# Patient Record
Sex: Female | Born: 1945 | Race: White | Hispanic: No | Smoking: Former smoker
Health system: Southern US, Community
[De-identification: ages and names within clinical notes are randomized; demographics above are authoritative.]

## PROBLEM LIST (undated history)

## (undated) DIAGNOSIS — M1990 Unspecified osteoarthritis, unspecified site: Secondary | ICD-10-CM

## (undated) DIAGNOSIS — Z972 Presence of dental prosthetic device (complete) (partial): Secondary | ICD-10-CM

## (undated) DIAGNOSIS — E538 Deficiency of other specified B group vitamins: Secondary | ICD-10-CM

## (undated) DIAGNOSIS — F209 Schizophrenia, unspecified: Secondary | ICD-10-CM

## (undated) DIAGNOSIS — J449 Chronic obstructive pulmonary disease, unspecified: Secondary | ICD-10-CM

## (undated) DIAGNOSIS — K5901 Slow transit constipation: Secondary | ICD-10-CM

## (undated) DIAGNOSIS — M810 Age-related osteoporosis without current pathological fracture: Secondary | ICD-10-CM

## (undated) DIAGNOSIS — N6019 Diffuse cystic mastopathy of unspecified breast: Secondary | ICD-10-CM

## (undated) DIAGNOSIS — T753XXA Motion sickness, initial encounter: Secondary | ICD-10-CM

## (undated) DIAGNOSIS — Z9221 Personal history of antineoplastic chemotherapy: Secondary | ICD-10-CM

## (undated) DIAGNOSIS — C189 Malignant neoplasm of colon, unspecified: Secondary | ICD-10-CM

## (undated) DIAGNOSIS — E559 Vitamin D deficiency, unspecified: Secondary | ICD-10-CM

## (undated) DIAGNOSIS — J9859 Other diseases of mediastinum, not elsewhere classified: Secondary | ICD-10-CM

## (undated) HISTORY — PX: BREAST BIOPSY: SHX20

## (undated) HISTORY — PX: COLONOSCOPY: SHX174

## (undated) HISTORY — PX: TUBAL LIGATION: SHX77

---

## 2005-07-08 ENCOUNTER — Ambulatory Visit: Payer: Self-pay | Admitting: Internal Medicine

## 2006-07-21 ENCOUNTER — Ambulatory Visit: Payer: Self-pay | Admitting: Internal Medicine

## 2006-08-01 ENCOUNTER — Ambulatory Visit: Payer: Self-pay | Admitting: Gastroenterology

## 2007-06-05 ENCOUNTER — Ambulatory Visit: Payer: Self-pay | Admitting: Nurse Practitioner

## 2008-02-22 ENCOUNTER — Ambulatory Visit: Payer: Self-pay | Admitting: Internal Medicine

## 2008-06-18 ENCOUNTER — Ambulatory Visit: Payer: Self-pay | Admitting: Family Medicine

## 2009-03-23 ENCOUNTER — Ambulatory Visit: Payer: Self-pay | Admitting: Family Medicine

## 2009-07-22 ENCOUNTER — Ambulatory Visit: Payer: Self-pay | Admitting: Family Medicine

## 2009-09-11 ENCOUNTER — Ambulatory Visit: Payer: Self-pay | Admitting: Gastroenterology

## 2010-03-26 ENCOUNTER — Ambulatory Visit: Payer: Self-pay | Admitting: Family Medicine

## 2010-03-28 ENCOUNTER — Ambulatory Visit: Payer: Self-pay | Admitting: Family Medicine

## 2011-02-27 ENCOUNTER — Ambulatory Visit: Payer: Self-pay | Admitting: Family Medicine

## 2011-03-28 ENCOUNTER — Ambulatory Visit: Payer: Self-pay | Admitting: Family Medicine

## 2012-03-31 ENCOUNTER — Ambulatory Visit: Payer: Self-pay | Admitting: Family Medicine

## 2013-04-06 ENCOUNTER — Ambulatory Visit: Payer: Self-pay | Admitting: Family Medicine

## 2013-04-13 ENCOUNTER — Ambulatory Visit: Payer: Self-pay | Admitting: Family Medicine

## 2013-05-04 ENCOUNTER — Ambulatory Visit: Payer: Self-pay | Admitting: Surgery

## 2013-05-04 HISTORY — PX: BREAST BIOPSY: SHX20

## 2013-11-09 ENCOUNTER — Ambulatory Visit: Payer: Self-pay | Admitting: Surgery

## 2014-02-16 ENCOUNTER — Ambulatory Visit: Payer: Self-pay | Admitting: Family Medicine

## 2014-04-07 ENCOUNTER — Ambulatory Visit: Payer: Self-pay | Admitting: Family Medicine

## 2014-09-05 ENCOUNTER — Ambulatory Visit: Payer: Self-pay | Admitting: Family Medicine

## 2014-09-05 LAB — CREATININE, SERUM: CREATININE: 0.74 mg/dL (ref 0.60–1.30)

## 2014-11-09 HISTORY — PX: THORACOSCOPY: SUR1347

## 2014-11-09 HISTORY — PX: MEDIASTINAL MASS EXCISION: SHX2025

## 2015-03-06 ENCOUNTER — Other Ambulatory Visit: Payer: Self-pay | Admitting: Family Medicine

## 2015-03-06 DIAGNOSIS — Z1231 Encounter for screening mammogram for malignant neoplasm of breast: Secondary | ICD-10-CM

## 2015-04-11 ENCOUNTER — Ambulatory Visit
Admission: RE | Admit: 2015-04-11 | Discharge: 2015-04-11 | Disposition: A | Discharge status: Home / Self Care | Payer: Medicare Other | Source: Ambulatory Visit | Attending: Family Medicine | Admitting: Family Medicine

## 2015-04-11 DIAGNOSIS — Z1231 Encounter for screening mammogram for malignant neoplasm of breast: Secondary | ICD-10-CM | POA: Diagnosis present

## 2016-03-07 ENCOUNTER — Other Ambulatory Visit: Payer: Self-pay | Admitting: Family Medicine

## 2016-03-07 DIAGNOSIS — Z1231 Encounter for screening mammogram for malignant neoplasm of breast: Secondary | ICD-10-CM

## 2016-03-07 DIAGNOSIS — M810 Age-related osteoporosis without current pathological fracture: Secondary | ICD-10-CM

## 2016-04-11 ENCOUNTER — Ambulatory Visit: Payer: Medicare Other

## 2016-04-22 ENCOUNTER — Ambulatory Visit
Admission: RE | Admit: 2016-04-22 | Discharge: 2016-04-22 | Disposition: A | Discharge status: Home / Self Care | Payer: Medicare Other | Source: Ambulatory Visit | Attending: Family Medicine | Admitting: Family Medicine

## 2016-04-22 DIAGNOSIS — M810 Age-related osteoporosis without current pathological fracture: Secondary | ICD-10-CM | POA: Insufficient documentation

## 2016-04-22 DIAGNOSIS — M85851 Other specified disorders of bone density and structure, right thigh: Secondary | ICD-10-CM | POA: Diagnosis not present

## 2016-04-22 DIAGNOSIS — Z780 Asymptomatic menopausal state: Secondary | ICD-10-CM | POA: Insufficient documentation

## 2016-04-22 DIAGNOSIS — Z1231 Encounter for screening mammogram for malignant neoplasm of breast: Secondary | ICD-10-CM | POA: Diagnosis not present

## 2016-11-14 ENCOUNTER — Encounter: Payer: Self-pay | Admitting: *Deleted

## 2016-11-15 NOTE — Discharge Instructions (Signed)
[facility] °[facility] ° °POST OPERATIVE INSTRUCTIONS FOR DR. TROXLER AND DR. FOWLER °[facility] ° ° °1. Take your medication as prescribed.  Pain medication should be taken only as needed. ° °2. Keep the dressing clean, dry and intact. ° °3. Keep your foot elevated above the heart level for the first 48 hours. ° °4. Walking to the bathroom and brief periods of walking are acceptable, unless we have instructed you to be non-weight bearing. ° °5. Always wear your post-op shoe when walking.  Always use your crutches if you are to be non-weight bearing. ° °6. Do not take a shower. Baths are permissible as long as the foot is kept out of the water.  ° °7. Every hour you are awake:  °- Bend your knee 15 times. °- Flex foot 15 times °- Massage calf 15 times ° °8. Call [facility] ([phone]) if any of the following problems occur: °- You develop a temperature or fever. °- The bandage becomes saturated with blood. °- Medication does not stop your pain. °- Injury of the foot occurs. °- Any symptoms of infection including redness, odor, or red streaks running from wound. ° ° °General Anesthesia, Adult, Care After °These instructions provide you with information about caring for yourself after your procedure. Your health care provider may also give you more specific instructions. Your treatment has been planned according to current medical practices, but problems sometimes occur. Call your health care provider if you have any problems or questions after your procedure. °What can I expect after the procedure? °After the procedure, it is common to have: °· Vomiting. °· A sore throat. °· Mental slowness. °It is common to feel: °· Nauseous. °· Cold or shivery. °· Sleepy. °· Tired. °· Sore or achy, even in parts of your body where you did not have surgery. °Follow these instructions at home: °For at least 24 hours after the procedure: °· Do not: °¨ Participate in  activities where you could fall or become injured. °¨ Drive. °¨ Use heavy machinery. °¨ Drink alcohol. °¨ Take sleeping pills or medicines that cause drowsiness. °¨ Make important decisions or sign legal documents. °¨ Take care of children on your own. °· Rest. °Eating and drinking °· If you vomit, drink water, juice, or soup when you can drink without vomiting. °· Drink enough fluid to keep your urine clear or pale yellow. °· Make sure you have little or no nausea before eating solid foods. °· Follow the diet recommended by your health care provider. °General instructions °· Have a responsible adult stay with you until you are awake and alert. °· Return to your normal activities as told by your health care provider. Ask your health care provider what activities are safe for you. °· Take over-the-counter and prescription medicines only as told by your health care provider. °· If you smoke, do not smoke without supervision. °· Keep all follow-up visits as told by your health care provider. This is important. °Contact a health care provider if: °· You continue to have nausea or vomiting at home, and medicines are not helpful. °· You cannot drink fluids or start eating again. °· You cannot urinate after 8-12 hours. °· You develop a skin rash. °· You have fever. °· You have increasing redness at the site of your procedure. °Get help right away if: °· You have difficulty breathing. °· You have chest pain. °· You have unexpected bleeding. °· You feel that you are having   a life-threatening or urgent problem. °This information is not intended to replace advice given to you by your health care provider. Make sure you discuss any questions you have with your health care provider. °Document Released: 01/13/2001 Document Revised: 03/11/2016 Document Reviewed: 09/21/2015 °Elsevier Interactive Patient Education © 2017 [employer] ° °

## 2016-11-20 ENCOUNTER — Encounter
Admission: RE | Disposition: A | Discharge status: Home / Self Care | Payer: Self-pay | Source: Ambulatory Visit | Attending: Podiatry

## 2016-11-20 ENCOUNTER — Ambulatory Visit: Payer: Medicare Other | Admitting: Anesthesiology

## 2016-11-20 ENCOUNTER — Ambulatory Visit
Admission: RE | Admit: 2016-11-20 | Discharge: 2016-11-20 | Disposition: A | Discharge status: Home / Self Care | Payer: Medicare Other | Source: Ambulatory Visit | Attending: Podiatry | Admitting: Podiatry

## 2016-11-20 DIAGNOSIS — Z79899 Other long term (current) drug therapy: Secondary | ICD-10-CM | POA: Insufficient documentation

## 2016-11-20 DIAGNOSIS — F209 Schizophrenia, unspecified: Secondary | ICD-10-CM | POA: Insufficient documentation

## 2016-11-20 DIAGNOSIS — Z87891 Personal history of nicotine dependence: Secondary | ICD-10-CM | POA: Insufficient documentation

## 2016-11-20 DIAGNOSIS — M2011 Hallux valgus (acquired), right foot: Secondary | ICD-10-CM | POA: Insufficient documentation

## 2016-11-20 DIAGNOSIS — Z7982 Long term (current) use of aspirin: Secondary | ICD-10-CM | POA: Diagnosis not present

## 2016-11-20 HISTORY — DX: Unspecified osteoarthritis, unspecified site: M19.90

## 2016-11-20 HISTORY — PX: HALLUX VALGUS AKIN: SHX6622

## 2016-11-20 HISTORY — DX: Presence of dental prosthetic device (complete) (partial): Z97.2

## 2016-11-20 HISTORY — DX: Age-related osteoporosis without current pathological fracture: M81.0

## 2016-11-20 HISTORY — DX: Other diseases of mediastinum, not elsewhere classified: J98.59

## 2016-11-20 HISTORY — DX: Motion sickness, initial encounter: T75.3XXA

## 2016-11-20 HISTORY — PX: HALLUX VALGUS AUSTIN: SHX6623

## 2016-11-20 HISTORY — DX: Schizophrenia, unspecified: F20.9

## 2016-11-20 SURGERY — CORRECTION, HALLUX VALGUS
Anesthesia: Regional | Site: Foot | Laterality: Right | Wound class: Clean

## 2016-11-20 MED ORDER — EPHEDRINE SULFATE 50 MG/ML IJ SOLN
INTRAMUSCULAR | Status: DC | PRN
Start: 2016-11-20 — End: 2016-11-20
  Administered 2016-11-20 (×2): 5 mg via INTRAVENOUS

## 2016-11-20 MED ORDER — LIDOCAINE HCL (CARDIAC) 20 MG/ML IV SOLN
INTRAVENOUS | Status: DC | PRN
  Administered 2016-11-20: 40 mg via INTRATRACHEAL

## 2016-11-20 MED ORDER — ONDANSETRON HCL 4 MG/2ML IJ SOLN: 4.0000 mg | Freq: Four times a day (QID) | INTRAMUSCULAR | Status: DC | PRN

## 2016-11-20 MED ORDER — DEXAMETHASONE SODIUM PHOSPHATE 4 MG/ML IJ SOLN
INTRAMUSCULAR | Status: DC | PRN
  Administered 2016-11-20: 4 mg via INTRAVENOUS

## 2016-11-20 MED ORDER — FENTANYL CITRATE (PF) 100 MCG/2ML IJ SOLN
INTRAMUSCULAR | Status: DC | PRN
  Administered 2016-11-20: 100 ug via INTRAVENOUS

## 2016-11-20 MED ORDER — OXYCODONE-ACETAMINOPHEN 5-325 MG PO TABS: 1.0000 | ORAL_TABLET | ORAL | Status: DC | PRN

## 2016-11-20 MED ORDER — MIDAZOLAM HCL 2 MG/2ML IJ SOLN
INTRAMUSCULAR | Status: DC | PRN
  Administered 2016-11-20: 1 mg via INTRAVENOUS
  Administered 2016-11-20: 2 mg via INTRAVENOUS

## 2016-11-20 MED ORDER — BUPIVACAINE HCL (PF) 0.25 % IJ SOLN
INTRAMUSCULAR | Status: DC | PRN
  Administered 2016-11-20: 9 mL

## 2016-11-20 MED ORDER — HYDROCODONE-ACETAMINOPHEN 5-325 MG PO TABS: 1.0000 | ORAL_TABLET | Freq: Four times a day (QID) | ORAL | 0 refills | Status: DC | PRN

## 2016-11-20 MED ORDER — LACTATED RINGERS IV SOLN
INTRAVENOUS | Status: DC
  Administered 2016-11-20: 13:00:00 via INTRAVENOUS

## 2016-11-20 MED ORDER — ONDANSETRON HCL 4 MG/2ML IJ SOLN
INTRAMUSCULAR | Status: DC | PRN
  Administered 2016-11-20: 4 mg via INTRAVENOUS

## 2016-11-20 MED ORDER — ROPIVACAINE HCL 5 MG/ML IJ SOLN
INTRAMUSCULAR | Status: DC | PRN
  Administered 2016-11-20: 40 mL

## 2016-11-20 MED ORDER — DEXTROSE 5 % IV SOLN
600.0000 mg | Freq: Once | INTRAVENOUS | Status: AC
  Administered 2016-11-20: 600 mg via INTRAVENOUS

## 2016-11-20 MED ORDER — ONDANSETRON HCL 4 MG PO TABS: 4.0000 mg | ORAL_TABLET | Freq: Four times a day (QID) | ORAL | Status: DC | PRN

## 2016-11-20 MED ORDER — GLYCOPYRROLATE 0.2 MG/ML IJ SOLN
INTRAMUSCULAR | Status: DC | PRN
  Administered 2016-11-20: 0.1 mg via INTRAVENOUS

## 2016-11-20 MED ORDER — PROPOFOL 10 MG/ML IV BOLUS
INTRAVENOUS | Status: DC | PRN
  Administered 2016-11-20: 140 mg via INTRAVENOUS

## 2016-11-20 SURGICAL SUPPLY — 48 items
BANDAGE ELASTIC 4 VELCRO NS (GAUZE/BANDAGES/DRESSINGS) ×2 IMPLANT
BENZOIN TINCTURE PRP APPL 2/3 (GAUZE/BANDAGES/DRESSINGS) ×2 IMPLANT
BLADE MED AGGRESSIVE (BLADE) ×2 IMPLANT
BLADE OSC/SAGITTAL MD 5.5X18 (BLADE) IMPLANT
BLADE OSC/SAGITTAL MD 9X18.5 (BLADE) ×2 IMPLANT
BLADE SURG 15 STRL LF DISP TIS (BLADE) IMPLANT
BLADE SURG 15 STRL SS (BLADE)
BNDG COHESIVE 4X5 TAN STRL (GAUZE/BANDAGES/DRESSINGS) ×2 IMPLANT
BNDG ESMARK 4X12 TAN STRL LF (GAUZE/BANDAGES/DRESSINGS) ×2 IMPLANT
BNDG GAUZE 4.5X4.1 6PLY STRL (MISCELLANEOUS) ×2 IMPLANT
BNDG STRETCH 4X75 STRL LF (GAUZE/BANDAGES/DRESSINGS) ×2 IMPLANT
CANISTER SUCT 1200ML W/VALVE (MISCELLANEOUS) ×2 IMPLANT
COVER LIGHT HANDLE UNIVERSAL (MISCELLANEOUS) ×4 IMPLANT
CUFF TOURN SGL QUICK 18 (TOURNIQUET CUFF) IMPLANT
DRAPE FLUOR MINI C-ARM 54X84 (DRAPES) ×2 IMPLANT
DURAPREP 26ML APPLICATOR (WOUND CARE) ×2 IMPLANT
GAUZE PETRO XEROFOAM 1X8 (MISCELLANEOUS) ×2 IMPLANT
GAUZE SPONGE 4X4 12PLY STRL (GAUZE/BANDAGES/DRESSINGS) ×2 IMPLANT
GLOVE BIO SURGEON STRL SZ7.5 (GLOVE) ×4 IMPLANT
GLOVE INDICATOR 8.0 STRL GRN (GLOVE) ×4 IMPLANT
GOWN STRL REUS W/ TWL LRG LVL3 (GOWN DISPOSABLE) ×2 IMPLANT
GOWN STRL REUS W/TWL LRG LVL3 (GOWN DISPOSABLE) ×2
K-WIRE (Wire) ×2 IMPLANT
K-WIRE DBL END TROCAR 6X.045 (WIRE) ×2
K-WIRE DBL END TROCAR 6X.062 (WIRE) ×2
KIT ROOM TURNOVER OR (KITS) ×2 IMPLANT
KWIRE DBL END TROCAR 6X.045 (WIRE) ×1 IMPLANT
KWIRE DBL END TROCAR 6X.062 (WIRE) ×1 IMPLANT
NS IRRIG 500ML POUR BTL (IV SOLUTION) ×2 IMPLANT
PACK EXTREMITY ARMC (MISCELLANEOUS) ×2 IMPLANT
PAD GROUND ADULT SPLIT (MISCELLANEOUS) ×2 IMPLANT
PIN BALLS 3/8 F/.045 WIRE (MISCELLANEOUS) IMPLANT
RASP SM TEAR CROSS CUT (RASP) IMPLANT
STAPLE SPR MET 9X9X9 1.5 (Staple) ×2 IMPLANT
STOCKINETTE IMPERVIOUS LG (DRAPES) ×2 IMPLANT
STRAP BODY AND KNEE 60X3 (MISCELLANEOUS) ×2 IMPLANT
STRIP CLOSURE SKIN 1/4X4 (GAUZE/BANDAGES/DRESSINGS) ×2 IMPLANT
SUT ETHILON 4-0 (SUTURE)
SUT ETHILON 4-0 FS2 18XMFL BLK (SUTURE)
SUT ETHILON 5-0 FS-2 18 BLK (SUTURE) IMPLANT
SUT MNCRL 5-0+ PC-1 (SUTURE) ×1 IMPLANT
SUT MONOCRYL 5-0 (SUTURE) ×1
SUT VIC AB 2-0 SH 27 (SUTURE)
SUT VIC AB 2-0 SH 27XBRD (SUTURE) IMPLANT
SUT VIC AB 3-0 SH 27 (SUTURE)
SUT VIC AB 3-0 SH 27X BRD (SUTURE) IMPLANT
SUT VIC AB 4-0 FS2 27 (SUTURE) ×2 IMPLANT
SUTURE ETHLN 4-0 FS2 18XMF BLK (SUTURE) IMPLANT

## 2016-11-20 NOTE — Op Note (Signed)
Operative note   Surgeon:Samit Sylve Lawyer: None    Preop diagnosis: 1 hallux valgus right foot    Postop diagnosis: Hallux valgus right foot    Procedure: Austin with Akin double osteotomy hallux valgus correction    EBL: Minimal    Anesthesia:local and general    Hemostasis: Midcalf tourniquet inflated to 200 mmHg for approximately 40 minutes    Specimen: None    Complications: None    Operative indications:Kathy [name] is an 71 y.o. that presents today for surgical intervention.  The risks/benefits/alternatives/complications have been discussed and consent has been given.    Procedure:  Patient was brought into the OR and placed on the operating table in thesupine position. After anesthesia was obtained theright lower extremity was prepped and draped in usual sterile fashion.  Attention was directed to the dorsomedial right foot where a longitudinal incision made overlying the dorsal medial first MTPJ. Sharp and blunt dissection carried down to the capsule. A T capsulotomy was performed. The prominent dorsal medial eminence was noted and transected with a power saw. A V osteotomy was created. The head of the metatarsal was translated laterally. This was stabilized with a 0.062 K wire. The ensuing overhanging ledge was then transected and all areas were smoothed with a power rasp. At this time there was no debris residual valgus of the great toe. Attention was directed to the proximal phalanx where a small wedge with the apex lateral was placed into the proximal phalanx. The small wedge of bone was then resected and removed from the surgical field. This was then stabilized with a small bone staple. Good alignment was noted in all planes at this time. The wound was flushed with copious amounts or irrigation. Layered closure was performed with 3-0 Vicryl for the capsule. 4-0 Vicryl for the subcutaneous tissue and a 5-0 Monocryl undyed for skin. 0.25% Marcaine was placed  around all areas. Layered dressing was then applied.    Patient tolerated the procedure and anesthesia well.  Was transported from the OR to the PACU with all vital signs stable and vascular status intact. To be discharged per routine protocol.  Will follow up in approximately 1 week in the outpatient clinic.

## 2016-11-20 NOTE — Anesthesia Procedure Notes (Signed)
Procedure Name: LMA Insertion Date/Time: 11/20/2016 2:57 PM Performed by: Mayme Genta Pre-anesthesia Checklist: Patient identified, Emergency Drugs available, Suction available, Timeout performed and Patient being monitored Patient Re-evaluated:Patient Re-evaluated prior to inductionOxygen Delivery Method: Circle system utilized Preoxygenation: Pre-oxygenation with 100% oxygen Intubation Type: IV induction LMA: LMA inserted LMA Size: 4.0 Number of attempts: 1 Placement Confirmation: positive ETCO2 and breath sounds checked- equal and bilateral Tube secured with: Tape

## 2016-11-20 NOTE — Anesthesia Postprocedure Evaluation (Signed)
Anesthesia Post Note  Patient: Kathy [name]  Procedure(s) Performed: Procedure(s) (LRB): HALLUX VALGUS AUSTIN  Alroy Dust) right (Right) HALLUX VALGUS AKIN (Right)  Patient location during evaluation: PACU Anesthesia Type: Regional and General Level of consciousness: awake and alert Pain management: pain level controlled Vital Signs Assessment: post-procedure vital signs reviewed and stable Respiratory status: spontaneous breathing, nonlabored ventilation, respiratory function stable and patient connected to nasal cannula oxygen Cardiovascular status: blood pressure returned to baseline and stable Postop Assessment: no signs of nausea or vomiting Anesthetic complications: no    Alisa Graff

## 2016-11-20 NOTE — H&P (Signed)
HISTORY AND PHYSICAL INTERVAL NOTE:  11/20/2016  2:34 PM  Kathy [name]  has presented today for surgery, with the diagnosis of M20.11 Hallux Valgus Right.  The various methods of treatment have been discussed with the patient.  No guarantees were given.  After consideration of risks, benefits and other options for treatment, the patient has consented to surgery.  I have reviewed the patients' chart and labs.    Patient Vitals for the past 24 hrs:  BP Temp Temp src Pulse Resp SpO2 Weight  11/20/16 1315 113/60 - - 66 19 100 % -  11/20/16 1310 (!) 112/57 - - 64 16 100 % -  11/20/16 1305 (!) 144/71 - - 64 18 100 % -  11/20/16 1249 (!) 143/63 98.1 F (36.7 C) Temporal 73 - 99 % 66.2 kg (146 lb)    A history and physical examination was performed in my office.  The patient was reexamined.  There have been no changes to this history and physical examination.  Samara Deist A

## 2016-11-20 NOTE — Anesthesia Procedure Notes (Signed)
Anesthesia Regional Block:  Ankle block  Pre-Anesthetic Checklist: ,, timeout performed, Correct Patient, Correct Site, Correct Laterality, Correct Procedure, Correct Position, site marked, Risks and benefits discussed,  Surgical consent,  Pre-op evaluation,  At surgeon's request and post-op pain management  Laterality: Right  Prep: chloraprep       Needles:  Injection technique: Single-shot      Needle Gauge: 25 and 25 G    Additional Needles:  Procedures: ultrasound guided (picture in chart) Ankle block Narrative:  Start time: 11/20/2016 1:05 PM End time: 11/20/2016 1:20 PM Injection made incrementally with aspirations every 5 mL.  Performed by: Personally   Additional Notes: A functioning IV was confirmed and monitors were applied.  Sterile prep and drape, hand hygiene and sterile gloves were used.  Negative aspiration and test dose prior to incremental administration of local anesthetic using the 25 ga needle. 5 ports used.  The patient tolerated the procedure well. Vitals signes recorded in RN notes.

## 2016-11-20 NOTE — Anesthesia Preprocedure Evaluation (Signed)
Anesthesia Evaluation  Patient identified by MRN, date of birth, ID band Patient awake    Reviewed: Allergy & Precautions, H&P , NPO status , Patient's Chart, lab work & pertinent test results, reviewed documented beta blocker date and time   Airway Mallampati: II  TM Distance: >3 FB Neck ROM: full    Dental  (+) Partial Upper, Partial Lower   Pulmonary former smoker,    Pulmonary exam normal breath sounds clear to auscultation       Cardiovascular Exercise Tolerance: Good negative cardio ROS   Rhythm:regular Rate:Normal     Neuro/Psych PSYCHIATRIC DISORDERS (schizophrenia) negative neurological ROS     GI/Hepatic negative GI ROS, Neg liver ROS,   Endo/Other  negative endocrine ROS  Renal/GU negative Renal ROS  negative genitourinary   Musculoskeletal   Abdominal   Peds  Hematology negative hematology ROS (+)   Anesthesia Other Findings   Reproductive/Obstetrics negative OB ROS                             Anesthesia Physical Anesthesia Plan  ASA: II  Anesthesia Plan: Regional and General LMA   Post-op Pain Management: GA combined w/ Regional for post-op pain   Induction:   Airway Management Planned:   Additional Equipment:   Intra-op Plan:   Post-operative Plan:   Informed Consent: I have reviewed the patients History and Physical, chart, labs and discussed the procedure including the risks, benefits and alternatives for the proposed anesthesia with the patient or authorized representative who has indicated his/her understanding and acceptance.   Dental Advisory Given  Plan Discussed with: CRNA  Anesthesia Plan Comments:         Anesthesia Quick Evaluation

## 2016-11-20 NOTE — Transfer of Care (Signed)
Immediate Anesthesia Transfer of Care Note  Patient: Kathy [name]  Procedure(s) Performed: Procedure(s) with comments: [place]  Alroy Dust) right (Right) - IVA with Popliteal HALLUX VALGUS AKIN (Right)  Patient Location: PACU  Anesthesia Type: Regional, General LMA  Level of Consciousness: awake, alert  and patient cooperative  Airway and Oxygen Therapy: Patient Spontanous Breathing and Patient connected to supplemental oxygen  Post-op Assessment: Post-op Vital signs reviewed, Patient's Cardiovascular Status Stable, Respiratory Function Stable, Patent Airway and No signs of Nausea or vomiting  Post-op Vital Signs: Reviewed and stable  Complications: No apparent anesthesia complications

## 2017-01-08 ENCOUNTER — Encounter: Payer: Self-pay | Admitting: *Deleted

## 2017-01-14 NOTE — Discharge Instructions (Signed)
[facility] °[facility] ° °POST OPERATIVE INSTRUCTIONS FOR DR. TROXLER AND DR. FOWLER °[facility] ° ° °1. Take your medication as prescribed.  Pain medication should be taken only as needed. ° °2. Keep the dressing clean, dry and intact. ° °3. Keep your foot elevated above the heart level for the first 48 hours. ° °4. Walking to the bathroom and brief periods of walking are acceptable, unless we have instructed you to be non-weight bearing. ° °5. Always wear your post-op shoe when walking.  Always use your crutches if you are to be non-weight bearing. ° °6. Do not take a shower. Baths are permissible as long as the foot is kept out of the water.  ° °7. Every hour you are awake:  °- Bend your knee 15 times. °- Flex foot 15 times °- Massage calf 15 times ° °8. Call [facility] ([phone]) if any of the following problems occur: °- You develop a temperature or fever. °- The bandage becomes saturated with blood. °- Medication does not stop your pain. °- Injury of the foot occurs. °- Any symptoms of infection including redness, odor, or red streaks running from wound. ° ° °General Anesthesia, Adult, Care After °These instructions provide you with information about caring for yourself after your procedure. Your health care provider may also give you more specific instructions. Your treatment has been planned according to current medical practices, but problems sometimes occur. Call your health care provider if you have any problems or questions after your procedure. °What can I expect after the procedure? °After the procedure, it is common to have: °· Vomiting. °· A sore throat. °· Mental slowness. °It is common to feel: °· Nauseous. °· Cold or shivery. °· Sleepy. °· Tired. °· Sore or achy, even in parts of your body where you did not have surgery. °Follow these instructions at home: °For at least 24 hours after the procedure: °· Do not: °¨ Participate in  activities where you could fall or become injured. °¨ Drive. °¨ Use heavy machinery. °¨ Drink alcohol. °¨ Take sleeping pills or medicines that cause drowsiness. °¨ Make important decisions or sign legal documents. °¨ Take care of children on your own. °· Rest. °Eating and drinking °· If you vomit, drink water, juice, or soup when you can drink without vomiting. °· Drink enough fluid to keep your urine clear or pale yellow. °· Make sure you have little or no nausea before eating solid foods. °· Follow the diet recommended by your health care provider. °General instructions °· Have a responsible adult stay with you until you are awake and alert. °· Return to your normal activities as told by your health care provider. Ask your health care provider what activities are safe for you. °· Take over-the-counter and prescription medicines only as told by your health care provider. °· If you smoke, do not smoke without supervision. °· Keep all follow-up visits as told by your health care provider. This is important. °Contact a health care provider if: °· You continue to have nausea or vomiting at home, and medicines are not helpful. °· You cannot drink fluids or start eating again. °· You cannot urinate after 8-12 hours. °· You develop a skin rash. °· You have fever. °· You have increasing redness at the site of your procedure. °Get help right away if: °· You have difficulty breathing. °· You have chest pain. °· You have unexpected bleeding. °· You feel that you are having   a life-threatening or urgent problem. °This information is not intended to replace advice given to you by your health care provider. Make sure you discuss any questions you have with your health care provider. °Document Released: 01/13/2001 Document Revised: 03/11/2016 Document Reviewed: 09/21/2015 °Elsevier Interactive Patient Education © 2017 [employer] Inc. ° °

## 2017-01-15 ENCOUNTER — Ambulatory Visit
Admission: RE | Admit: 2017-01-15 | Discharge: 2017-01-15 | Disposition: A | Discharge status: Home / Self Care | Payer: Medicare Other | Source: Ambulatory Visit | Attending: Podiatry | Admitting: Podiatry

## 2017-01-15 ENCOUNTER — Encounter: Payer: Self-pay | Admitting: *Deleted

## 2017-01-15 ENCOUNTER — Ambulatory Visit: Payer: Medicare Other | Admitting: Anesthesiology

## 2017-01-15 ENCOUNTER — Encounter
Admission: RE | Disposition: A | Discharge status: Home / Self Care | Payer: Self-pay | Source: Ambulatory Visit | Attending: Podiatry

## 2017-01-15 DIAGNOSIS — Z87891 Personal history of nicotine dependence: Secondary | ICD-10-CM | POA: Diagnosis not present

## 2017-01-15 DIAGNOSIS — Z79899 Other long term (current) drug therapy: Secondary | ICD-10-CM | POA: Diagnosis not present

## 2017-01-15 DIAGNOSIS — F209 Schizophrenia, unspecified: Secondary | ICD-10-CM | POA: Insufficient documentation

## 2017-01-15 DIAGNOSIS — M2012 Hallux valgus (acquired), left foot: Secondary | ICD-10-CM | POA: Insufficient documentation

## 2017-01-15 HISTORY — PX: HALLUX VALGUS AUSTIN: SHX6623

## 2017-01-15 SURGERY — CORRECTION, HALLUX VALGUS
Anesthesia: Regional | Laterality: Left | Wound class: Clean

## 2017-01-15 MED ORDER — FENTANYL CITRATE (PF) 100 MCG/2ML IJ SOLN
INTRAMUSCULAR | Status: DC | PRN
  Administered 2017-01-15: 50 ug via INTRAVENOUS

## 2017-01-15 MED ORDER — ONDANSETRON HCL 4 MG/2ML IJ SOLN
4.0000 mg | Freq: Four times a day (QID) | INTRAMUSCULAR | Status: DC | PRN
  Administered 2017-01-15: 4 mg via INTRAVENOUS

## 2017-01-15 MED ORDER — CLINDAMYCIN PHOSPHATE 600 MG/50ML IV SOLN
600.0000 mg | Freq: Once | INTRAVENOUS | Status: AC
  Administered 2017-01-15: 600 mg via INTRAVENOUS

## 2017-01-15 MED ORDER — PROMETHAZINE HCL 25 MG/ML IJ SOLN: 6.2500 mg | INTRAMUSCULAR | Status: DC | PRN

## 2017-01-15 MED ORDER — FENTANYL CITRATE (PF) 100 MCG/2ML IJ SOLN: 25.0000 ug | INTRAMUSCULAR | Status: DC | PRN

## 2017-01-15 MED ORDER — MIDAZOLAM HCL 5 MG/5ML IJ SOLN
INTRAMUSCULAR | Status: DC | PRN
  Administered 2017-01-15 (×2): 1 mg via INTRAVENOUS

## 2017-01-15 MED ORDER — LACTATED RINGERS IV SOLN
INTRAVENOUS | Status: DC
  Administered 2017-01-15: 11:00:00 via INTRAVENOUS

## 2017-01-15 MED ORDER — LIDOCAINE HCL (CARDIAC) 20 MG/ML IV SOLN
INTRAVENOUS | Status: DC | PRN
  Administered 2017-01-15: 40 mg via INTRAVENOUS

## 2017-01-15 MED ORDER — OXYCODONE-ACETAMINOPHEN 5-325 MG PO TABS: 1.0000 | ORAL_TABLET | ORAL | 0 refills | Status: DC | PRN

## 2017-01-15 MED ORDER — PROPOFOL 500 MG/50ML IV EMUL
INTRAVENOUS | Status: DC | PRN
  Administered 2017-01-15: 50 ug/kg/min via INTRAVENOUS

## 2017-01-15 MED ORDER — ONDANSETRON HCL 4 MG PO TABS: 4.0000 mg | ORAL_TABLET | Freq: Four times a day (QID) | ORAL | Status: DC | PRN

## 2017-01-15 MED ORDER — OXYCODONE-ACETAMINOPHEN 5-325 MG PO TABS: 1.0000 | ORAL_TABLET | ORAL | Status: DC | PRN

## 2017-01-15 MED ORDER — BUPIVACAINE HCL (PF) 0.25 % IJ SOLN
INTRAMUSCULAR | Status: DC | PRN
  Administered 2017-01-15: 10 mL

## 2017-01-15 SURGICAL SUPPLY — 42 items
BANDAGE ELASTIC 4 VELCRO NS (GAUZE/BANDAGES/DRESSINGS) ×2 IMPLANT
BENZOIN TINCTURE PRP APPL 2/3 (GAUZE/BANDAGES/DRESSINGS) ×2 IMPLANT
BLADE MED AGGRESSIVE (BLADE) ×2 IMPLANT
BLADE OSC/SAGITTAL MD 5.5X18 (BLADE) IMPLANT
BLADE SURG 15 STRL LF DISP TIS (BLADE) IMPLANT
BLADE SURG 15 STRL SS (BLADE)
BNDG COHESIVE 4X5 TAN STRL (GAUZE/BANDAGES/DRESSINGS) ×2 IMPLANT
BNDG ESMARK 4X12 TAN STRL LF (GAUZE/BANDAGES/DRESSINGS) ×2 IMPLANT
BNDG GAUZE 4.5X4.1 6PLY STRL (MISCELLANEOUS) ×2 IMPLANT
BNDG STRETCH 4X75 STRL LF (GAUZE/BANDAGES/DRESSINGS) ×2 IMPLANT
CANISTER SUCT 1200ML W/VALVE (MISCELLANEOUS) ×2 IMPLANT
COVER LIGHT HANDLE UNIVERSAL (MISCELLANEOUS) ×4 IMPLANT
CUFF TOURN SGL QUICK 18 (TOURNIQUET CUFF) ×2 IMPLANT
DRAPE FLUOR MINI C-ARM 54X84 (DRAPES) ×2 IMPLANT
DURAPREP 26ML APPLICATOR (WOUND CARE) ×2 IMPLANT
GAUZE PETRO XEROFOAM 1X8 (MISCELLANEOUS) ×2 IMPLANT
GAUZE SPONGE 4X4 12PLY STRL (GAUZE/BANDAGES/DRESSINGS) ×2 IMPLANT
GLOVE BIO SURGEON STRL SZ7.5 (GLOVE) ×2 IMPLANT
GLOVE INDICATOR 8.0 STRL GRN (GLOVE) ×2 IMPLANT
GOWN STRL REUS W/ TWL LRG LVL3 (GOWN DISPOSABLE) ×2 IMPLANT
GOWN STRL REUS W/TWL LRG LVL3 (GOWN DISPOSABLE) ×2
K-WIRE DBL END TROCAR 6X.045 (WIRE) ×2
K-WIRE DBL END TROCAR 6X.062 (WIRE) ×2
K-wire 0.062 (Wire) ×2 IMPLANT
KIT ROOM TURNOVER OR (KITS) ×2 IMPLANT
KWIRE DBL END TROCAR 6X.045 (WIRE) ×1 IMPLANT
KWIRE DBL END TROCAR 6X.062 (WIRE) ×1 IMPLANT
NS IRRIG 500ML POUR BTL (IV SOLUTION) ×2 IMPLANT
PACK EXTREMITY ARMC (MISCELLANEOUS) ×2 IMPLANT
PAD GROUND ADULT SPLIT (MISCELLANEOUS) ×2 IMPLANT
RASP SM TEAR CROSS CUT (RASP) ×2 IMPLANT
STAPLE SPR MET 9X9X9 1.5 (Staple) ×2 IMPLANT
STOCKINETTE IMPERVIOUS LG (DRAPES) IMPLANT
STRAP BODY AND KNEE 60X3 (MISCELLANEOUS) ×2 IMPLANT
STRIP CLOSURE SKIN 1/4X4 (GAUZE/BANDAGES/DRESSINGS) ×2 IMPLANT
SUT MNCRL 5-0+ PC-1 (SUTURE) ×1 IMPLANT
SUT MONOCRYL 5-0 (SUTURE) ×1
SUT VIC AB 2-0 SH 27 (SUTURE)
SUT VIC AB 2-0 SH 27XBRD (SUTURE) IMPLANT
SUT VIC AB 3-0 SH 27 (SUTURE)
SUT VIC AB 3-0 SH 27X BRD (SUTURE) IMPLANT
SUT VIC AB 4-0 FS2 27 (SUTURE) ×2 IMPLANT

## 2017-01-15 NOTE — Anesthesia Procedure Notes (Signed)
Anesthesia Regional Block: Popliteal block   Pre-Anesthetic Checklist: ,, timeout performed, Correct Patient, Correct Site, Correct Laterality, Correct Procedure, Correct Position, site marked, Risks and benefits discussed,  Surgical consent,  Pre-op evaluation,  At surgeon's request and post-op pain management  Laterality: Left  Prep: chloraprep       Needles:  Injection technique: Single-shot  Needle Type: Echogenic Needle     Needle Length: 9cm  Needle Gauge: 21     Additional Needles:   Procedures: ultrasound guided,,,,,,,,  Narrative:  Start time: 01/15/2017 11:28 AM End time: 01/15/2017 11:34 AM Injection made incrementally with aspirations every 5 mL.  Performed by: Personally   Additional Notes: Functioning IV was confirmed and monitors applied. Ultrasound guidance: relevant anatomy identified, needle position confirmed, local anesthetic spread visualized around nerve(s)., vascular puncture avoided.  Image printed for medical record.  Negative aspiration and no paresthesias; incremental administration of local anesthetic. The patient tolerated the procedure well. Vitals signes recorded in RN notes.

## 2017-01-15 NOTE — Transfer of Care (Signed)
Immediate Anesthesia Transfer of Care Note  Patient: Kathy [name]  Procedure(s) Performed: Procedure(s) with comments: HALLUX VALGUS AUSTIN  Correction left foot  Iva Popiteal (Left) - IVA Popliteal   Patient Location: PACU  Anesthesia Type: Regional, General LMA  Level of Consciousness: awake, alert  and patient cooperative  Airway and Oxygen Therapy: Patient Spontanous Breathing and Patient connected to supplemental oxygen  Post-op Assessment: Post-op Vital signs reviewed, Patient's Cardiovascular Status Stable, Respiratory Function Stable, Patent Airway and No signs of Nausea or vomiting  Post-op Vital Signs: Reviewed and stable  Complications: No apparent anesthesia complications

## 2017-01-15 NOTE — Anesthesia Preprocedure Evaluation (Signed)
Anesthesia Evaluation  Patient identified by MRN, date of birth, ID band Patient awake    Reviewed: Allergy & Precautions, H&P , NPO status , Patient's Chart, lab work & pertinent test results, reviewed documented beta blocker date and time   Airway Mallampati: II  TM Distance: >3 FB Neck ROM: Full    Dental  (+) Partial Upper, Partial Lower   Pulmonary former smoker,    Pulmonary exam normal breath sounds clear to auscultation       Cardiovascular Exercise Tolerance: Good negative cardio ROS Normal cardiovascular exam Rhythm:regular Rate:Normal     Neuro/Psych PSYCHIATRIC DISORDERS Schizophrenia negative neurological ROS     GI/Hepatic negative GI ROS, Neg liver ROS,   Endo/Other  negative endocrine ROS  Renal/GU negative Renal ROS  negative genitourinary   Musculoskeletal  (+) Arthritis , Osteoarthritis,    Abdominal   Peds  Hematology negative hematology ROS (+)   Anesthesia Other Findings   Reproductive/Obstetrics negative OB ROS                             Anesthesia Physical  Anesthesia Plan  ASA: II  Anesthesia Plan: Regional and General LMA   Post-op Pain Management: GA combined w/ Regional for post-op pain   Induction:   Airway Management Planned:   Additional Equipment:   Intra-op Plan:   Post-operative Plan:   Informed Consent: I have reviewed the patients History and Physical, chart, labs and discussed the procedure including the risks, benefits and alternatives for the proposed anesthesia with the patient or authorized representative who has indicated his/her understanding and acceptance.   Dental Advisory Given  Plan Discussed with: CRNA  Anesthesia Plan Comments:         Anesthesia Quick Evaluation

## 2017-01-15 NOTE — Anesthesia Postprocedure Evaluation (Signed)
Anesthesia Post Note  Patient: Kathy [name]  Procedure(s) Performed: Procedure(s) (LRB): HALLUX VALGUS AUSTIN  Correction left foot  Iva Popiteal (Left)  Patient location during evaluation: PACU Anesthesia Type: Regional Level of consciousness: awake and alert Pain management: pain level controlled Vital Signs Assessment: post-procedure vital signs reviewed and stable Respiratory status: spontaneous breathing, nonlabored ventilation, respiratory function stable and patient connected to nasal cannula oxygen Cardiovascular status: stable and blood pressure returned to baseline Anesthetic complications: no    Marshell Levan

## 2017-01-15 NOTE — H&P (Signed)
  HISTORY AND PHYSICAL INTERVAL NOTE:  01/15/2017  11:53 AM  Kathy [name]  has presented today for surgery, with the diagnosis of M20.12 hallux Valgus Left foot.  The various methods of treatment have been discussed with the patient.  No guarantees were given.  After consideration of risks, benefits and other options for treatment, the patient has consented to surgery.  I have reviewed the patients' chart and labs.    Patient Vitals for the past 24 hrs:  BP Temp Pulse Resp SpO2 Height Weight  01/15/17 1135 (!) 114/53 - (!) 59 18 100 % - -  01/15/17 1130 113/63 - 62 (!) 21 100 % - -  01/15/17 1034 130/62 97.9 F (36.6 C) 64 16 100 % 5\' 4"  (1.626 m) 67.1 kg (148 lb)    [name] history and physical examination was performed in my office.  The patient was reexamined.  There have been no changes to this history and physical examination.  Kathy [name]

## 2017-01-15 NOTE — Progress Notes (Signed)
Assisted Scott Mculloch ANMD with left, ultrasound guided, popliteal block. Side rails up, monitors on throughout procedure. See vital signs in flow sheet. Tolerated Procedure well.

## 2017-01-15 NOTE — Op Note (Signed)
Operative note   Surgeon:Ewa Hipp Lawyer: None    Preop diagnosis: Left foot hallux valgus    Postop diagnosis: Same    Procedure: Austin/Akin double osteotomy left foot hallux valgus correction    EBL: Minimal    Anesthesia:regional and IV sedation    Hemostasis: Midcalf tourniquet inflated to 200 mmHg for approximately 50 minutes    Specimen: None    Complications: None    Operative indications:Kathy [name] is an 71 y.o. that presents today for surgical intervention.  The risks/benefits/alternatives/complications have been discussed and consent has been given.    Procedure:  Patient was brought into the OR and placed on the operating table in thesupine position. After anesthesia was obtained theleft lower extremity was prepped and draped in usual sterile fashion.  Attention was directed to the left first MTPJ where a dorsal medial incision was performed. Sharp and blunt dissection carried down to the capsule. A T capsulotomy was then performed. The dorsomedial eminence was transected with a power saw and smoothed with a power rasp. A 0.062 K wire was then placed from medial to lateral. A V osteotomy was then created and the capital fragment was translocated laterally. This was stabilized with a 0.062 K wire. The ensuing overhanging ledge was transected. All areas were smoothed with a power rasp. Good alignment of the first MTPJ was noted with mild residual valgus of the great toe. At this time an Akin osteotomy was formed. Attention was directed to the base of the proximal phalanx where a 0.045 guidewire was used for the apical axis guide. A dorsal to plantar V wedge of bone was removed from the base of the proximal phalanx. The base was noted to be on the medial aspect. The K wire was removed and the osteotomy was closed and compressed. This was stabilized with a small compression staple. Good alignment and compression was noted of the great toe and osteotomy site. The  osteotomy was noted be very stable. Multiple views of fluoroscopy noted good realignment of the first MTPJ. Layered closure was then performed with a 3-0 Vicryl for the deeper layers, 4-0 Vicryl for the subcutaneous tissue and a 5-0 Monocryl undyed. 0.25% Marcaine was then placed around all areas.    Patient tolerated the procedure and anesthesia well.  Was transported from the OR to the PACU with all vital signs stable and vascular status intact. To be discharged per routine protocol.  Will follow up in approximately 1 week in the outpatient clinic.

## 2017-01-15 NOTE — Anesthesia Procedure Notes (Signed)
Procedure Name: MAC Performed by: Tomothy Eddins Pre-anesthesia Checklist: Patient identified, Emergency Drugs available, Suction available, Timeout performed and Patient being monitored Patient Re-evaluated:Patient Re-evaluated prior to inductionOxygen Delivery Method: Simple face mask Placement Confirmation: positive ETCO2       

## 2017-01-16 ENCOUNTER — Encounter: Payer: Self-pay | Admitting: Podiatry

## 2017-03-04 ENCOUNTER — Other Ambulatory Visit: Payer: Self-pay | Admitting: Family Medicine

## 2017-03-04 DIAGNOSIS — Z1239 Encounter for other screening for malignant neoplasm of breast: Secondary | ICD-10-CM

## 2017-04-28 ENCOUNTER — Ambulatory Visit
Admission: RE | Admit: 2017-04-28 | Discharge: 2017-04-28 | Disposition: A | Discharge status: Home / Self Care | Payer: Medicare Other | Source: Ambulatory Visit | Attending: Family Medicine | Admitting: Family Medicine

## 2017-04-28 DIAGNOSIS — Z1239 Encounter for other screening for malignant neoplasm of breast: Secondary | ICD-10-CM

## 2017-04-28 DIAGNOSIS — Z1231 Encounter for screening mammogram for malignant neoplasm of breast: Secondary | ICD-10-CM | POA: Insufficient documentation

## 2018-03-20 ENCOUNTER — Other Ambulatory Visit: Payer: Self-pay | Admitting: Family Medicine

## 2018-03-20 DIAGNOSIS — Z1231 Encounter for screening mammogram for malignant neoplasm of breast: Secondary | ICD-10-CM

## 2018-05-04 ENCOUNTER — Ambulatory Visit
Admission: RE | Admit: 2018-05-04 | Discharge: 2018-05-04 | Disposition: A | Discharge status: Home / Self Care | Payer: Medicare Other | Source: Ambulatory Visit | Attending: Family Medicine | Admitting: Family Medicine

## 2018-05-04 DIAGNOSIS — Z1231 Encounter for screening mammogram for malignant neoplasm of breast: Secondary | ICD-10-CM | POA: Diagnosis not present

## 2018-06-10 ENCOUNTER — Ambulatory Visit
Admission: RE | Admit: 2018-06-10 | Discharge: 2018-06-10 | Disposition: A | Discharge status: Home / Self Care | Payer: Medicare Other | Source: Ambulatory Visit | Attending: Internal Medicine | Admitting: Internal Medicine

## 2018-06-10 ENCOUNTER — Encounter
Admission: RE | Disposition: A | Discharge status: Home / Self Care | Payer: Self-pay | Source: Ambulatory Visit | Attending: Internal Medicine

## 2018-06-10 ENCOUNTER — Other Ambulatory Visit: Payer: Self-pay

## 2018-06-10 ENCOUNTER — Ambulatory Visit: Payer: Medicare Other | Admitting: Anesthesiology

## 2018-06-10 ENCOUNTER — Encounter: Payer: Self-pay | Admitting: *Deleted

## 2018-06-10 DIAGNOSIS — K319 Disease of stomach and duodenum, unspecified: Secondary | ICD-10-CM | POA: Insufficient documentation

## 2018-06-10 DIAGNOSIS — D509 Iron deficiency anemia, unspecified: Secondary | ICD-10-CM | POA: Insufficient documentation

## 2018-06-10 DIAGNOSIS — C182 Malignant neoplasm of ascending colon: Secondary | ICD-10-CM | POA: Diagnosis not present

## 2018-06-10 DIAGNOSIS — K640 First degree hemorrhoids: Secondary | ICD-10-CM | POA: Diagnosis not present

## 2018-06-10 DIAGNOSIS — Z79899 Other long term (current) drug therapy: Secondary | ICD-10-CM | POA: Insufficient documentation

## 2018-06-10 DIAGNOSIS — F209 Schizophrenia, unspecified: Secondary | ICD-10-CM | POA: Diagnosis not present

## 2018-06-10 DIAGNOSIS — J449 Chronic obstructive pulmonary disease, unspecified: Secondary | ICD-10-CM | POA: Diagnosis not present

## 2018-06-10 DIAGNOSIS — K2970 Gastritis, unspecified, without bleeding: Secondary | ICD-10-CM | POA: Insufficient documentation

## 2018-06-10 DIAGNOSIS — Z87891 Personal history of nicotine dependence: Secondary | ICD-10-CM | POA: Insufficient documentation

## 2018-06-10 HISTORY — PX: ESOPHAGOGASTRODUODENOSCOPY: SHX5428

## 2018-06-10 HISTORY — DX: Slow transit constipation: K59.01

## 2018-06-10 HISTORY — PX: COLONOSCOPY WITH PROPOFOL: SHX5780

## 2018-06-10 HISTORY — DX: Deficiency of other specified B group vitamins: E53.8

## 2018-06-10 HISTORY — DX: Vitamin D deficiency, unspecified: E55.9

## 2018-06-10 HISTORY — DX: Diffuse cystic mastopathy of unspecified breast: N60.19

## 2018-06-10 HISTORY — DX: Chronic obstructive pulmonary disease, unspecified: J44.9

## 2018-06-10 SURGERY — EGD (ESOPHAGOGASTRODUODENOSCOPY)
Anesthesia: General

## 2018-06-10 MED ORDER — PROPOFOL 500 MG/50ML IV EMUL
INTRAVENOUS | Status: AC
Start: 2018-06-10 — End: ?
  Filled 2018-06-10: qty 50

## 2018-06-10 MED ORDER — PROPOFOL 10 MG/ML IV BOLUS
INTRAVENOUS | Status: DC | PRN
  Administered 2018-06-10: 30 mg via INTRAVENOUS
  Administered 2018-06-10: 50 mg via INTRAVENOUS

## 2018-06-10 MED ORDER — LIDOCAINE HCL (CARDIAC) PF 100 MG/5ML IV SOSY
PREFILLED_SYRINGE | INTRAVENOUS | Status: DC | PRN
  Administered 2018-06-10: 80 mg via INTRAVENOUS

## 2018-06-10 MED ORDER — SPOT INK MARKER SYRINGE KIT
PACK | SUBMUCOSAL | Status: DC | PRN
  Administered 2018-06-10: 9 mL via SUBMUCOSAL

## 2018-06-10 MED ORDER — SODIUM CHLORIDE 0.9 % IV SOLN
INTRAVENOUS | Status: DC
  Administered 2018-06-10: 07:00:00 via INTRAVENOUS

## 2018-06-10 MED ORDER — PHENYLEPHRINE HCL 10 MG/ML IJ SOLN
INTRAMUSCULAR | Status: AC
  Filled 2018-06-10: qty 1

## 2018-06-10 MED ORDER — PHENYLEPHRINE HCL 10 MG/ML IJ SOLN
INTRAMUSCULAR | Status: DC | PRN
  Administered 2018-06-10: 100 ug via INTRAVENOUS

## 2018-06-10 MED ORDER — PROPOFOL 500 MG/50ML IV EMUL
INTRAVENOUS | Status: DC | PRN
  Administered 2018-06-10: 60 ug/kg/min via INTRAVENOUS

## 2018-06-10 MED ORDER — LIDOCAINE HCL (PF) 2 % IJ SOLN
INTRAMUSCULAR | Status: AC
  Filled 2018-06-10: qty 10

## 2018-06-10 NOTE — Op Note (Signed)
[facility] Gastroenterology Patient Name: Kathy [name] Procedure Date: 06/10/2018 7:36 AM MRN: [id number] Account #: [id number] Date of Birth: June 12, 1946 Admit Type: Outpatient Age: 72 Room: [facility] ENDO ROOM 4 Gender: Female Note Status: Finalized Procedure:            Colonoscopy Indications:          Unexplained iron deficiency anemia Providers:            [name]. Karnisha Lefebre MD, MD Medicines:            Propofol per Anesthesia Complications:        No immediate complications. Procedure:            Pre-Anesthesia Assessment:                       - The risks and benefits of the procedure and the                        sedation options and risks were discussed with the                        patient. All questions were answered and informed                        consent was obtained.                       - Patient identification and proposed procedure were                        verified prior to the procedure by the nurse. The                        procedure was verified in the procedure room.                       - ASA Grade Assessment: III - A patient with severe                        systemic disease.                       - After reviewing the risks and benefits, the patient                        was deemed in satisfactory condition to undergo the                        procedure.                       After obtaining informed consent, the colonoscope was                        passed under direct vision. Throughout the procedure,                        the patient's blood pressure, pulse, and oxygen                        saturations were monitored continuously. The  Colonoscope was introduced through the anus and                        advanced to the the cecum, identified by appendiceal                        orifice and ileocecal valve. The colonoscopy was                        technically difficult and complex due to a  partially                        obstructing mass and restricted mobility of the colon.                        Successful completion of the procedure was aided by                        using manual pressure and applying abdominal pressure.                        The patient tolerated the procedure well. The quality                        of the bowel preparation was good. The ileocecal valve,                        appendiceal orifice, and rectum were photographed. Findings:      The perianal and digital rectal examinations were normal. Pertinent       negatives include normal sphincter tone and no palpable rectal lesions.      A frond-like/villous partially obstructing medium-sized mass was found       in the distal ascending colon. The mass was circumferential. The mass       measured three cm in length. In addition, its diameter measured fifteen       mm. No bleeding was present. This was biopsied with a cold       large-capacity forceps for histology. Area was tattooed with an       injection of [place] ink 4cc proximal and distal to the lesion (9cc       total). Estimated blood loss was minimal.      Non-bleeding internal hemorrhoids were found during retroflexion. The       hemorrhoids were Grade I (internal hemorrhoids that do not prolapse).      The exam was otherwise without abnormality. 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 Biopsied. Tattooed.                       - Non-bleeding internal hemorrhoids.                       - The examination was otherwise normal. Recommendation:       - Patient has a contact number available for  emergencies. The signs and symptoms of potential                        delayed complications were discussed with the patient.                        Return to normal activities tomorrow. Written discharge                        instructions were  provided to the patient.                       - Resume previous diet.                       - Continue present medications.                       - Await pathology results.                       - Refer to a surgeon in 1 week.                       - The findings and recommendations were discussed with                        the patient and their spouse. Procedure Code(s):    --- Professional ---                       [phone], Colonoscopy, flexible; with directed submucosal                        injection(s), any substance                       45809, Colonoscopy, flexible; with biopsy, single or                        multiple Diagnosis Code(s):    --- Professional ---                       D49.0, Neoplasm of unspecified behavior of digestive                        system                       K56.690, Other partial intestinal obstruction                       K64.0, First degree hemorrhoids                       D50.9, Iron deficiency anemia, unspecified CPT copyright 2017 American Medical Association. All rights reserved. The codes documented in this report are preliminary and upon coder review may  be revised to meet current compliance requirements. Efrain Sella MD, MD 06/10/2018 8:49:18 AM This report has been signed electronically. Number of Addenda: 0 Note Initiated On: 06/10/2018 7:36 AM Scope Withdrawal Time: 0 hours 13 minutes 33 seconds  Total Procedure Duration: 0 hours 23 minutes 32 seconds       [place]  [facility]

## 2018-06-10 NOTE — Anesthesia Post-op Follow-up Note (Signed)
Anesthesia QCDR form completed.        

## 2018-06-10 NOTE — Op Note (Signed)
[facility] Gastroenterology Patient Name: Jenese Mischke Procedure Date: 06/10/2018 7:36 AM MRN: [id number] Account #: [id number] Date of Birth: 1946-01-06 Admit Type: Outpatient Age: 72 Room: [facility] ENDO ROOM 4 Gender: Female Note Status: Finalized Procedure:            Upper GI endoscopy Indications:          Suspected upper gastrointestinal bleeding in patient                        with unexplained iron deficiency anemia Providers:            [name]. Alice Reichert MD, MD Referring MD:         Bo Mcclintock. Vickki Muff (Referring MD) Medicines:            Propofol per Anesthesia Complications:        No immediate complications. Procedure:            Pre-Anesthesia Assessment:                       - The risks and benefits of the procedure and the                        sedation options and risks were discussed with the                        patient. All questions were answered and informed                        consent was obtained.                       - Patient identification and proposed procedure were                        verified prior to the procedure by the nurse. The                        procedure was verified in the procedure room.                       - ASA Grade Assessment: III - A patient with severe                        systemic disease.                       - After reviewing the risks and benefits, the patient                        was deemed in satisfactory condition to undergo the                        procedure.                       After obtaining informed consent, the endoscope was                        passed under direct vision. Throughout the procedure,  the patient's blood pressure, pulse, and oxygen                        saturations were monitored continuously. The Endoscope                        was introduced through the mouth, and advanced to the                        third part of duodenum. The upper GI  endoscopy was                        accomplished without difficulty. The patient tolerated                        the procedure well. Findings:      Moderate tortuosity of the mid to distal esophagus was noted compatible       with a diagnosis of Presbyesophagus.      Segmental moderate inflammation characterized by congestion (edema) and       erythema was found in the gastric antrum. Biopsies were taken with a       cold forceps for Helicobacter pylori testing.      The cardia and gastric fundus were normal on retroflexion.      The examined duodenum was normal. Biopsies were taken with a cold       forceps for histology. Biopsies for histology were taken with a cold       forceps for evaluation of celiac disease.      The exam was otherwise without abnormality. Impression:           - Gastritis. Biopsied.                       - Normal examined duodenum. Biopsied.                       - The examination was otherwise normal. Recommendation:       - Await pathology results.                       - Proceed with colonoscopy Procedure Code(s):    --- Professional ---                       [phone], Esophagogastroduodenoscopy, flexible, transoral;                        with biopsy, single or multiple Diagnosis Code(s):    --- Professional ---                       D50.9, Iron deficiency anemia, unspecified                       K29.70, Gastritis, unspecified, without bleeding CPT copyright 2017 American Medical Association. All rights reserved. The codes documented in this report are preliminary and upon coder review may  be revised to meet current compliance requirements. Efrain Sella MD, MD 06/10/2018 8:18:29 AM This report has been signed electronically. Number of Addenda: 0 Note Initiated On: 06/10/2018 7:36 AM      [facility]

## 2018-06-10 NOTE — H&P (Signed)
Outpatient short stay form Pre-procedure 06/10/2018 8:00 AM Kathy [name], M.D.  Primary Physician: Kathy [name]  Reason for visit: Iron deficiency anemia  History of present illness: Kathy [name] 72 year old female with a history of chronic constipation presents for iron deficiency anemia.  Patient also has a personal history of colon polyps.  Patient denies any abdominal pain, rectal bleeding or weight loss. The patient denies any symptoms of significant heartburn, dysphagia, nausea or vomiting.    Current Facility-Administered Medications:  .  0.9 %  sodium chloride infusion, , Intravenous, Continuous, [place], Kathy Pike, MD, Last Rate: 20 mL/hr at 06/10/18 5053  Medications Prior to Admission  Medication Sig Dispense Refill Last Dose  . ASPIRIN 81 PO Take by mouth daily.   Past Week at Unknown time  . Cholecalciferol (VITAMIN D3) 2000 units TABS Take by mouth daily.   Past Week at Unknown time  . conjugated estrogens (PREMARIN) vaginal cream Place 1 Applicatorful vaginally 3 (three) times a week.   06/09/2018 at Unknown time  . Cyanocobalamin (VITAMIN B-12 PO) Take by mouth daily.   Past Week at Unknown time  . Ferrous Fumarate (HEMOCYTE - 106 MG FE) 324 (106 Fe) MG TABS tablet Take 1 tablet by mouth 2 (two) times daily.   Past Week at Unknown time  . PROCYCLIDINE HCL PO Take 5 mg by mouth daily. (Kemadrine)   06/09/2018 at 1200  . Sennosides (EX-LAX PO) Take by mouth as needed.   Past Week at Unknown time  . sennosides-docusate sodium (SENOKOT-S) 8.6-50 MG tablet Take 1 tablet by mouth daily as needed for constipation.   Past Week at Unknown time  . thiothixene (NAVANE) 5 MG capsule Take 5 mg by mouth 3 (three) times daily.    06/09/2018 at 1200  . HYDROcodone-acetaminophen (NORCO) 5-325 MG tablet Take 1 tablet by mouth every 6 (six) hours as needed for moderate pain. (Patient not taking: Reported on 06/10/2018) 30 tablet 0 Not Taking at Unknown time  . ibandronate (BONIVA) 150 MG tablet  Take 150 mg by mouth every 30 (thirty) days. Take in the morning with a full glass of water, on an empty stomach, and do not take anything else by mouth or lie down for the next 30 min.   Not Taking at Unknown time  . oxyCODONE-acetaminophen (ROXICET) 5-325 MG tablet Take 1-2 tablets by mouth every 4 (four) hours as needed. (Patient not taking: Reported on 06/10/2018) 30 tablet 0 Not Taking at Unknown time     Allergies  Allergen Reactions  . Biaxin [Clarithromycin] Shortness Of Breath  . Levofloxacin     Muscle pain  . Penicillins Rash     Past Medical History:  Diagnosis Date  . Arthritis    hips  . B12 deficiency   . 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 COPD (chronic obstructive pulmonary disease) ([place])   . Fibrocystic breast disease   . Mediastinal mass    removed 1/16  . Motion sickness    fair rides  . Osteoporosis   . Schizophrenia ([place])   . Vitamin D deficiency   . Wears dentures    partial upper and lower    Review of systems:  Otherwise negative.    Physical Exam  Gen: Alert, oriented. Appears stated age.  HEENT: [place]/AT. PERRLA. Lungs: CTA, no wheezes. CV: RR nl S1, S2. Abd: soft, benign, no masses. BS+ Ext: No edema. Pulses 2+    Planned procedures: Proceed with EGD and colonoscopy. The patient understands the  nature of the planned procedure, indications, risks, alternatives and potential complications including but not limited to bleeding, infection, perforation, damage to internal organs and possible oversedation/side effects from anesthesia. The patient agrees and gives consent to proceed.  Please refer to procedure notes for findings, recommendations and patient disposition/instructions.     [name] Kathy [name], M.D. Gastroenterology 06/10/2018  8:00 AM

## 2018-06-10 NOTE — Transfer of Care (Signed)
Immediate Anesthesia Transfer of Care Note  Patient: Kathy [name]  Procedure(s) Performed: ESOPHAGOGASTRODUODENOSCOPY (EGD) (N/A ) COLONOSCOPY WITH PROPOFOL (N/A )  Patient Location: PACU  Anesthesia Type:General  Level of Consciousness: sedated and responds to stimulation  Airway & Oxygen Therapy: Patient Spontanous Breathing and Patient connected to nasal cannula oxygen  Post-op Assessment: Report given to RN and Post -op Vital signs reviewed and stable  Post vital signs: Reviewed and stable  Last Vitals:  Vitals Value Taken Time  BP 118/63 06/10/2018  8:47 AM  Temp 36.3 C 06/10/2018  8:46 AM  Pulse 68 06/10/2018  8:47 AM  Resp 16 06/10/2018  8:47 AM  SpO2 100 % 06/10/2018  8:47 AM    Last Pain:  Vitals:   06/10/18 0846  TempSrc: Tympanic  PainSc: Asleep         Complications: No apparent anesthesia complications

## 2018-06-10 NOTE — Anesthesia Postprocedure Evaluation (Signed)
Anesthesia Post Note  Patient: Kathy [name]  Procedure(s) Performed: ESOPHAGOGASTRODUODENOSCOPY (EGD) (N/A ) COLONOSCOPY WITH PROPOFOL (N/A )  Patient location during evaluation: PACU Anesthesia Type: General Level of consciousness: awake and alert Pain management: pain level controlled Vital Signs Assessment: post-procedure vital signs reviewed and stable Respiratory status: spontaneous breathing, nonlabored ventilation and respiratory function stable Cardiovascular status: blood pressure returned to baseline and stable Postop Assessment: no apparent nausea or vomiting Anesthetic complications: no     Last Vitals:  Vitals:   06/10/18 0856 06/10/18 0906  BP: 107/74 129/68  Pulse:    Resp:    Temp:    SpO2:      Last Pain:  Vitals:   06/10/18 0906  TempSrc:   PainSc: 0-No pain                 [name]

## 2018-06-10 NOTE — Anesthesia Preprocedure Evaluation (Addendum)
Anesthesia Evaluation  Patient identified by MRN, date of birth, ID band Patient awake    Reviewed: Allergy & Precautions, H&P , NPO status , Patient's Chart, lab work & pertinent test results  Airway Mallampati: II  TM Distance: >3 FB Neck ROM: full    Dental   Pulmonary neg pulmonary ROS, COPD (no recent exacerbations),  COPD inhaler, former smoker,           Cardiovascular (-) Past MI, (-) Cardiac Stents and (-) CHF negative cardio ROS       Neuro/Psych PSYCHIATRIC DISORDERS Schizophrenia negative neurological ROS  negative psych ROS   GI/Hepatic negative GI ROS, Neg liver ROS,   Endo/Other  negative endocrine ROS  Renal/GU negative Renal ROS  negative genitourinary   Musculoskeletal  (+) Arthritis ,   Abdominal   Peds  Hematology negative hematology ROS (+)   Anesthesia Other Findings Past Medical History: No date: Arthritis     Comment:  hips No date: B12 deficiency No date: Constipation due to slow transit No date: COPD (chronic obstructive pulmonary disease) ([facility]) No date: Fibrocystic breast disease No date: Mediastinal mass     Comment:  removed 1/16 No date: Motion sickness     Comment:  fair rides No date: Osteoporosis No date: Schizophrenia ([place]) No date: Vitamin D deficiency No date: Wears dentures     Comment:  partial upper and lower  Past Surgical History: No date: BREAST BIOPSY; Left     Comment:  neg 05/04/13: BREAST BIOPSY; Right     Comment:  [place] bx/clip-neg No date: COLONOSCOPY 11/20/2016: HALLUX VALGUS AKIN; Right     Comment:  Procedure: Esto;  Surgeon: Samara Deist,               DPM;  Location: [place];  Service: Podiatry;               Laterality: Right; 11/20/2016: [place]; Right     Comment:  Procedure: HALLUX VALGUS [place]  Alroy Dust) right;                Surgeon: Samara Deist, DPM;  Location: [place];   Service: Podiatry;  Laterality: Right;  IVA with               Popliteal 01/15/2017: HALLUX VALGUS [place]; Left     Comment:  Procedure: HALLUX VALGUS [place]IN  Correction left foot                Iva Popiteal;  Surgeon: Samara Deist, DPM;  Location:               [place];  Service: Podiatry;  Laterality:               Left;  IVA Popliteal  11/09/2014: MEDIASTINAL MASS EXCISION; Left     Comment:  Dr. Wynelle Cleveland, [place] 11/09/2014: THORACOSCOPY; Left     Comment:  with excision mediastinal mass No date: TUBAL LIGATION     Reproductive/Obstetrics negative OB ROS                           Anesthesia Physical Anesthesia Plan  ASA: II  Anesthesia Plan: General   Post-op Pain Management:    Induction: Intravenous  PONV Risk Score and Plan: Propofol infusion  Airway Management Planned: Natural Airway  Additional Equipment:   Intra-op  Plan:   Post-operative Plan:   Informed Consent: I have reviewed the patients History and Physical, chart, labs and discussed the procedure including the risks, benefits and alternatives for the proposed anesthesia with the patient or authorized representative who has indicated his/her understanding and acceptance.   Dental Advisory Given  Plan Discussed with: Anesthesiologist, CRNA and Surgeon  Anesthesia Plan Comments:        Anesthesia Quick Evaluation

## 2018-06-11 ENCOUNTER — Encounter: Payer: Self-pay | Admitting: Internal Medicine

## 2018-06-11 LAB — SURGICAL PATHOLOGY

## 2018-06-15 ENCOUNTER — Other Ambulatory Visit: Payer: Self-pay | Admitting: Surgery

## 2018-06-15 DIAGNOSIS — C182 Malignant neoplasm of ascending colon: Secondary | ICD-10-CM

## 2018-06-16 ENCOUNTER — Ambulatory Visit: Payer: Self-pay | Admitting: Surgery

## 2018-06-16 ENCOUNTER — Inpatient Hospital Stay: Admission: RE | Admit: 2018-06-16 | Payer: Medicare Other | Source: Ambulatory Visit

## 2018-06-16 MED ORDER — DEXTROSE 5 % IV SOLN
900.0000 mg | INTRAVENOUS | Status: AC
  Administered 2018-07-06: 900 mg via INTRAVENOUS

## 2018-06-16 MED ORDER — GENTAMICIN SULFATE 40 MG/ML IJ SOLN
5.0000 mg/kg | INTRAVENOUS | Status: AC
  Administered 2018-07-06: 350 mg via INTRAVENOUS

## 2018-06-16 NOTE — H&P (View-Only) (Signed)
Subjective:   CC: ascending colon CA  HPI:  Kathy [name] is a 72 y.o. female who was referred by Kathline Magic, MD for evaluation of above.  Initially referred to GI for iron deficiency anemia that was persistent despite taking iron supplements.  Denies any blood in her stool and denies any black stools prior to starting iron supplementation.  She does have a history of constipation she still endorses.  Endoscopy by GI did show a a sending colon mass near the distal aspect according to endoscopy reports with biopsy proven adenocarcinoma.  Patient is and son is here today to discuss surgical management.  Today she denies any joint pain long bone pain weight loss headaches changes in vision or hearing.  She also denies any respiratory symptoms including cough, hemoptysis, wheezing or increased secretions.   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  Past Surgical History:       Past Surgical History:  Procedure Laterality Date  . BREAST EXCISIONAL BIOPSY  2001  . COLONOSCOPY    . COLONOSCOPY    . THORACOSCOPY WITH EXCISION MEDIASTINAL MASS Left 11/09/2014   Procedure: THORACOSCOPY W/EXCISION MEDIASTINAL MASS;  Surgeon: Darius Bump., MD;  Location: [facility];  Service: Cardiothoracic;  Laterality: Left;  left  . TUBAL LIGATION Bilateral 1980    Family History: family history includes Alcohol abuse in her father; Constipation in her mother; Diabetes type II in her mother; Lymphoma in her  father.  Social History:  reports that she quit smoking about 3 years ago. Her smoking use included cigarettes. She has a 47.00 pack-year smoking history. She has never used smokeless tobacco. She reports that she does not drink alcohol or use drugs.  Current Medications: has a current medication list which includes the following prescription(s): acetaminophen, ascorbic acid (vitamin c), conjugated estrogens, ferrous gluconate, ferrous sulfate, peg-electrolyte, procyclidine hcl, sennosides-docusate, shingrix (pf), thiothixene, metronidazole, and neomycin, and the following Facility-Administered Medications: cyanocobalamin.  Allergies:       Allergies as of 06/15/2018 - Reviewed 06/15/2018  Allergen Reaction Noted  . Biaxin [clarithromycin] Shortness Of Breath 10/04/2014  . Levofloxacin Muscle Pain 01/12/2014  . Penicillins Rash     ROS:  A 15 point review of systems was performed and pertinent positives and negatives noted in HPI   Objective:   BP 114/68   Pulse 83   Temp 36.8 C (98.3 F) (Oral)   Ht 162.6 cm (5' 4.02")   Wt 73.5 kg (162 lb)   LMP  (LMP Unknown)   BMI 27.79 kg/m   Constitutional :  alert, appears stated age, cooperative and no distress  Lymphatics/Throat:  no asymmetry, masses, or scars, supple without significant adenopathy  Respiratory:  clear to auscultation bilaterally  Cardiovascular:  regular rate and rhythm  Gastrointestinal: soft, non-tender; bowel sounds normal; no masses,  no organomegaly.   Musculoskeletal: Steady gait and movement  Skin: Cool and moist  Psychiatric: Normal affect, non-agitated, not confused       LABS:  n/a  RADS: Procedure: F-18 FDG PET/CT scan from the skull base to the mid thighs.  Indication: Female, 72 years old. R22.2 Localized swelling, mass and lump, trunk, preop  Radiopharmaceutical: 11.65 mCi of F-18 FDG, intravenously. Blood Glucose level prior to FDG injection: 86 mg/dL. Time from injection to  imaging: 60 minutes.  Technique: PET/CT imaging was performed from the skull base to the mid thighs using routine PET acquisition following evaluation of serum glucose level and intravenous administration of F-18 FDG, per standard protocol. A CT scan was performed for localization and attenuation correction purposes only and is not intended for diagnosis separate from the PET scan.  Complications: None.  Prior Imaging studies: None available  Findings:  Head/Neck: Physiologic FDG activity is identified in the pharyngeal musculature, tonsils, and salivary glands. Small cervical lymph nodes are noted without abnormal FDG accumulation. No abnormal FDG activity within the neck. No metabolically active cervical masses.  Chest: There is a non-metabolically active, low-attenuation structure in the anterior mediastinum conforming to the shape of the heart and great vessels. There are two 3-mm pulmonary nodules in the right upper lobe (image 78 and 79). No metabolically active mediastinal, hilar, or axillary lymphadenopathy. No abnormal FDG activity within the chest.  Abdomen / Pelvis: Physiologic FDG activity is identified in the genitourinary tract and bowel. Cholelithiasis is noted. No abnormal abdominal or pelvic FDG activity. No metabolically active abdominal or pelvic lymphadenopathy. No intraperitoneal masses.  Osseous: Age-indeterminate compression deformity of the L2 vertebral body. There is sclerosis of the anterior aspect of the T11 and T12 vertebral bodies without increased radiotracer uptake, likely related to degenerative changes. No aggressive lesions. No abnormal osseous FDG activity.  Impression:  1. 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  2. Right upper lobe pulmonary nodules are likely related to prior infection. In the absence of prior exams, consider follow up 12 month low-dose chest CT  to establish stability.  Electronically Reviewed by: Amada Kingfisher, MD Electronically Reviewed on: 09/29/2014 4:21 PM  I have reviewed the images and concur with the above findings. Assessment:       Malignant neoplasm of ascending colon (CMS-HCC) [C18.2]  COPD Schizophrenia Vit B12 def.  Plan:   1. Malignant neoplasm of ascending colon (CMS-HCC) [C18.2]   Will obtain CBC, CMP, CT chest/abd/pelvis (PET/CT done in 2015 as note above with pulm nodules, not followed up with additional imaging), and prep for surgery.  Discussed pathophisiology of colon CA in depth.  The risk of laparoscopic colon resection surgery includes, but not limited to, recurrence, bleeding, chronic pain, post-op infxn, post-op SBO or ileus, hernias, resection of bowel, re-anastamosis, possible ostomy placement and need for re-operation to address said risks. The risks of general anesthetic, if used, includes MI, CVA, sudden death or even reaction to anesthetic medications also discussed. Alternatives include continued observation.  Benefits include possible symptom relief, preventing further decline in health and possible death.  Typical post-op recovery time of additional days in hospital for observation afterwards also discussed.  Prep ordered.  Will proceed with ERAS protocol as well.  Pending medical clearance and workup as noted above.    The patient and son verbalized understanding and all questions were answered to the patient's satisfaction.  COPD- stable, not taking any meds.  No need for further treatment Schizophrenia- stable, continue taking meds.  Vit B12 def. Continue meds

## 2018-06-16 NOTE — H&P (Signed)
Subjective:   CC: ascending colon CA  HPI:  Kathy [name] is a 72 y.o. female who was referred by Kathline Magic, MD for evaluation of above.  Initially referred to GI for iron deficiency anemia that was persistent despite taking iron supplements.  Denies any blood in her stool and denies any black stools prior to starting iron supplementation.  She does have a history of constipation she still endorses.  Endoscopy by GI did show a a sending colon mass near the distal aspect according to endoscopy reports with biopsy proven adenocarcinoma.  Patient is and son is here today to discuss surgical management.  Today she denies any joint pain long bone pain weight loss headaches changes in vision or hearing.  She also denies any respiratory symptoms including cough, hemoptysis, wheezing or increased secretions.   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  Past Surgical History:       Past Surgical History:  Procedure Laterality Date  . BREAST EXCISIONAL BIOPSY  2001  . COLONOSCOPY    . COLONOSCOPY    . THORACOSCOPY WITH EXCISION MEDIASTINAL MASS Left 11/09/2014   Procedure: THORACOSCOPY W/EXCISION MEDIASTINAL MASS;  Surgeon: Darius Bump., MD;  Location: [facility];  Service: Cardiothoracic;  Laterality: Left;  left  . TUBAL LIGATION Bilateral 1980    Family History: family history includes Alcohol abuse in her father; Constipation in her mother; Diabetes type II in her mother; Lymphoma in her  father.  Social History:  reports that she quit smoking about 3 years ago. Her smoking use included cigarettes. She has a 47.00 pack-year smoking history. She has never used smokeless tobacco. She reports that she does not drink alcohol or use drugs.  Current Medications: has a current medication list which includes the following prescription(s): acetaminophen, ascorbic acid (vitamin c), conjugated estrogens, ferrous gluconate, ferrous sulfate, peg-electrolyte, procyclidine hcl, sennosides-docusate, shingrix (pf), thiothixene, metronidazole, and neomycin, and the following Facility-Administered Medications: cyanocobalamin.  Allergies:       Allergies as of 06/15/2018 - Reviewed 06/15/2018  Allergen Reaction Noted  . Biaxin [clarithromycin] Shortness Of Breath 10/04/2014  . Levofloxacin Muscle Pain 01/12/2014  . Penicillins Rash     ROS:  A 15 point review of systems was performed and pertinent positives and negatives noted in HPI   Objective:   BP 114/68   Pulse 83   Temp 36.8 C (98.3 F) (Oral)   Ht 162.6 cm (5' 4.02")   Wt 73.5 kg (162 lb)   LMP  (LMP Unknown)   BMI 27.79 kg/m   Constitutional :  alert, appears stated age, cooperative and no distress  Lymphatics/Throat:  no asymmetry, masses, or scars, supple without significant adenopathy  Respiratory:  clear to auscultation bilaterally  Cardiovascular:  regular rate and rhythm  Gastrointestinal: soft, non-tender; bowel sounds normal; no masses,  no organomegaly.   Musculoskeletal: Steady gait and movement  Skin: Cool and moist  Psychiatric: Normal affect, non-agitated, not confused       LABS:  n/a  RADS: Procedure: F-18 FDG PET/CT scan from the skull base to the mid thighs.  Indication: Female, 72 years old. R22.2 Localized swelling, mass and lump, trunk, preop  Radiopharmaceutical: 11.65 mCi of F-18 FDG, intravenously. Blood Glucose level prior to FDG injection: 86 mg/dL. Time from injection to  imaging: 60 minutes.  Technique: PET/CT imaging was performed from the skull base to the mid thighs using routine PET acquisition following evaluation of serum glucose level and intravenous administration of F-18 FDG, per standard protocol. A CT scan was performed for localization and attenuation correction purposes only and is not intended for diagnosis separate from the PET scan.  Complications: None.  Prior Imaging studies: None available  Findings:  Head/Neck: Physiologic FDG activity is identified in the pharyngeal musculature, tonsils, and salivary glands. Small cervical lymph nodes are noted without abnormal FDG accumulation. No abnormal FDG activity within the neck. No metabolically active cervical masses.  Chest: There is a non-metabolically active, low-attenuation structure in the anterior mediastinum conforming to the shape of the heart and great vessels. There are two 3-mm pulmonary nodules in the right upper lobe (image 78 and 79). No metabolically active mediastinal, hilar, or axillary lymphadenopathy. No abnormal FDG activity within the chest.  Abdomen / Pelvis: Physiologic FDG activity is identified in the genitourinary tract and bowel. Cholelithiasis is noted. No abnormal abdominal or pelvic FDG activity. No metabolically active abdominal or pelvic lymphadenopathy. No intraperitoneal masses.  Osseous: Age-indeterminate compression deformity of the L2 vertebral body. There is sclerosis of the anterior aspect of the T11 and T12 vertebral bodies without increased radiotracer uptake, likely related to degenerative changes. No aggressive lesions. No abnormal osseous FDG activity.  Impression:  1. 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  2. Right upper lobe pulmonary nodules are likely related to prior infection. In the absence of prior exams, consider follow up 12 month low-dose chest CT  to establish stability.  Electronically Reviewed by: Amada Kingfisher, MD Electronically Reviewed on: 09/29/2014 4:21 PM  I have reviewed the images and concur with the above findings. Assessment:       Malignant neoplasm of ascending colon (CMS-HCC) [C18.2]  COPD Schizophrenia Vit B12 def.  Plan:   1. Malignant neoplasm of ascending colon (CMS-HCC) [C18.2]   Will obtain CBC, CMP, CT chest/abd/pelvis (PET/CT done in 2015 as note above with pulm nodules, not followed up with additional imaging), and prep for surgery.  Discussed pathophisiology of colon CA in depth.  The risk of laparoscopic colon resection surgery includes, but not limited to, recurrence, bleeding, chronic pain, post-op infxn, post-op SBO or ileus, hernias, resection of bowel, re-anastamosis, possible ostomy placement and need for re-operation to address said risks. The risks of general anesthetic, if used, includes MI, CVA, sudden death or even reaction to anesthetic medications also discussed. Alternatives include continued observation.  Benefits include possible symptom relief, preventing further decline in health and possible death.  Typical post-op recovery time of additional days in hospital for observation afterwards also discussed.  Prep ordered.  Will proceed with ERAS protocol as well.  Pending medical clearance and workup as noted above.    The patient and son verbalized understanding and all questions were answered to the patient's satisfaction.  COPD- stable, not taking any meds.  No need for further treatment Schizophrenia- stable, continue taking meds.  Vit B12 def. Continue meds

## 2018-06-17 ENCOUNTER — Ambulatory Visit
Admission: RE | Admit: 2018-06-17 | Discharge: 2018-06-17 | Disposition: A | Discharge status: Home / Self Care | Payer: Medicare Other | Source: Ambulatory Visit | Attending: Surgery | Admitting: Surgery

## 2018-06-17 DIAGNOSIS — C182 Malignant neoplasm of ascending colon: Secondary | ICD-10-CM | POA: Diagnosis present

## 2018-06-17 DIAGNOSIS — R918 Other nonspecific abnormal finding of lung field: Secondary | ICD-10-CM | POA: Diagnosis not present

## 2018-06-17 DIAGNOSIS — K8020 Calculus of gallbladder without cholecystitis without obstruction: Secondary | ICD-10-CM | POA: Insufficient documentation

## 2018-06-17 DIAGNOSIS — K769 Liver disease, unspecified: Secondary | ICD-10-CM | POA: Diagnosis not present

## 2018-06-17 MED ORDER — IOHEXOL 300 MG/ML  SOLN
100.0000 mL | Freq: Once | INTRAMUSCULAR | Status: AC | PRN
  Administered 2018-06-17: 100 mL via INTRAVENOUS

## 2018-06-18 ENCOUNTER — Other Ambulatory Visit: Payer: Self-pay | Admitting: Surgery

## 2018-06-18 ENCOUNTER — Inpatient Hospital Stay: Admission: RE | Admit: 2018-06-18 | Payer: Medicare Other | Source: Ambulatory Visit

## 2018-06-18 DIAGNOSIS — C189 Malignant neoplasm of colon, unspecified: Secondary | ICD-10-CM

## 2018-06-19 ENCOUNTER — Encounter
Admission: RE | Admit: 2018-06-19 | Discharge: 2018-06-19 | Disposition: A | Discharge status: Home / Self Care | Payer: Medicare Other | Source: Ambulatory Visit | Attending: Surgery | Admitting: Surgery

## 2018-06-19 ENCOUNTER — Ambulatory Visit
Admission: RE | Admit: 2018-06-19 | Discharge: 2018-06-19 | Disposition: A | Discharge status: Home / Self Care | Payer: Medicare Other | Source: Ambulatory Visit | Attending: Surgery | Admitting: Surgery

## 2018-06-19 ENCOUNTER — Other Ambulatory Visit: Payer: Self-pay

## 2018-06-19 DIAGNOSIS — M810 Age-related osteoporosis without current pathological fracture: Secondary | ICD-10-CM | POA: Diagnosis not present

## 2018-06-19 DIAGNOSIS — F209 Schizophrenia, unspecified: Secondary | ICD-10-CM | POA: Insufficient documentation

## 2018-06-19 DIAGNOSIS — E538 Deficiency of other specified B group vitamins: Secondary | ICD-10-CM | POA: Diagnosis not present

## 2018-06-19 DIAGNOSIS — E559 Vitamin D deficiency, unspecified: Secondary | ICD-10-CM | POA: Diagnosis not present

## 2018-06-19 DIAGNOSIS — C189 Malignant neoplasm of colon, unspecified: Secondary | ICD-10-CM | POA: Diagnosis present

## 2018-06-19 DIAGNOSIS — Z833 Family history of diabetes mellitus: Secondary | ICD-10-CM | POA: Diagnosis not present

## 2018-06-19 DIAGNOSIS — K7689 Other specified diseases of liver: Secondary | ICD-10-CM | POA: Diagnosis not present

## 2018-06-19 DIAGNOSIS — Z01818 Encounter for other preprocedural examination: Secondary | ICD-10-CM | POA: Diagnosis not present

## 2018-06-19 DIAGNOSIS — E785 Hyperlipidemia, unspecified: Secondary | ICD-10-CM | POA: Diagnosis not present

## 2018-06-19 DIAGNOSIS — Z87891 Personal history of nicotine dependence: Secondary | ICD-10-CM | POA: Diagnosis not present

## 2018-06-19 DIAGNOSIS — J449 Chronic obstructive pulmonary disease, unspecified: Secondary | ICD-10-CM | POA: Insufficient documentation

## 2018-06-19 DIAGNOSIS — Z01810 Encounter for preprocedural cardiovascular examination: Secondary | ICD-10-CM

## 2018-06-19 DIAGNOSIS — D509 Iron deficiency anemia, unspecified: Secondary | ICD-10-CM | POA: Insufficient documentation

## 2018-06-19 LAB — HEMOGLOBIN A1C
Hgb A1c MFr Bld: 4.6 % — ABNORMAL LOW (ref 4.8–5.6)
Mean Plasma Glucose: 85.32 mg/dL

## 2018-06-19 LAB — TYPE AND SCREEN
ABO/RH(D): A POS
ANTIBODY SCREEN: NEGATIVE

## 2018-06-19 MED ORDER — GADOBENATE DIMEGLUMINE 529 MG/ML IV SOLN
15.0000 mL | Freq: Once | INTRAVENOUS | Status: AC | PRN
  Administered 2018-06-19: 14 mL via INTRAVENOUS

## 2018-06-19 NOTE — Patient Instructions (Signed)
Your procedure is scheduled on: Tues 9/3 Report to Day Surgery. To find out your arrival time please call [phone] between 1PM - 3PM on today.  Remember: Instructions that are not followed completely may result in serious medical risk,  up to and including death, or upon the discretion of your surgeon and anesthesiologist your  surgery may need to be rescheduled.     _X__ 1. Do not eat food after midnight the night before your procedure.                 No gum chewing or hard candies. You may drink clear liquids up to 2 hours                 before you are scheduled to arrive for your surgery- DO not drink clear                 liquids within 2 hours of the start of your surgery.                 Clear Liquids include:  water, apple juice without pulp, clear carbohydrate                 drink such as Clearfast of Gatorade, Black Coffee or Tea (Do not add                 anything to coffee or tea).  __X__2.  On the morning of surgery brush your teeth with toothpaste and water, you                may rinse your mouth with mouthwash if you wish.  Do not swallow any toothpaste of mouthwash.     ___ 3.  No Alcohol for 24 hours before or after surgery.   ___ 4.  Do Not Smoke or use e-cigarettes For 24 Hours Prior to Your Surgery.                 Do not use any chewable tobacco products for at least 6 hours prior to                 surgery.  ____  5.  Bring all medications with you on the day of surgery if instructed.   _x___  6.  Notify your doctor if there is any change in your medical condition      (cold, fever, infections).     Do not wear jewelry, make-up, hairpins, clips or nail polish. Do not wear lotions, powders, or perfumes. You may wear deodorant. Do not shave 48 hours prior to surgery. Men may shave face and neck. Do not bring valuables to the hospital.    [facility] is not responsible for any belongings or valuables.  Contacts, dentures or  bridgework may not be worn into surgery. Leave your suitcase in the car. After surgery it may be brought to your room. For patients admitted to the hospital, discharge time is determined by your treatment team.   Patients discharged the day of surgery will not be allowed to drive home.   Please read over the following fact sheets that you were given:   __x__ Take these medicines the morning of surgery with A SIP OF WATER:    1. none  2.   3.   4.  5.  6.  ____ Fleet Enema (as directed)   __x__ Use CHG Soap as directed  ____ Use inhalers on the day of surgery  ____ Stop metformin 2 days prior to surgery    ____ Take 1/2 of usual insulin dose the night before surgery. No insulin the morning          of surgery.   _x___ Stopped aspirin  last dose 8/26  ____ Stop Anti-inflammatories on    __x__ Stop supplements until after surgery.  vitamin C (ASCORBIC ACID) 500 MG tablet  ____ Bring C-Pap to the hospital.

## 2018-06-19 NOTE — Pre-Procedure Instructions (Signed)
Faxed EKG done in PAT to Dr. Clayton Bibles. Fowler for comparison per Dr. Baker Pierini request.  Dr. Ines Bloomer office notified of EKG abnormality and request for update from PCP.

## 2018-06-29 NOTE — Pre-Procedure Instructions (Signed)
Spoke with emily at dr Lysle Pearl. Not cleared by pcp. Will be rescheduled

## 2018-06-30 ENCOUNTER — Inpatient Hospital Stay: Admission: RE | Admit: 2018-06-30 | Payer: Medicare Other | Source: Ambulatory Visit | Admitting: Surgery

## 2018-06-30 ENCOUNTER — Encounter: Admission: RE | Payer: Self-pay | Source: Ambulatory Visit

## 2018-06-30 SURGERY — LAPAROSCOPIC PARTIAL COLECTOMY
Anesthesia: Choice

## 2018-07-01 NOTE — Pre-Procedure Instructions (Signed)
FAXED REQUEST TO DR Lysle Pearl TO HAVE PCP SEND WRITTEN CLEARANCE SINCE BACK ON SCHEDULE

## 2018-07-03 NOTE — Pre-Procedure Instructions (Signed)
Patient spoke with Dr. Vickki Muff this afternoon and stated she was cleared for the surgery Monday 07/06/18.  Feeling much better.

## 2018-07-06 ENCOUNTER — Other Ambulatory Visit: Payer: Self-pay

## 2018-07-06 ENCOUNTER — Inpatient Hospital Stay: Payer: Medicare Other | Admitting: Certified Registered Nurse Anesthetist

## 2018-07-06 ENCOUNTER — Encounter
Admission: RE | Disposition: A | Discharge status: Home / Self Care | Payer: Self-pay | Source: Ambulatory Visit | Attending: Surgery

## 2018-07-06 ENCOUNTER — Inpatient Hospital Stay
Admission: RE | Admit: 2018-07-06 | Discharge: 2018-07-09 | DRG: 331 | Disposition: A | Discharge status: Home / Self Care | Payer: Medicare Other | Source: Ambulatory Visit | Attending: Surgery | Admitting: Surgery

## 2018-07-06 ENCOUNTER — Encounter: Payer: Self-pay | Admitting: *Deleted

## 2018-07-06 DIAGNOSIS — Z23 Encounter for immunization: Secondary | ICD-10-CM | POA: Diagnosis not present

## 2018-07-06 DIAGNOSIS — Z87891 Personal history of nicotine dependence: Secondary | ICD-10-CM

## 2018-07-06 DIAGNOSIS — J449 Chronic obstructive pulmonary disease, unspecified: Secondary | ICD-10-CM | POA: Diagnosis present

## 2018-07-06 DIAGNOSIS — C183 Malignant neoplasm of hepatic flexure: Secondary | ICD-10-CM

## 2018-07-06 DIAGNOSIS — C182 Malignant neoplasm of ascending colon: Secondary | ICD-10-CM | POA: Diagnosis present

## 2018-07-06 DIAGNOSIS — F209 Schizophrenia, unspecified: Secondary | ICD-10-CM | POA: Diagnosis present

## 2018-07-06 DIAGNOSIS — E559 Vitamin D deficiency, unspecified: Secondary | ICD-10-CM | POA: Diagnosis present

## 2018-07-06 DIAGNOSIS — E538 Deficiency of other specified B group vitamins: Secondary | ICD-10-CM | POA: Diagnosis present

## 2018-07-06 DIAGNOSIS — Z972 Presence of dental prosthetic device (complete) (partial): Secondary | ICD-10-CM

## 2018-07-06 DIAGNOSIS — Z880 Allergy status to penicillin: Secondary | ICD-10-CM | POA: Diagnosis not present

## 2018-07-06 DIAGNOSIS — C189 Malignant neoplasm of colon, unspecified: Secondary | ICD-10-CM

## 2018-07-06 DIAGNOSIS — Z833 Family history of diabetes mellitus: Secondary | ICD-10-CM | POA: Diagnosis not present

## 2018-07-06 DIAGNOSIS — D509 Iron deficiency anemia, unspecified: Secondary | ICD-10-CM | POA: Diagnosis present

## 2018-07-06 DIAGNOSIS — Z881 Allergy status to other antibiotic agents status: Secondary | ICD-10-CM | POA: Diagnosis not present

## 2018-07-06 DIAGNOSIS — E785 Hyperlipidemia, unspecified: Secondary | ICD-10-CM | POA: Diagnosis present

## 2018-07-06 DIAGNOSIS — M810 Age-related osteoporosis without current pathological fracture: Secondary | ICD-10-CM | POA: Diagnosis present

## 2018-07-06 DIAGNOSIS — D72829 Elevated white blood cell count, unspecified: Secondary | ICD-10-CM | POA: Diagnosis present

## 2018-07-06 DIAGNOSIS — G20 Parkinson's disease: Secondary | ICD-10-CM | POA: Diagnosis present

## 2018-07-06 DIAGNOSIS — Z811 Family history of alcohol abuse and dependence: Secondary | ICD-10-CM

## 2018-07-06 DIAGNOSIS — K5901 Slow transit constipation: Secondary | ICD-10-CM | POA: Diagnosis present

## 2018-07-06 DIAGNOSIS — Z807 Family history of other malignant neoplasms of lymphoid, hematopoietic and related tissues: Secondary | ICD-10-CM | POA: Diagnosis not present

## 2018-07-06 HISTORY — PX: LAPAROSCOPIC RIGHT COLECTOMY: SHX5925

## 2018-07-06 HISTORY — DX: Malignant neoplasm of colon, unspecified: C18.9

## 2018-07-06 LAB — CBC
HCT: 36.5 % (ref 35.0–47.0)
HEMOGLOBIN: 11.9 g/dL — AB (ref 12.0–16.0)
MCH: 28.1 pg (ref 26.0–34.0)
MCHC: 32.5 g/dL (ref 32.0–36.0)
MCV: 86.5 fL (ref 80.0–100.0)
PLATELETS: 269 10*3/uL (ref 150–440)
RBC: 4.22 MIL/uL (ref 3.80–5.20)
RDW: 24.3 % — ABNORMAL HIGH (ref 11.5–14.5)
WBC: 17 10*3/uL — AB (ref 3.6–11.0)

## 2018-07-06 LAB — TYPE AND SCREEN
ABO/RH(D): A POS
Antibody Screen: NEGATIVE

## 2018-07-06 LAB — URINALYSIS, ROUTINE W REFLEX MICROSCOPIC
Bilirubin Urine: NEGATIVE
Glucose, UA: NEGATIVE mg/dL
Hgb urine dipstick: NEGATIVE
KETONES UR: NEGATIVE mg/dL
LEUKOCYTES UA: NEGATIVE
NITRITE: NEGATIVE
PH: 6 (ref 5.0–8.0)
Protein, ur: NEGATIVE mg/dL
SPECIFIC GRAVITY, URINE: 1.005 (ref 1.005–1.030)

## 2018-07-06 LAB — CREATININE, SERUM
CREATININE: 0.62 mg/dL (ref 0.44–1.00)
GFR calc Af Amer: >60 mL/min (ref >60)

## 2018-07-06 SURGERY — COLECTOMY, RIGHT, LAPAROSCOPIC
Anesthesia: General

## 2018-07-06 MED ORDER — CELECOXIB 200 MG PO CAPS
200.0000 mg | ORAL_CAPSULE | ORAL | Status: AC
  Administered 2018-07-06: 200 mg via ORAL

## 2018-07-06 MED ORDER — ROCURONIUM BROMIDE 100 MG/10ML IV SOLN
INTRAVENOUS | Status: DC | PRN
  Administered 2018-07-06: 50 mg via INTRAVENOUS
  Administered 2018-07-06: 20 mg via INTRAVENOUS

## 2018-07-06 MED ORDER — PHENYLEPHRINE HCL 10 MG/ML IJ SOLN
INTRAMUSCULAR | Status: DC | PRN
  Administered 2018-07-06: 100 ug via INTRAVENOUS

## 2018-07-06 MED ORDER — SACCHAROMYCES BOULARDII 250 MG PO CAPS
250.0000 mg | ORAL_CAPSULE | Freq: Two times a day (BID) | ORAL | Status: DC
  Filled 2018-07-06 (×6): qty 1

## 2018-07-06 MED ORDER — FENTANYL CITRATE (PF) 250 MCG/5ML IJ SOLN
INTRAMUSCULAR | Status: AC
  Filled 2018-07-06: qty 5

## 2018-07-06 MED ORDER — GABAPENTIN 300 MG PO CAPS
300.0000 mg | ORAL_CAPSULE | ORAL | Status: AC
  Administered 2018-07-06: 300 mg via ORAL

## 2018-07-06 MED ORDER — DEXAMETHASONE SODIUM PHOSPHATE 10 MG/ML IJ SOLN
INTRAMUSCULAR | Status: AC
  Filled 2018-07-06: qty 1

## 2018-07-06 MED ORDER — GABAPENTIN 300 MG PO CAPS
ORAL_CAPSULE | ORAL | Status: AC
  Filled 2018-07-06: qty 1

## 2018-07-06 MED ORDER — ONDANSETRON HCL 4 MG/2ML IJ SOLN
4.0000 mg | Freq: Four times a day (QID) | INTRAMUSCULAR | Status: DC | PRN
  Administered 2018-07-06: 4 mg via INTRAVENOUS
  Filled 2018-07-06: qty 2

## 2018-07-06 MED ORDER — BUPIVACAINE LIPOSOME 1.3 % IJ SUSP
INTRAMUSCULAR | Status: AC
  Filled 2018-07-06: qty 20

## 2018-07-06 MED ORDER — BUPIVACAINE HCL (PF) 0.5 % IJ SOLN
INTRAMUSCULAR | Status: AC
  Filled 2018-07-06: qty 30

## 2018-07-06 MED ORDER — DEXAMETHASONE SODIUM PHOSPHATE 10 MG/ML IJ SOLN
INTRAMUSCULAR | Status: DC | PRN
  Administered 2018-07-06: 10 mg via INTRAVENOUS

## 2018-07-06 MED ORDER — CHLORHEXIDINE GLUCONATE CLOTH 2 % EX PADS
6.0000 | MEDICATED_PAD | Freq: Once | CUTANEOUS | Status: DC
  Administered 2018-07-06: 6 via TOPICAL

## 2018-07-06 MED ORDER — HYDROCODONE-ACETAMINOPHEN 5-325 MG PO TABS: 1.0000 | ORAL_TABLET | Freq: Four times a day (QID) | ORAL | Status: DC | PRN

## 2018-07-06 MED ORDER — LIDOCAINE HCL (CARDIAC) PF 100 MG/5ML IV SOSY
PREFILLED_SYRINGE | INTRAVENOUS | Status: DC | PRN
  Administered 2018-07-06: 80 mg via INTRAVENOUS

## 2018-07-06 MED ORDER — ONDANSETRON HCL 4 MG/2ML IJ SOLN
INTRAMUSCULAR | Status: AC
  Filled 2018-07-06: qty 2

## 2018-07-06 MED ORDER — INFLUENZA VAC SPLIT HIGH-DOSE 0.5 ML IM SUSY
0.5000 mL | PREFILLED_SYRINGE | INTRAMUSCULAR | Status: AC
  Administered 2018-07-08: 0.5 mL via INTRAMUSCULAR
  Filled 2018-07-06 (×3): qty 0.5

## 2018-07-06 MED ORDER — ACETAMINOPHEN 500 MG PO TABS
ORAL_TABLET | ORAL | Status: AC
  Filled 2018-07-06: qty 2

## 2018-07-06 MED ORDER — ROCURONIUM BROMIDE 50 MG/5ML IV SOLN
INTRAVENOUS | Status: AC
  Filled 2018-07-06: qty 1

## 2018-07-06 MED ORDER — HYDROMORPHONE HCL 1 MG/ML IJ SOLN
INTRAMUSCULAR | Status: DC | PRN
  Administered 2018-07-06: 0.5 mg via INTRAVENOUS

## 2018-07-06 MED ORDER — THIOTHIXENE 5 MG PO CAPS
5.0000 mg | ORAL_CAPSULE | Freq: Three times a day (TID) | ORAL | Status: DC
  Administered 2018-07-06 – 2018-07-08 (×6): 5 mg via ORAL
  Filled 2018-07-06 (×8): qty 1

## 2018-07-06 MED ORDER — ENOXAPARIN SODIUM 40 MG/0.4ML ~~LOC~~ SOLN
40.0000 mg | SUBCUTANEOUS | Status: DC
  Administered 2018-07-07 – 2018-07-08 (×2): 40 mg via SUBCUTANEOUS
  Filled 2018-07-06 (×2): qty 0.4

## 2018-07-06 MED ORDER — LIDOCAINE HCL (PF) 2 % IJ SOLN
INTRAMUSCULAR | Status: AC
  Filled 2018-07-06: qty 10

## 2018-07-06 MED ORDER — CLINDAMYCIN PHOSPHATE 900 MG/50ML IV SOLN
INTRAVENOUS | Status: AC
  Filled 2018-07-06: qty 50

## 2018-07-06 MED ORDER — PROPOFOL 10 MG/ML IV BOLUS
INTRAVENOUS | Status: AC
  Filled 2018-07-06: qty 20

## 2018-07-06 MED ORDER — OXYCODONE HCL 5 MG/5ML PO SOLN: 5.0000 mg | Freq: Once | ORAL | Status: DC | PRN

## 2018-07-06 MED ORDER — SODIUM CHLORIDE 0.9 % IJ SOLN
INTRAMUSCULAR | Status: AC
  Filled 2018-07-06: qty 50

## 2018-07-06 MED ORDER — LACTATED RINGERS IV SOLN
INTRAVENOUS | Status: DC | PRN
  Administered 2018-07-06 (×2): via INTRAVENOUS

## 2018-07-06 MED ORDER — SUGAMMADEX SODIUM 200 MG/2ML IV SOLN
INTRAVENOUS | Status: AC
  Filled 2018-07-06: qty 2

## 2018-07-06 MED ORDER — TRAMADOL HCL 50 MG PO TABS: 50.0000 mg | ORAL_TABLET | Freq: Four times a day (QID) | ORAL | Status: DC | PRN

## 2018-07-06 MED ORDER — ACETAMINOPHEN 500 MG PO TABS
1000.0000 mg | ORAL_TABLET | ORAL | Status: AC
  Administered 2018-07-06: 1000 mg via ORAL

## 2018-07-06 MED ORDER — OXYCODONE HCL 5 MG PO TABS: 5.0000 mg | ORAL_TABLET | Freq: Once | ORAL | Status: DC | PRN

## 2018-07-06 MED ORDER — ENSURE SURGERY PO LIQD
237.0000 mL | Freq: Two times a day (BID) | ORAL | Status: DC
  Administered 2018-07-07 – 2018-07-08 (×2): 237 mL via ORAL
  Filled 2018-07-06: qty 237

## 2018-07-06 MED ORDER — ACETAMINOPHEN 325 MG PO TABS: 650.0000 mg | ORAL_TABLET | Freq: Four times a day (QID) | ORAL | Status: DC | PRN

## 2018-07-06 MED ORDER — ACETAMINOPHEN 10 MG/ML IV SOLN
INTRAVENOUS | Status: AC
  Filled 2018-07-06: qty 100

## 2018-07-06 MED ORDER — NON FORMULARY
5.0000 mg | Freq: Two times a day (BID) | Status: DC
  Administered 2018-07-06: 5 mg via ORAL

## 2018-07-06 MED ORDER — KETAMINE HCL 50 MG/ML IJ SOLN
INTRAMUSCULAR | Status: AC
  Filled 2018-07-06: qty 10

## 2018-07-06 MED ORDER — CELECOXIB 200 MG PO CAPS
ORAL_CAPSULE | ORAL | Status: AC
  Filled 2018-07-06: qty 1

## 2018-07-06 MED ORDER — ONDANSETRON HCL 4 MG/2ML IJ SOLN
INTRAMUSCULAR | Status: DC | PRN
  Administered 2018-07-06: 4 mg via INTRAVENOUS

## 2018-07-06 MED ORDER — SUGAMMADEX SODIUM 200 MG/2ML IV SOLN
INTRAVENOUS | Status: DC | PRN
  Administered 2018-07-06: 140 mg via INTRAVENOUS

## 2018-07-06 MED ORDER — BUPIVACAINE LIPOSOME 1.3 % IJ SUSP
INTRAMUSCULAR | Status: DC | PRN
  Administered 2018-07-06: 50 mL

## 2018-07-06 MED ORDER — KETAMINE HCL 10 MG/ML IJ SOLN
INTRAMUSCULAR | Status: DC | PRN
  Administered 2018-07-06: 50 mg via INTRAVENOUS

## 2018-07-06 MED ORDER — MORPHINE SULFATE (PF) 2 MG/ML IV SOLN: 2.0000 mg | INTRAVENOUS | Status: DC | PRN

## 2018-07-06 MED ORDER — HYDROMORPHONE HCL 1 MG/ML IJ SOLN
INTRAMUSCULAR | Status: AC
  Filled 2018-07-06: qty 1

## 2018-07-06 MED ORDER — LACTATED RINGERS IV SOLN
INTRAVENOUS | Status: DC
  Administered 2018-07-06: 19:00:00 via INTRAVENOUS

## 2018-07-06 MED ORDER — GENTAMICIN SULFATE 40 MG/ML IJ SOLN
5.0000 mg/kg | Freq: Once | INTRAVENOUS | Status: DC
  Filled 2018-07-06 (×2): qty 8.75

## 2018-07-06 MED ORDER — FENTANYL CITRATE (PF) 100 MCG/2ML IJ SOLN: 25.0000 ug | INTRAMUSCULAR | Status: DC | PRN

## 2018-07-06 MED ORDER — PROPOFOL 10 MG/ML IV BOLUS
INTRAVENOUS | Status: DC | PRN
  Administered 2018-07-06: 120 mg via INTRAVENOUS

## 2018-07-06 MED ORDER — CLINDAMYCIN PHOSPHATE 900 MG/50ML IV SOLN: 900.0000 mg | Freq: Once | INTRAVENOUS | Status: DC

## 2018-07-06 MED ORDER — FENTANYL CITRATE (PF) 100 MCG/2ML IJ SOLN
INTRAMUSCULAR | Status: DC | PRN
  Administered 2018-07-06 (×2): 50 ug via INTRAVENOUS
  Administered 2018-07-06: 100 ug via INTRAVENOUS
  Administered 2018-07-06: 50 ug via INTRAVENOUS

## 2018-07-06 MED ORDER — ONDANSETRON HCL 4 MG PO TABS: 4.0000 mg | ORAL_TABLET | Freq: Four times a day (QID) | ORAL | Status: DC | PRN

## 2018-07-06 SURGICAL SUPPLY — 61 items
BLADE SURG SZ10 CARB STEEL (BLADE) ×3 IMPLANT
CANISTER SUCT 1200ML W/VALVE (MISCELLANEOUS) ×3 IMPLANT
DECANTER SPIKE VIAL GLASS SM (MISCELLANEOUS) ×3 IMPLANT
DEFOGGER SCOPE WARMER CLEARIFY (MISCELLANEOUS) ×3 IMPLANT
DERMABOND ADVANCED (GAUZE/BANDAGES/DRESSINGS) ×4
DERMABOND ADVANCED .7 DNX12 (GAUZE/BANDAGES/DRESSINGS) ×2 IMPLANT
DRSG OPSITE POSTOP 4X8 (GAUZE/BANDAGES/DRESSINGS) ×3 IMPLANT
DRSG TEGADERM 2-3/8X2-3/4 SM (GAUZE/BANDAGES/DRESSINGS) ×3 IMPLANT
ELECT CAUTERY BLADE 6.4 (BLADE) ×3 IMPLANT
ELECT REM PT RETURN 9FT ADLT (ELECTROSURGICAL) ×3
ELECTRODE REM PT RTRN 9FT ADLT (ELECTROSURGICAL) ×1 IMPLANT
GLOVE BIO SURGEON STRL SZ 6.5 (GLOVE) ×8 IMPLANT
GLOVE BIO SURGEONS STRL SZ 6.5 (GLOVE) ×4
GOWN STRL REUS W/ TWL LRG LVL3 (GOWN DISPOSABLE) ×4 IMPLANT
GOWN STRL REUS W/TWL LRG LVL3 (GOWN DISPOSABLE) ×8
HANDLE SUCTION POOLE (INSTRUMENTS) ×1 IMPLANT
HANDLE YANKAUER SUCT BULB TIP (MISCELLANEOUS) ×3 IMPLANT
HOLDER FOLEY CATH W/STRAP (MISCELLANEOUS) ×3 IMPLANT
IRRIGATION STRYKERFLOW (MISCELLANEOUS) IMPLANT
IRRIGATOR STRYKERFLOW (MISCELLANEOUS)
LABEL OR SOLS (LABEL) ×3 IMPLANT
NEEDLE HYPO 22GX1.5 SAFETY (NEEDLE) ×3 IMPLANT
NS IRRIG 1000ML POUR BTL (IV SOLUTION) ×3 IMPLANT
PACK COLON CLEAN CLOSURE (MISCELLANEOUS) ×3 IMPLANT
PACK LAP CHOLECYSTECTOMY (MISCELLANEOUS) ×3 IMPLANT
PENCIL ELECTRO HAND CTR (MISCELLANEOUS) ×3 IMPLANT
RELOAD PROXIMATE 75MM BLUE (ENDOMECHANICALS) ×3 IMPLANT
RELOAD PROXIMATE TA60MM BLUE (ENDOMECHANICALS) ×6 IMPLANT
RETRACTOR WOUND ALXS 18CM MED (MISCELLANEOUS) IMPLANT
RTRCTR WOUND ALEXIS O 18CM MED (MISCELLANEOUS)
SCISSORS METZENBAUM CVD 33 (INSTRUMENTS) IMPLANT
SET BI-LUMEN FLTR TB AIRSEAL (TUBING) ×3 IMPLANT
SHEARS HARMONIC ACE PLUS 36CM (ENDOMECHANICALS) ×3 IMPLANT
SLEEVE ENDOPATH XCEL 5M (ENDOMECHANICALS) IMPLANT
SPONGE GAUZE 2X2 8PLY STER LF (GAUZE/BANDAGES/DRESSINGS) ×1
SPONGE GAUZE 2X2 8PLY STRL LF (GAUZE/BANDAGES/DRESSINGS) ×2 IMPLANT
SPONGE LAP 18X18 RF (DISPOSABLE) ×6 IMPLANT
SPONGE LAP 18X36 RFD (DISPOSABLE) ×3 IMPLANT
STAPLER GUN LINEAR PROX 60 (STAPLE) IMPLANT
STAPLER PROXIMATE 55 BLUE (STAPLE) ×3 IMPLANT
STAPLER PROXIMATE 75MM BLUE (STAPLE) ×3 IMPLANT
SUCTION POOLE HANDLE (INSTRUMENTS) ×3
SURGILUBE 2OZ TUBE FLIPTOP (MISCELLANEOUS) ×3 IMPLANT
SUT MNCRL 4-0 (SUTURE) ×4
SUT MNCRL 4-0 27XMFL (SUTURE) ×2
SUT PDS AB 1 TP1 96 (SUTURE) ×3 IMPLANT
SUT SILK 2 0 (SUTURE) ×2
SUT SILK 2 0 SH CR/8 (SUTURE) ×3 IMPLANT
SUT SILK 2-0 30XBRD TIE 12 (SUTURE) ×1 IMPLANT
SUT SILK 3 0 (SUTURE) ×2
SUT SILK 3 0 SH 30 (SUTURE) ×3 IMPLANT
SUT SILK 3-0 18XBRD TIE 12 (SUTURE) ×1 IMPLANT
SUT VICRYL 3-0 CR8 SH (SUTURE) ×3 IMPLANT
SUTURE MNCRL 4-0 27XMF (SUTURE) ×2 IMPLANT
SYR 30ML LL (SYRINGE) ×3 IMPLANT
SYS LAPSCP GELPORT 120MM (MISCELLANEOUS) ×3
SYSTEM LAPSCP GELPORT 120MM (MISCELLANEOUS) ×1 IMPLANT
TRAY FOLEY SLVR 16FR LF STAT (SET/KITS/TRAYS/PACK) ×3 IMPLANT
TROCAR PORT AIRSEAL 5X120 (TROCAR) ×3 IMPLANT
TROCAR XCEL NON-BLD 5MMX100MML (ENDOMECHANICALS) ×3 IMPLANT
TUBING INSUF HEATED (TUBING) ×6 IMPLANT

## 2018-07-06 NOTE — Op Note (Signed)
Preoperative diagnosis: Carcinoma of ascending colon  Postoperative diagnosis: Carcinoma of ascending colon  Procedure: Laparoscopic right extended hemicolectomy with primary anastomosis.   Anesthesia: GETA  Surgeon: Lysle Pearl Assistant: Cintron-Diaz  Wound Classification: Clean Contaminated  Indications: Patient is a 72 y.o. female with  carcinoma involving the ascending colon. Resection was indicated for oncologic treatment.   Specimen: Right Colon  Complications: None  Estimated Blood Loss:  75 mL  Findings: 1. Palpable mass at the hepatic flexure 2. Tension free anastomosis 3. Adequate gross circulation on anastomosis 4. No gross metastatic disease identified 5. Adequate hemostasis  Details of operation: The patient was brought to the operating room and placed on the operating table in the supine position. The abdomen was clipped, prepped with 2% chlorhexidine solution, and draped in the standard fashion. Foley placed.  A time-out was completed verifying correct patient, procedure, site, positioning, and implant(s) and/or special equipment prior to beginning this procedure. A vertical 5 cm periumbilical incision was used to access the peritoneal cavity and a Gelport was placed.  Under direct visualization a 24mm subxyphoid trocar was introduced and pneumoperitoneum was achieved. Another 5 mm trocar was introduced on the LUQ area. The patient was then positioned in 30 reverse Trendelenburg position with the patient's right side up to allow for small bowel to drop clear of the operative field. The 5-mm scope was introduced from the epigastric port at the initial part of the procedure. With a hand assisted technique, the transverse colon was grasped with the right hand of the operating surgeon and lifted up to tent the mesentary and omentum.  The dissection was then started at the area distal to the distal ink to separate the omentum from the transverse colon.  Once omentum was dissected  off, the lateral attachments around the hepatic flexure down along the white line of toldt was taken down using the harmonic through via the laparaoscopic technique.  Once the right colon was mobilized and pulled through the hand port, a hole was made in the mesentary where the omental dissection begun.  Transverse colon then divided using GIA vascular load.  Point of transaction was distal to the middle colic artery. The mesentary was then divided using harmonic and suture ligation of the palpable vessels with 3-0 silk.  Care was noted to not injure the duodenum during this portion.   Dissection then continued on the right side of the superior mesenteric artery leading to the identification of the right colic artery and vein which were then gently dissected and suture ligated as well.   The ileum was divided at least 16XW from the ileocolic valve and using a GIA device. The mesenteric dissection was completed as needed on the small bowel and colonic sides and the specimen resection was thus completed. The specimen was removed. The two ends of the ileum and transverse colon were then aligned, ensuring that the mesentery was not twisted, and a stapled end-to-end anastomosis was created in the usual manner using 72mm stapler.  The new staple line anastamosis was lembert sutured. The mesenteric defect was then closed with Vicryl 2-0 sutures and the bowel reintroduced into the peritoneal cavity.  No bleeding or injury noted with one last inspection laparoscopically before the hand port and trocars removed.  Gloves and instruments were switched out and the fascia was closed with running PDS 0 x2.   Exparel infused to incisions prior to closing.  3-0 vicryl used to close subcutaneous layer at the hand port incision prior to closing all  skin incisions with stapler.  Wounds then dressed with honeycomb sponge, 2x2, and tegarderm.   The sponge and instrument count  The patient tolerated the procedure well and was  transferred to the post-anesthesia care unit in stable condition.  Foley removed prior to transfer.

## 2018-07-06 NOTE — Anesthesia Procedure Notes (Signed)
Procedure Name: Intubation Date/Time: 07/06/2018 1:50 PM Performed by: Eben Burow, CRNA Pre-anesthesia Checklist: Patient identified, Timeout performed, Emergency Drugs available, Suction available and Patient being monitored Patient Re-evaluated:Patient Re-evaluated prior to induction Oxygen Delivery Method: Circle system utilized Preoxygenation: Pre-oxygenation with 100% oxygen Induction Type: IV induction Ventilation: Mask ventilation without difficulty Laryngoscope Size: McGraph and 3 Grade View: Grade I Tube type: Oral Tube size: 7.0 mm Number of attempts: 1 Airway Equipment and Method: Stylet,  Video-laryngoscopy and LTA kit utilized Placement Confirmation: positive ETCO2 and breath sounds checked- equal and bilateral Secured at: 21 cm Tube secured with: Tape Dental Injury: Teeth and Oropharynx as per pre-operative assessment

## 2018-07-06 NOTE — Anesthesia Preprocedure Evaluation (Addendum)
Anesthesia Evaluation  Patient identified by MRN, date of birth, ID band Patient awake    Reviewed: Allergy & Precautions, H&P , NPO status , Patient's Chart, lab work & pertinent test results  History of Anesthesia Complications Negative for: history of anesthetic complications  Airway Mallampati: III  TM Distance: <3 FB Neck ROM: limited    Dental  (+) Chipped, Poor Dentition, Missing   Pulmonary neg shortness of breath, COPD, former smoker,           Cardiovascular Exercise Tolerance: Good (-) angina(-) Past MI and (-) DOE negative cardio ROS       Neuro/Psych PSYCHIATRIC DISORDERS negative neurological ROS     GI/Hepatic negative GI ROS, Neg liver ROS,   Endo/Other  negative endocrine ROS  Renal/GU      Musculoskeletal  (+) Arthritis ,   Abdominal   Peds  Hematology negative hematology ROS (+)   Anesthesia Other Findings Past Medical History: No date: Arthritis     Comment:  hips No date: B12 deficiency No date: Constipation due to slow transit No date: COPD (chronic obstructive pulmonary disease) (HCC) No date: Fibrocystic breast disease No date: Mediastinal mass     Comment:  removed 1/16 No date: Motion sickness     Comment:  fair rides No date: Osteoporosis No date: Schizophrenia ([place]) No date: Vitamin D deficiency No date: Wears dentures     Comment:  partial upper and lower  Past Surgical History: No date: BREAST BIOPSY; Left     Comment:  neg 05/04/13: BREAST BIOPSY; Right     Comment:  [place] bx/clip-neg No date: COLONOSCOPY 06/10/2018: COLONOSCOPY WITH PROPOFOL; N/A     Comment:  Procedure: COLONOSCOPY WITH PROPOFOL;  Surgeon: [place],               [name], MD;  Location: [facility];  Service:               Gastroenterology;  Laterality: N/A; 06/10/2018: ESOPHAGOGASTRODUODENOSCOPY; N/A     Comment:  Procedure: ESOPHAGOGASTRODUODENOSCOPY (EGD);  Surgeon:               T[place], [name], MD;  Location: [facility];                Service: Gastroenterology;  Laterality: N/A; 11/20/2016: HALLUX VALGUS AKIN; Right     Comment:  Procedure: HALLUX VALGUS AKIN;  Surgeon: Samara Deist,               DPM;  Location: [place];  Service: Podiatry;               Laterality: Right; 11/20/2016: [place]; Right     Comment:  Procedure: HALLUX VALGUS AUSTIN  Alroy Dust) right;                Surgeon: Samara Deist, DPM;  Location: [place];  Service: Podiatry;  Laterality: Right;  IVA with               Popliteal 01/15/2017: HALLUX VALGUS AUSTIN; Left     Comment:  Procedure: HALLUX VALGUS AUSTIN  Correction left foot                Iva Popiteal;  Surgeon: Samara Deist, DPM;  Location:               [place];  Service: Podiatry;  Laterality:  Left;  IVA Popliteal  11/09/2014: MEDIASTINAL MASS EXCISION; Left     Comment:  Dr. Wynelle Cleveland, [place] 11/09/2014: THORACOSCOPY; Left     Comment:  with excision mediastinal mass No date: TUBAL LIGATION  BMI    Body Mass Index:  26.60 kg/m      Reproductive/Obstetrics negative OB ROS                             Anesthesia Physical Anesthesia Plan  ASA: III  Anesthesia Plan: General ETT   Post-op Pain Management:    Induction: Intravenous  PONV Risk Score and Plan: Ondansetron, Dexamethasone, Midazolam and Treatment may vary due to age or medical condition  Airway Management Planned: Oral ETT  Additional Equipment:   Intra-op Plan:   Post-operative Plan: Extubation in OR  Informed Consent: I have reviewed the patients History and Physical, chart, labs and discussed the procedure including the risks, benefits and alternatives for the proposed anesthesia with the patient or authorized representative who has indicated his/her understanding and acceptance.   Dental Advisory Given  Plan Discussed with: Anesthesiologist, CRNA and  Surgeon  Anesthesia Plan Comments: (Patient consented for risks of anesthesia including but not limited to:  - adverse reactions to medications - damage to teeth, lips or other oral mucosa - sore throat or hoarseness - Damage to heart, brain, lungs or loss of life  Patient voiced understanding.)        Anesthesia Quick Evaluation

## 2018-07-06 NOTE — Anesthesia Post-op Follow-up Note (Signed)
Anesthesia QCDR form completed.        

## 2018-07-06 NOTE — Interval H&P Note (Signed)
History and Physical Interval Note:  07/06/2018 1:32 PM  [name]  has presented today for surgery, with the diagnosis of malignant neoplasm of ascending colon  The various methods of treatment have been discussed with the patient and family. After consideration of risks, benefits and other options for treatment, the patient has consented to  Procedure(s): LAPAROSCOPIC COLECTOMY (N/A) as a surgical intervention .  The patient's history has been reviewed, patient examined, no change in status, stable for surgery.  I have reviewed the patient's chart and labs.  Questions were answered to the patient's satisfaction.     Kathy [name]

## 2018-07-06 NOTE — Transfer of Care (Addendum)
Immediate Anesthesia Transfer of Care Note  Patient: Kathy [name]  Procedure(s) Performed: LAPAROSCOPIC COLECTOMY (N/A )  Patient Location: PACU  Anesthesia Type:General  Level of Consciousness: sedated  Airway & Oxygen Therapy: Patient Spontanous Breathing and Patient connected to face mask oxygen  Post-op Assessment: Report given to RN and Post -op Vital signs reviewed and stable  Post vital signs: Reviewed and stable  Last Vitals:  Vitals Value Taken Time  BP 140/57 07/06/2018  4:58 PM  Temp    Pulse 62 07/06/2018  5:00 PM  Resp 16 07/06/2018  5:00 PM  SpO2 100 % 07/06/2018  5:00 PM  Vitals shown include unvalidated device data.  Last Pain:  Vitals:   07/06/18 1207  TempSrc: Oral  PainSc: 0-No pain         Complications: No apparent anesthesia complications

## 2018-07-06 NOTE — Progress Notes (Signed)
Patient arrived from pacu.

## 2018-07-06 NOTE — Progress Notes (Signed)
Patient really wanting to take her procyclidine while she is here.  Order received from Dr Lysle Pearl for the patient to take her med from home

## 2018-07-07 ENCOUNTER — Encounter: Payer: Self-pay | Admitting: Surgery

## 2018-07-07 LAB — CBC
HEMATOCRIT: 35.9 % (ref 35.0–47.0)
HEMOGLOBIN: 11.9 g/dL — AB (ref 12.0–16.0)
MCH: 28.4 pg (ref 26.0–34.0)
MCHC: 33.2 g/dL (ref 32.0–36.0)
MCV: 85.5 fL (ref 80.0–100.0)
Platelets: 260 10*3/uL (ref 150–440)
RBC: 4.2 MIL/uL (ref 3.80–5.20)
RDW: 23.7 % — ABNORMAL HIGH (ref 11.5–14.5)
WBC: 14.8 10*3/uL — ABNORMAL HIGH (ref 3.6–11.0)

## 2018-07-07 LAB — BASIC METABOLIC PANEL
ANION GAP: 9 (ref 5–15)
BUN: 10 mg/dL (ref 8–23)
CHLORIDE: 108 mmol/L (ref 98–111)
CO2: 23 mmol/L (ref 22–32)
Calcium: 8.8 mg/dL — ABNORMAL LOW (ref 8.9–10.3)
Creatinine, Ser: 0.6 mg/dL (ref 0.44–1.00)
GFR calc Af Amer: >60 mL/min (ref >60)
GFR calc non Af Amer: >60 mL/min (ref >60)
GLUCOSE: 129 mg/dL — AB (ref 70–99)
POTASSIUM: 3.8 mmol/L (ref 3.5–5.1)
Sodium: 140 mmol/L (ref 135–145)

## 2018-07-07 LAB — PHOSPHORUS: Phosphorus: 3.5 mg/dL (ref 2.5–4.6)

## 2018-07-07 LAB — MAGNESIUM: Magnesium: 1.8 mg/dL (ref 1.7–2.4)

## 2018-07-07 MED ORDER — VITAMIN D 1000 UNITS PO TABS
2000.0000 [IU] | ORAL_TABLET | Freq: Every day | ORAL | Status: DC
  Filled 2018-07-07 (×2): qty 2

## 2018-07-07 MED ORDER — VITAMIN B-12 1000 MCG PO TABS
1000.0000 ug | ORAL_TABLET | Freq: Every day | ORAL | Status: DC
  Filled 2018-07-07: qty 1

## 2018-07-07 MED ORDER — NON FORMULARY
3.0000 | Freq: Every day | Status: DC
  Administered 2018-07-07 – 2018-07-08 (×2): 3 via ORAL

## 2018-07-07 MED ORDER — KCL IN DEXTROSE-NACL 40-5-0.45 MEQ/L-%-% IV SOLN
INTRAVENOUS | Status: DC
  Administered 2018-07-07: 09:00:00 via INTRAVENOUS
  Filled 2018-07-07 (×2): qty 1000

## 2018-07-07 NOTE — Progress Notes (Signed)
Subjective:  CC:  Kathy [name] is a 72 y.o. female  Hospital stay day 1, 1 Day Post-Op lap hand-assisted extended right hemicolectomy for colon CA  HPI: Pt is doing well.  Voided x2, not requiring any pain meds.  Denies any flatus or BM yet but feels very hungry.  ROS:  A 5 point review of systems was performed and pertinent positives and negatives noted in HPI.   Objective:      Temp:  [97.5 F (36.4 C)-98.6 F (37 C)] 97.7 F (36.5 C) (09/17 0414) Pulse Rate:  [59-84] 84 (09/17 0414) Resp:  [12-28] 20 (09/17 0414) BP: (128-151)/(52-69) 151/67 (09/17 0414) SpO2:  [93 %-100 %] 95 % (09/17 0414) Weight:  [70.3 kg-70.6 kg] 70.6 kg (09/17 0414)     Height: 5\' 4"  (162.6 cm) Weight: 70.6 kg BMI (Calculated): 26.7   Intake/Output this shift:  Total I/O In: 192.5 [I.V.:192.5] Out: -        Constitutional :  alert, cooperative, appears stated age and no distress  Gastrointestinal: soft, non-tender; bowel sounds normal; no masses,  no organomegaly.   Skin: Cool and moist. Midline and port incision dressings intact, no soaking through.  No tenderness around incision sites  Psychiatric: Normal affect, non-agitated, not confused       LABS:  CMP Latest Ref Rng & Units 07/07/2018 07/06/2018 09/05/2014  Glucose 70 - 99 mg/dL 129(H) - -  BUN 8 - 23 mg/dL 10 - -  Creatinine 0.44 - 1.00 mg/dL 0.60 0.62 0.74  Sodium 135 - 145 mmol/L 140 - -  Potassium 3.5 - 5.1 mmol/L 3.8 - -  Chloride 98 - 111 mmol/L 108 - -  CO2 22 - 32 mmol/L 23 - -  Calcium 8.9 - 10.3 mg/dL 8.8(L) - -   CBC Latest Ref Rng & Units 07/07/2018 07/06/2018  WBC 3.6 - 11.0 K/uL 14.8(H) 17.0(H)  Hemoglobin 12.0 - 16.0 g/dL 11.9(L) 11.9(L)  Hematocrit 35.0 - 47.0 % 35.9 36.5  Platelets 150 - 440 K/uL 260 269    RADS: n/a Assessment:   Hospital stay day 1, 1 Day Post-Op lap hand-assisted extended right hemicolectomy for colon CA.  Doing well.  Ensure for this am. If tolerates, will consider advancing to soft,  low-fiber diet.  Switch to MIVF for now. Lovenox, SCDs for DVT prophy.  Parkinson's-continue home meds  Leukocytosis-likely reactive to surgery.  No concerns at this time.

## 2018-07-07 NOTE — Progress Notes (Signed)
Interval progress:  Pt states ensure made her have epigastric pain and left shoulder pain.  Denies nausea.  Tolerated jello and italian ice afterwards for lunch.  No pain.  Abd exam still soft, non-tender.  Will continue clears for the rest of the day, and then try ensure again tomorrow.  Pt requested restarting vitamins.  Ordered.  Ok to have hard candy and gum as well.

## 2018-07-08 NOTE — Progress Notes (Signed)
Subjective:  CC:  Kathy [name] is a 72 y.o. female  Hospital stay day 2, 2 Days Post-Op lap hand-assisted extended right hemicolectomy for colon CA  HPI: Pt is still doing well. Still not requiring any pain meds.  Denies any flatus or BM yet with no further episodes of nausea.  ROS:  A 5 point review of systems was performed and pertinent positives and negatives noted in HPI.   Objective:      Temp:  [97.5 F (36.4 C)-98.2 F (36.8 C)] 97.5 F (36.4 C) (09/18 1115) Pulse Rate:  [75-89] 89 (09/18 1115) Resp:  [18] 18 (09/18 1115) BP: (133-151)/(57-70) 133/65 (09/18 1115) SpO2:  [97 %-100 %] 98 % (09/18 1115) Weight:  [69.9 kg] 69.9 kg (09/18 0540)     Height: 5\' 4"  (162.6 cm) Weight: 69.9 kg BMI (Calculated): 26.44   Intake/Output this shift:  Total I/O In: 120 [P.O.:120] Out: -        Constitutional :  alert, cooperative, appears stated age and no distress  Gastrointestinal: soft, non-tender; bowel sounds normal; no masses,  no organomegaly.   Skin: Cool and moist. Midline and port incision dressings intact, no soaking through.  No tenderness around incision sites  Psychiatric: Normal affect, non-agitated, not confused       LABS:  CMP Latest Ref Rng & Units 07/07/2018 07/06/2018 09/05/2014  Glucose 70 - 99 mg/dL 129(H) - -  BUN 8 - 23 mg/dL 10 - -  Creatinine 0.44 - 1.00 mg/dL 0.60 0.62 0.74  Sodium 135 - 145 mmol/L 140 - -  Potassium 3.5 - 5.1 mmol/L 3.8 - -  Chloride 98 - 111 mmol/L 108 - -  CO2 22 - 32 mmol/L 23 - -  Calcium 8.9 - 10.3 mg/dL 8.8(L) - -   CBC Latest Ref Rng & Units 07/07/2018 07/06/2018  WBC 3.6 - 11.0 K/uL 14.8(H) 17.0(H)  Hemoglobin 12.0 - 16.0 g/dL 11.9(L) 11.9(L)  Hematocrit 35.0 - 47.0 % 35.9 36.5  Platelets 150 - 440 K/uL 260 269    RADS: n/a Assessment:   Hospital stay day 2, 2 Day Post-Op lap hand-assisted extended right hemicolectomy for colon CA.  Doing well.  Advance to soft, low-fiber diet.  D/C MIVF.  Lovenox, SCDs for  DVT prophy.  Parkinson's-continue home meds

## 2018-07-09 MED ORDER — DOCUSATE SODIUM 100 MG PO CAPS: 100.0000 mg | ORAL_CAPSULE | Freq: Two times a day (BID) | ORAL | 0 refills | Status: AC | PRN

## 2018-07-09 MED ORDER — HYDROCODONE-ACETAMINOPHEN 5-325 MG PO TABS: 1.0000 | ORAL_TABLET | Freq: Four times a day (QID) | ORAL | 0 refills | Status: AC | PRN

## 2018-07-09 MED ORDER — ACETAMINOPHEN 325 MG PO TABS: 650.0000 mg | ORAL_TABLET | Freq: Four times a day (QID) | ORAL | 0 refills | Status: AC | PRN

## 2018-07-09 MED ORDER — IBUPROFEN 800 MG PO TABS: 800.0000 mg | ORAL_TABLET | Freq: Three times a day (TID) | ORAL | 0 refills | Status: DC | PRN

## 2018-07-09 NOTE — Discharge Instructions (Signed)
-   tylenol and advil as needed for discomfort.  Please alternate between the two every four hours as needed for pain.   - Use narcotics, if prescribed, only when tylenol and motrin is not enough to control pain. - 650mg  every 8hrs to max of 4000mg /24hrs (including the 325mg  in every norco dose) for the tylenol.   - Advil up to 800mg  per dose every 8hrs as needed for pain.   - Remove dressing over staples on Sunday.  No need to recover staples unless catching on clothes or bothering you - Staples will be removed at the followup appointment if wound looks healed - Ok to shower and have soap and water run over staples - DO NOT take laxatives.  OK to take the prescribed stool softners as needed

## 2018-07-09 NOTE — Anesthesia Postprocedure Evaluation (Signed)
Anesthesia Post Note  Patient: Kathy [name]  Procedure(s) Performed: LAPAROSCOPIC COLECTOMY (N/A )  Patient location during evaluation: PACU Anesthesia Type: General Level of consciousness: awake and alert Pain management: pain level controlled Vital Signs Assessment: post-procedure vital signs reviewed and stable Respiratory status: spontaneous breathing, nonlabored ventilation and respiratory function stable Cardiovascular status: blood pressure returned to baseline and stable Postop Assessment: no apparent nausea or vomiting Anesthetic complications: no     Last Vitals:  Vitals:   07/08/18 2144 07/09/18 0446  BP: (!) 130/52 (!) 147/61  Pulse: 98 90  Resp: 19 19  Temp: 36.8 C 36.9 C  SpO2: 94% 93%    Last Pain:  Vitals:   07/09/18 0446  TempSrc: Oral  PainSc:                  [name]

## 2018-07-09 NOTE — Discharge Summary (Signed)
Physician Discharge Summary  Patient ID: Kathy [name] MRN: [id number] DOB/AGE: [id number] 71 y.o.  Admit date: 07/06/2018 Discharge date: 07/09/2018  Admission Diagnoses: Ascending colon cancer to hepatic flexure History of Parkinson's  Discharge Diagnoses:  Same as above  Discharged Condition: good  Hospital Course: Patient admitted for elective laparoscopic hand-assisted extended right hemicolectomy for previously diagnosed ascending colon cancer at the hepatic flexure.  ERAS protocol was initiated and patient remained in the hospital through postop day 3.  Patient required no intravenous or oral narcotics, tolerated clears on postop day 1 advance to soft's by postop day 2 passing flatus on postop day 3 remaining asymptomatic.  At time of discharge patient was in no distress remained comfortable incisions were clean dry and intact and therefore deemed appropriate for discharge with close follow-up as an outpatient basis.  She will follow-up in the office for wound check and staple removal on postop day 10.  Consults: None  Discharge Exam: Blood pressure (!) 147/61, pulse 90, temperature 98.5 F (36.9 C), temperature source Oral, resp. rate 19, height 5\' 4"  (1.626 m), weight 73 kg, SpO2 93 %. General appearance: alert, cooperative and no distress Resp: clear to auscultation bilaterally Cardio: regular rate and rhythm GI: soft, non-tender; bowel sounds normal; no masses,  no organomegaly; staples c/d/i.  No evidence of SSI  Disposition:  Discharge disposition: 01-Home or Self Care       Discharge Instructions    Discharge patient   Complete by:  As directed    Discharge disposition:  01-Home or Self Care   Discharge patient date:  07/09/2018     Allergies as of 07/09/2018      Reactions   Biaxin [clarithromycin] Shortness Of Breath   Levofloxacin    Muscle pain   Penicillins Rash   Has patient had a PCN reaction causing immediate rash, facial/tongue/throat swelling, SOB  or lightheadedness with hypotension: no Has patient had a PCN reaction causing severe rash involving mucus membranes or skin necrosis: no Has patient had a PCN reaction that required hospitalization: no Has patient had a PCN reaction occurring within the last 10 years: no If all of the above answers are "NO", then may proceed with Cephalosporin use.      Medication List    TAKE these medications   acetaminophen 325 MG tablet Commonly known as:  TYLENOL Take 2 tablets (650 mg total) by mouth every 6 (six) hours as needed for up to 7 days for mild pain, moderate pain or fever (fever > 101).   ASPIRIN 81 PO Take 81 mg by mouth daily.   conjugated estrogens vaginal cream Commonly known as:  PREMARIN Place 1 Applicatorful vaginally 3 (three) times a week.   docusate sodium 100 MG capsule Commonly known as:  COLACE Take 1 capsule (100 mg total) by mouth 2 (two) times daily as needed for up to 10 days for mild constipation.   Ferrous Fumarate 324 (106 Fe) MG Tabs tablet Commonly known as:  HEMOCYTE - 106 mg FE Take 1 tablet by mouth 2 (two) times daily.   HYDROcodone-acetaminophen 5-325 MG tablet Commonly known as:  NORCO/VICODIN Take 1 tablet by mouth every 6 (six) hours as needed for up to 3 days for moderate pain.   ibuprofen 800 MG tablet Commonly known as:  ADVIL,MOTRIN Take 1 tablet (800 mg total) by mouth every 8 (eight) hours as needed for mild pain or moderate pain.   PROCYCLIDINE HCL PO Take 5 mg by mouth 3 (three) times  daily. (Kemadrine)   thiothixene 5 MG capsule Commonly known as:  NAVANE Take 5 mg by mouth 3 (three) times daily.   vitamin B-12 1000 MCG tablet Commonly known as:  CYANOCOBALAMIN Take 1 tablet by mouth daily.   vitamin C 500 MG tablet Commonly known as:  ASCORBIC ACID Take 500 mg by mouth 3 (three) times daily.   Vitamin D3 2000 units Tabs Take 1 tablet by mouth daily.      Follow-up Information    Vinco, Viyaan Champine, DO. Schedule an  appointment as soon as possible for a visit in 8 day(s).   Specialty:  Surgery Contact information: [place] [address] [phone]            Total time spent arranging discharge was >13min. Signed: Benjamine Sprague 07/09/2018, 7:32 AM

## 2018-07-15 LAB — SURGICAL PATHOLOGY

## 2018-07-21 ENCOUNTER — Ambulatory Visit: Payer: Self-pay | Admitting: Surgery

## 2018-07-23 ENCOUNTER — Inpatient Hospital Stay: Payer: Medicare Other | Attending: Oncology | Admitting: Oncology

## 2018-07-23 ENCOUNTER — Encounter: Payer: Self-pay | Admitting: Oncology

## 2018-07-23 VITALS — BP 114/75 | HR 89 | Temp 98.2°F | Resp 18 | Ht 64.0 in | Wt 157.1 lb

## 2018-07-23 DIAGNOSIS — M129 Arthropathy, unspecified: Secondary | ICD-10-CM | POA: Diagnosis not present

## 2018-07-23 DIAGNOSIS — Z87891 Personal history of nicotine dependence: Secondary | ICD-10-CM | POA: Diagnosis not present

## 2018-07-23 DIAGNOSIS — F209 Schizophrenia, unspecified: Secondary | ICD-10-CM | POA: Insufficient documentation

## 2018-07-23 DIAGNOSIS — D1803 Hemangioma of intra-abdominal structures: Secondary | ICD-10-CM | POA: Diagnosis not present

## 2018-07-23 DIAGNOSIS — Z79899 Other long term (current) drug therapy: Secondary | ICD-10-CM | POA: Diagnosis not present

## 2018-07-23 DIAGNOSIS — Z7982 Long term (current) use of aspirin: Secondary | ICD-10-CM | POA: Diagnosis not present

## 2018-07-23 DIAGNOSIS — J449 Chronic obstructive pulmonary disease, unspecified: Secondary | ICD-10-CM | POA: Diagnosis not present

## 2018-07-23 DIAGNOSIS — E559 Vitamin D deficiency, unspecified: Secondary | ICD-10-CM

## 2018-07-23 DIAGNOSIS — D509 Iron deficiency anemia, unspecified: Secondary | ICD-10-CM

## 2018-07-23 DIAGNOSIS — C183 Malignant neoplasm of hepatic flexure: Secondary | ICD-10-CM | POA: Diagnosis not present

## 2018-07-23 DIAGNOSIS — K5900 Constipation, unspecified: Secondary | ICD-10-CM | POA: Insufficient documentation

## 2018-07-23 DIAGNOSIS — Z7189 Other specified counseling: Secondary | ICD-10-CM

## 2018-07-23 DIAGNOSIS — R200 Anesthesia of skin: Secondary | ICD-10-CM | POA: Diagnosis not present

## 2018-07-23 DIAGNOSIS — Z5111 Encounter for antineoplastic chemotherapy: Secondary | ICD-10-CM | POA: Diagnosis not present

## 2018-07-23 DIAGNOSIS — M810 Age-related osteoporosis without current pathological fracture: Secondary | ICD-10-CM | POA: Insufficient documentation

## 2018-07-23 DIAGNOSIS — C779 Secondary and unspecified malignant neoplasm of lymph node, unspecified: Secondary | ICD-10-CM | POA: Diagnosis not present

## 2018-07-23 DIAGNOSIS — E538 Deficiency of other specified B group vitamins: Secondary | ICD-10-CM | POA: Insufficient documentation

## 2018-07-23 NOTE — Progress Notes (Signed)
Met with Kathy [name] and her family before and during consult with Dr. Janese Banks. Introduced Therapist, nutritional and provided contact informration for future needs. She prefers to receive her care in [place]. Her port is scheduled for 10/10. Chemo class arranged and she will plan to start treatment 10/14.

## 2018-07-23 NOTE — Progress Notes (Signed)
New pt with colon cancer and she was told to come here for chemo. Her surgeon will put port in per pt.

## 2018-07-24 ENCOUNTER — Other Ambulatory Visit: Payer: Self-pay | Admitting: Oncology

## 2018-07-24 ENCOUNTER — Encounter
Admission: RE | Admit: 2018-07-24 | Discharge: 2018-07-24 | Disposition: A | Discharge status: Home / Self Care | Payer: Medicare Other | Source: Ambulatory Visit | Attending: Surgery | Admitting: Surgery

## 2018-07-24 DIAGNOSIS — C183 Malignant neoplasm of hepatic flexure: Secondary | ICD-10-CM

## 2018-07-24 DIAGNOSIS — Z7189 Other specified counseling: Secondary | ICD-10-CM | POA: Insufficient documentation

## 2018-07-24 MED ORDER — DEXAMETHASONE 4 MG PO TABS: 8.0000 mg | ORAL_TABLET | Freq: Every day | ORAL | 1 refills | Status: DC

## 2018-07-24 MED ORDER — LORAZEPAM 0.5 MG PO TABS: 0.5000 mg | ORAL_TABLET | Freq: Four times a day (QID) | ORAL | 0 refills | Status: DC | PRN

## 2018-07-24 MED ORDER — ONDANSETRON HCL 8 MG PO TABS: 8.0000 mg | ORAL_TABLET | Freq: Two times a day (BID) | ORAL | 1 refills | Status: DC | PRN

## 2018-07-24 MED ORDER — PROCHLORPERAZINE MALEATE 10 MG PO TABS: 10.0000 mg | ORAL_TABLET | Freq: Four times a day (QID) | ORAL | 1 refills | Status: DC | PRN

## 2018-07-24 MED ORDER — LIDOCAINE-PRILOCAINE 2.5-2.5 % EX CREA: TOPICAL_CREAM | CUTANEOUS | 3 refills | Status: DC

## 2018-07-24 NOTE — Progress Notes (Signed)
START ON PATHWAY REGIMEN - Colorectal     A cycle is every 14 days:     Oxaliplatin      Leucovorin      5-Fluorouracil      5-Fluorouracil   **Always confirm dose/schedule in your pharmacy ordering system**  Patient Characteristics: Colon Adjuvant, Stage III, High Risk (T4 or N2) Current evidence of distant metastases<= No AJCC T Category: T4a AJCC N Category: N1a AJCC M Category: M0 AJCC 8 Stage Grouping: IIIB Intent of Therapy: Curative Intent, Discussed with Patient

## 2018-07-24 NOTE — Progress Notes (Signed)
Hematology/Oncology Consult note [facility] Telephone:([phone] [phone]  Patient Care Team: Clarisse Gouge, MD as PCP - General (Family Medicine) Clent Jacks, RN as Registered Nurse   Name of the patient: Kathy [name]  [id number]  09/01/1946    Reason for referral- new diagnosis of colon cancer   Referring physician- Dr. Lysle Pearl  Date of visit: 07/24/18   History of presenting illness-patient is a 72 year old female with no significant medical history other than schizophrenia and osteoporosis.  She was recently found to have iron deficiency anemia which did not get better despite taking iron supplements.  She underwent colonoscopy by Dr. Alice Reichert in August 2019 which showed a partially obstructing mass in the distal ascending colon.  This was biopsied and was consistent with moderately differentiated adenocarcinoma.  Patient also had a CT abdomen and pelvis with contrast in August 2019 which showed a 3.2 cm constricting apple core type ascending colon cancer near the hepatic flexure.  Small pericolonic lymph nodes but no overt adenopathy.  3.5 mm left hepatic lobe lesion which was eventually characterized as hemangioma on MRI.  CT chest showed small subcentimeter pulmonary nodules which were nonspecific.  Patient underwent right hemicolectomy on 07/06/2018 which showed moderately differentiated adenocarcinoma with invasion through the visceral peritoneum.  Metastatic carcinoma in 1 of 44 regional lymph nodes.  Margins were negative.  No lymphovascular invasion or perineural invasion was identified.  No tumor deposits identified.  pT4a pN1a  MSI stable.  Patient is recovering well from her surgery.  She denies any complaints at this time.  Denies any abdominal pain.  Her bowel movements are regular and she denies any blood in her stools.  She is not taking any oral iron tablets presently.  She is here with her son and daughter-in-law to discuss further  management  ECOG PS- 1  Pain scale- 0   Review of systems- Review of Systems  Constitutional: Negative for chills, fever, malaise/fatigue and weight loss.  HENT: Negative for congestion, ear discharge and nosebleeds.   Eyes: Negative for blurred vision.  Respiratory: Negative for cough, hemoptysis, sputum production, shortness of breath and wheezing.   Cardiovascular: Negative for chest pain, palpitations, orthopnea and claudication.  Gastrointestinal: Negative for abdominal pain, blood in stool, constipation, diarrhea, heartburn, melena, nausea and vomiting.  Genitourinary: Negative for dysuria, flank pain, frequency, hematuria and urgency.  Musculoskeletal: Negative for back pain, joint pain and myalgias.  Skin: Negative for rash.  Neurological: Negative for dizziness, tingling, focal weakness, seizures, weakness and headaches.  Endo/Heme/Allergies: Does not bruise/bleed easily.  Psychiatric/Behavioral: Negative for depression and suicidal ideas. The patient does not have insomnia.     Allergies  Allergen Reactions  . Biaxin [Clarithromycin] Shortness Of Breath  . Levofloxacin     Muscle pain  . Penicillins Rash    Has patient had a PCN reaction causing immediate rash, facial/tongue/throat swelling, SOB or lightheadedness with hypotension: no Has patient had a PCN reaction causing severe rash involving mucus membranes or skin necrosis: no Has patient had a PCN reaction that required hospitalization: no Has patient had a PCN reaction occurring within the last 10 years: no If all of the above answers are "NO", then may proceed with Cephalosporin use.     Patient Active Problem List   Diagnosis Date Noted  . Colon cancer ([place]) 07/06/2018     Past Medical History:  Diagnosis Date  . Arthritis    hips  . B12 deficiency   . Colon cancer (  [place]) 07/06/2018  . 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 COPD (chronic obstructive pulmonary disease) ([place])   . Fibrocystic breast  disease   . Mediastinal mass    removed 1/16  . Motion sickness    fair rides  . Osteoporosis   . Schizophrenia ([place])   . Vitamin D deficiency   . Wears dentures    partial upper and lower     Past Surgical History:  Procedure Laterality Date  . BREAST BIOPSY Left    neg  . BREAST BIOPSY Right 05/04/13   [place] bx/clip-neg  . COLONOSCOPY    . COLONOSCOPY WITH PROPOFOL N/A 06/10/2018   Procedure: COLONOSCOPY WITH PROPOFOL;  Surgeon: Toledo, [name], MD;  Location: [facility];  Service: Gastroenterology;  Laterality: N/A;  . ESOPHAGOGASTRODUODENOSCOPY N/A 06/10/2018   Procedure: ESOPHAGOGASTRODUODENOSCOPY (EGD);  Surgeon: Toledo, [name], MD;  Location: [facility];  Service: Gastroenterology;  Laterality: N/A;  . HALLUX VALGUS AKIN Right 11/20/2016   Procedure: HALLUX VALGUS AKIN;  Surgeon: Samara Deist, DPM;  Location: [place];  Service: Podiatry;  Laterality: Right;  . HALLUX VALGUS AUSTIN Right 11/20/2016   Procedure: HALLUX VALGUS AUSTIN  Alroy Dust) right;  Surgeon: Samara Deist, DPM;  Location: [place];  Service: Podiatry;  Laterality: Right;  IVA with Popliteal  . HALLUX VALGUS AUSTIN Left 01/15/2017   Procedure: HALLUX VALGUS AUSTIN  Correction left foot  Iva Popiteal;  Surgeon: Samara Deist, DPM;  Location: [place];  Service: Podiatry;  Laterality: Left;  IVA Popliteal   . LAPAROSCOPIC RIGHT COLECTOMY N/A 07/06/2018   Procedure: LAPAROSCOPIC COLECTOMY;  Surgeon: Benjamine Sprague, DO;  Location: [facility];  Service: General;  Laterality: N/A;  . MEDIASTINAL MASS EXCISION Left 11/09/2014   Dr. Wynelle Cleveland, [place]  . THORACOSCOPY Left 11/09/2014   with excision mediastinal mass  . TUBAL LIGATION      Social History   Socioeconomic History  . Marital status: Widowed    Spouse name: Not on file  . Number of children: Not on file  . Years of education: Not on file  . Highest education level: Not on file  Occupational History  . Not on file    Social Needs  . Financial resource strain: Not on file  . Food insecurity:    Worry: Not on file    Inability: Not on file  . Transportation needs:    Medical: Not on file    Non-medical: Not on file  Tobacco Use  . Smoking status: Former Smoker    Last attempt to quit: 10/18/2014    Years since quitting: 3.7  . Smokeless tobacco: Never Used  Substance and Sexual Activity  . Alcohol use: No  . Drug use: Never  . Sexual activity: Not on file  Lifestyle  . Physical activity:    Days per week: Not on file    Minutes per session: Not on file  . Stress: Not on file  Relationships  . Social connections:    Talks on phone: Not on file    Gets together: Not on file    Attends religious service: Not on file    Active member of club or organization: Not on file    Attends meetings of clubs or organizations: Not on file    Relationship status: Not on file  . Intimate partner violence:    Fear of current or ex partner: Not on file    Emotionally abused: Not on file    Physically abused:  Not on file    Forced sexual activity: Not on file  Other Topics Concern  . Not on file  Social History Narrative  . Not on file     Family History  Problem Relation Age of Onset  . Cancer Father   . Breast cancer Neg Hx      Current Outpatient Medications:  .  Cholecalciferol (VITAMIN D3) 2000 units TABS, Take 2,000 Units by mouth daily. , Disp: , Rfl:  .  conjugated estrogens (PREMARIN) vaginal cream, Place 1 Applicatorful vaginally 2 (two) times a week. , Disp: , Rfl:  .  PROCYCLIDINE HCL PO, Take 5 mg by mouth 3 (three) times daily. (Kemadrine) , Disp: , Rfl:  .  thiothixene (NAVANE) 5 MG capsule, Take 10 mg by mouth daily. , Disp: , Rfl:  .  vitamin B-12 (CYANOCOBALAMIN) 1000 MCG tablet, Take 1,000 mcg by mouth daily. , Disp: , Rfl:  .  ASPIRIN 81 PO, Take 81 mg by mouth daily. , Disp: , Rfl:  .  ibuprofen (ADVIL,MOTRIN) 800 MG tablet, Take 1 tablet (800 mg total) by mouth every 8  (eight) hours as needed for mild pain or moderate pain. (Patient not taking: Reported on 07/20/2018), Disp: 30 tablet, Rfl: 0 .  vitamin C (ASCORBIC ACID) 500 MG tablet, Take 500 mg by mouth 3 (three) times daily., Disp: , Rfl:    Physical exam:  Vitals:   07/23/18 1453  BP: 114/75  Pulse: 89  Resp: 18  Temp: 98.2 F (36.8 C)  TempSrc: Oral  Weight: 157 lb 1.6 oz (71.3 kg)  Height: _0  (1.626 m)   Physical Exam  Constitutional: She is oriented to person, place, and time. She appears well-developed and well-nourished.  HENT:  Head: Normocephalic and atraumatic.  Eyes: Pupils are equal, round, and reactive to light. EOM are normal.  Neck: Normal range of motion.  Cardiovascular: Normal rate, regular rhythm and normal heart sounds.  Pulmonary/Chest: Effort normal and breath sounds normal.  Abdominal: Soft. Bowel sounds are normal.  Laparoscopy scar is healing well.  Neurological: She is alert and oriented to person, place, and time.  Skin: Skin is warm and dry.       CMP Latest Ref Rng & Units 07/07/2018  Glucose 70 - 99 mg/dL 129(H)  BUN 8 - 23 mg/dL 10  Creatinine 0.44 - 1.00 mg/dL 0.60  Sodium 135 - 145 mmol/L 140  Potassium 3.5 - 5.1 mmol/L 3.8  Chloride 98 - 111 mmol/L 108  CO2 22 - 32 mmol/L 23  Calcium 8.9 - 10.3 mg/dL 8.8(L)   CBC Latest Ref Rng & Units 07/07/2018  WBC 3.6 - 11.0 K/uL 14.8(H)  Hemoglobin 12.0 - 16.0 g/dL 11.9(L)  Hematocrit 35.0 - 47.0 % 35.9  Platelets 150 - 440 K/uL 260     Assessment and plan- Patient is a 72 y.o. 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  I have also reviewed final pathology results and discussed findings with patient and her family.  Patient was found to have stage IIIb adenocarcinoma of the colon.  Given that she has T4 disease she would not  be a candidate for 3 months of Shaw ox chemotherapy.  Given her age and frailty I do not think that she would tolerate capecitabine for adjuvant treatment given the side effects of skin rash diarrhea and  nausea associated with it.  She had node positive disease making this stage III and would therefore benefit from adjuvant chemotherapy.  I would recommend 12 cycles of FOLFOX chemotherapy given IV every 2 weeks for a total period of 6 months.  She will come on day 3 of chemotherapy for pump disconnect.  Depending on how her counts do I may consider giving her Neulasta down the line if she has significant chemo associated neutropenia.  Mosaic trial for adjuvant colon cancer showed about 30% reduction in risk of recurrence in about 20% decrease in risk of death with adjuvant chemotherapy or observation  Discussed risks and benefits of chemotherapy including all but not limited to nausea, vomiting, low blood counts, fatigue, risk of infections and hospitalization.  Risk of peripheral neuropathy associated with oxaliplatin.  Treatment will be given with a curative intent.  Patient understands and agrees to proceed as planned.  She will be getting port placement by Dr. Lysle Pearl next week and we will plan for chemotherapy class next week as well.  Patient is 12 and somewhat frail.  I will dose reduce her oxaliplatin to 65 mg/m and monitor for symptoms of peripheral neuropathy closely.  If she were to develop any significant neuropathy I will have a low threshold to stop oxaliplatin at that time.  Patient does have small subcentimeter lung nodules which are too small to characterize or biopsy.  I will be getting CT chest along with CT abdomen for surveillance imaging in the future.  I will see her back in 2 weeks time in [place] per patient preference with CBC CMP as well as ferritin and iron studies.  Preoperative CEA was not checked and I will get a CEA at this time   Total face to face encounter time for this  patient visit was 45 min. >50% of the time was  spent in counseling and coordination of care.     Cancer Staging Colon cancer [facility]) Staging form: Colon and Rectum, AJCC 8th Edition - Pathologic stage from 07/23/2018: Stage IIIB (pT4a, pN1a, cM0) - Signed by Sindy Guadeloupe, MD on 07/24/2018     Thank you for this kind referral and the opportunity to participate in the care of this patient   Visit Diagnosis 1. Malignant neoplasm of hepatic flexure (HCC)   2. Goals of care, counseling/discussion     Dr. Randa Evens, MD, MPH [facility] [phone] 07/24/2018 12:17 PM

## 2018-07-24 NOTE — Patient Instructions (Addendum)
Your procedure is scheduled on: 07-30-18 Report to Same Day Surgery 2nd floor medical mall [facility] Entrance-take elevator on left to 2nd floor.  Check in with surgery information desk.) To find out your arrival time please call [phone] between 1PM - 3PM on 07-29-18  Remember: Instructions that are not followed completely may result in serious medical risk, up to and including death, or upon the discretion of your surgeon and anesthesiologist your surgery may need to be rescheduled.    _x___ 1. Do not eat food after midnight the night before your procedure. You may drink clear liquids up to 2 hours before you are scheduled to arrive at the hospital for your procedure.  Do not drink clear liquids within 2 hours of your scheduled arrival to the hospital.  Clear liquids include  --Water or Apple juice without pulp  --Clear carbohydrate beverage such as ClearFast or Gatorade  --Black Coffee or Clear Tea (No milk, no creamers, do not add anything to the coffee or Tea   ____Ensure clear carbohydrate drink on the way to the hospital for bariatric patients  ____Ensure clear carbohydrate drink 3 hours before surgery for Dr Dwyane Luo patients if physician instructed.   No gum chewing or hard candies.     __x__ 2. No Alcohol for 24 hours before or after surgery.   __x__3. No Smoking or e-cigarettes for 24 prior to surgery.  Do not use any chewable tobacco products for at least 6 hour prior to surgery   ____  4. Bring all medications with you on the day of surgery if instructed.    __x__ 5. Notify your doctor if there is any change in your medical condition     (cold, fever, infections).    x___6. On the morning of surgery brush your teeth with toothpaste and water.  You may rinse your mouth with mouth wash if you wish.  Do not swallow any toothpaste or mouthwash.   Do not wear jewelry, make-up, hairpins, clips or nail polish.  Do not wear lotions, powders, or perfumes. You may wear  deodorant.  Do not shave 48 hours prior to surgery. Men may shave face and neck.  Do not bring valuables to the hospital.    [facility] is not responsible for any belongings or valuables.               Contacts, dentures or bridgework may not be worn into surgery.  Leave your suitcase in the car. After surgery it may be brought to your room.  For patients admitted to the hospital, discharge time is determined by your treatment team.  _  Patients discharged the day of surgery will not be allowed to drive home.  You will need someone to drive you home and stay with you the night of your procedure.    Please read over the following fact sheets that you were given:   [facility] Preparing for Surgery   _x___ TAKE THE FOLLOWING MEDICATION THE MORNING OF SURGERY WITH A SMALL SIP OF WATER. These include:  1. KEMADRINE  2.  3.  4.  5.  6.  ____Fleets enema or Magnesium Citrate as directed.   ____ Use CHG Soap or sage wipes as directed on instruction sheet   ____ Use inhalers on the day of surgery and bring to hospital day of surgery  ____ Stop Metformin and Janumet 2 days prior to surgery.    ____ Take 1/2 of usual insulin dose the night before surgery  and none on the morning surgery.   _X___ Follow recommendations from Cardiologist, Pulmonologist or PCP regarding stopping Aspirin, Coumadin, Plavix ,Eliquis, Effient, or Pradaxa, and Pletal-PT HAS ALREADY STOPPED 81 MG ASA  X____Stop Anti-inflammatories such as Advil, Aleve, Ibuprofen, Motrin, Naproxen, Naprosyn, Goodies powders or aspirin products NOW- OK to take Tylenol    _x___ Stop supplements until after surgery-PT HAS ALREADY STOPPED VIT C   ____ Bring C-Pap to the hospital.

## 2018-07-27 ENCOUNTER — Inpatient Hospital Stay: Payer: Medicare Other | Admitting: Oncology

## 2018-07-27 ENCOUNTER — Inpatient Hospital Stay: Payer: Medicare Other

## 2018-07-27 NOTE — Patient Instructions (Signed)

## 2018-07-28 ENCOUNTER — Encounter
Admission: RE | Admit: 2018-07-28 | Discharge: 2018-07-28 | Disposition: A | Discharge status: Home / Self Care | Payer: Medicare Other | Source: Ambulatory Visit | Attending: Surgery | Admitting: Surgery

## 2018-07-28 DIAGNOSIS — Z01818 Encounter for other preprocedural examination: Secondary | ICD-10-CM | POA: Insufficient documentation

## 2018-07-28 DIAGNOSIS — Z881 Allergy status to other antibiotic agents status: Secondary | ICD-10-CM | POA: Diagnosis not present

## 2018-07-28 DIAGNOSIS — C183 Malignant neoplasm of hepatic flexure: Secondary | ICD-10-CM

## 2018-07-28 DIAGNOSIS — Z888 Allergy status to other drugs, medicaments and biological substances status: Secondary | ICD-10-CM | POA: Diagnosis not present

## 2018-07-28 DIAGNOSIS — C182 Malignant neoplasm of ascending colon: Secondary | ICD-10-CM

## 2018-07-28 DIAGNOSIS — M810 Age-related osteoporosis without current pathological fracture: Secondary | ICD-10-CM | POA: Diagnosis not present

## 2018-07-28 DIAGNOSIS — E538 Deficiency of other specified B group vitamins: Secondary | ICD-10-CM | POA: Diagnosis not present

## 2018-07-28 DIAGNOSIS — Z880 Allergy status to penicillin: Secondary | ICD-10-CM | POA: Diagnosis not present

## 2018-07-28 DIAGNOSIS — Z79899 Other long term (current) drug therapy: Secondary | ICD-10-CM | POA: Diagnosis not present

## 2018-07-28 DIAGNOSIS — Z87891 Personal history of nicotine dependence: Secondary | ICD-10-CM | POA: Diagnosis not present

## 2018-07-28 DIAGNOSIS — J449 Chronic obstructive pulmonary disease, unspecified: Secondary | ICD-10-CM | POA: Diagnosis not present

## 2018-07-28 DIAGNOSIS — M1990 Unspecified osteoarthritis, unspecified site: Secondary | ICD-10-CM | POA: Diagnosis not present

## 2018-07-28 DIAGNOSIS — F209 Schizophrenia, unspecified: Secondary | ICD-10-CM | POA: Diagnosis not present

## 2018-07-28 DIAGNOSIS — Z7982 Long term (current) use of aspirin: Secondary | ICD-10-CM | POA: Diagnosis not present

## 2018-07-28 LAB — CBC WITH DIFFERENTIAL/PLATELET
ABS IMMATURE GRANULOCYTES: 0.03 10*3/uL (ref 0.00–0.07)
Basophils Absolute: 0.1 10*3/uL (ref 0.0–0.1)
Basophils Relative: 1 %
Eosinophils Absolute: 0.5 10*3/uL (ref 0.0–0.5)
Eosinophils Relative: 6 %
HCT: 39.6 % (ref 36.0–46.0)
HEMOGLOBIN: 12.3 g/dL (ref 12.0–15.0)
IMMATURE GRANULOCYTES: 0 %
LYMPHS PCT: 20 %
Lymphs Abs: 1.7 10*3/uL (ref 0.7–4.0)
MCH: 28 pg (ref 26.0–34.0)
MCHC: 31.1 g/dL (ref 30.0–36.0)
MCV: 90 fL (ref 80.0–100.0)
MONOS PCT: 11 %
Monocytes Absolute: 0.9 10*3/uL (ref 0.1–1.0)
NEUTROS ABS: 5.3 10*3/uL (ref 1.7–7.7)
NEUTROS PCT: 62 %
PLATELETS: 404 10*3/uL — AB (ref 150–400)
RBC: 4.4 MIL/uL (ref 3.87–5.11)
RDW: 15.7 % — ABNORMAL HIGH (ref 11.5–15.5)
WBC: 8.5 10*3/uL (ref 4.0–10.5)
nRBC: 0 % (ref 0.0–0.2)

## 2018-07-28 LAB — PROTIME-INR
INR: 0.88
PROTHROMBIN TIME: 11.9 s (ref 11.4–15.2)

## 2018-07-29 MED ORDER — CLINDAMYCIN PHOSPHATE 900 MG/50ML IV SOLN
900.0000 mg | INTRAVENOUS | Status: AC
  Administered 2018-07-30: 900 mg via INTRAVENOUS

## 2018-07-30 ENCOUNTER — Ambulatory Visit: Payer: Medicare Other | Admitting: Registered Nurse

## 2018-07-30 ENCOUNTER — Other Ambulatory Visit: Payer: Self-pay

## 2018-07-30 ENCOUNTER — Ambulatory Visit: Payer: Medicare Other

## 2018-07-30 ENCOUNTER — Encounter
Admission: RE | Disposition: A | Discharge status: Home / Self Care | Payer: Self-pay | Source: Ambulatory Visit | Attending: Surgery

## 2018-07-30 ENCOUNTER — Telehealth: Payer: Self-pay | Admitting: *Deleted

## 2018-07-30 ENCOUNTER — Ambulatory Visit
Admission: RE | Admit: 2018-07-30 | Discharge: 2018-07-30 | Disposition: A | Discharge status: Home / Self Care | Payer: Medicare Other | Source: Ambulatory Visit | Attending: Surgery | Admitting: Surgery

## 2018-07-30 DIAGNOSIS — J449 Chronic obstructive pulmonary disease, unspecified: Secondary | ICD-10-CM | POA: Insufficient documentation

## 2018-07-30 DIAGNOSIS — F209 Schizophrenia, unspecified: Secondary | ICD-10-CM | POA: Insufficient documentation

## 2018-07-30 DIAGNOSIS — M810 Age-related osteoporosis without current pathological fracture: Secondary | ICD-10-CM | POA: Insufficient documentation

## 2018-07-30 DIAGNOSIS — E538 Deficiency of other specified B group vitamins: Secondary | ICD-10-CM | POA: Insufficient documentation

## 2018-07-30 DIAGNOSIS — M1990 Unspecified osteoarthritis, unspecified site: Secondary | ICD-10-CM | POA: Insufficient documentation

## 2018-07-30 DIAGNOSIS — C182 Malignant neoplasm of ascending colon: Secondary | ICD-10-CM | POA: Diagnosis not present

## 2018-07-30 DIAGNOSIS — Z87891 Personal history of nicotine dependence: Secondary | ICD-10-CM | POA: Insufficient documentation

## 2018-07-30 DIAGNOSIS — Z880 Allergy status to penicillin: Secondary | ICD-10-CM | POA: Insufficient documentation

## 2018-07-30 DIAGNOSIS — Z95828 Presence of other vascular implants and grafts: Secondary | ICD-10-CM

## 2018-07-30 DIAGNOSIS — Z79899 Other long term (current) drug therapy: Secondary | ICD-10-CM | POA: Insufficient documentation

## 2018-07-30 DIAGNOSIS — Z888 Allergy status to other drugs, medicaments and biological substances status: Secondary | ICD-10-CM | POA: Insufficient documentation

## 2018-07-30 DIAGNOSIS — Z881 Allergy status to other antibiotic agents status: Secondary | ICD-10-CM | POA: Insufficient documentation

## 2018-07-30 DIAGNOSIS — Z7982 Long term (current) use of aspirin: Secondary | ICD-10-CM | POA: Insufficient documentation

## 2018-07-30 HISTORY — PX: PORTACATH PLACEMENT: SHX2246

## 2018-07-30 SURGERY — INSERTION, TUNNELED CENTRAL VENOUS DEVICE, WITH PORT
Anesthesia: Choice | Laterality: Right

## 2018-07-30 MED ORDER — HEPARIN SOD (PORK) LOCK FLUSH 100 UNIT/ML IV SOLN
INTRAVENOUS | Status: DC | PRN
  Administered 2018-07-30: 500 [IU]

## 2018-07-30 MED ORDER — LIDOCAINE HCL (PF) 2 % IJ SOLN
INTRAMUSCULAR | Status: AC
  Filled 2018-07-30: qty 10

## 2018-07-30 MED ORDER — DOCUSATE SODIUM 100 MG PO CAPS: 100.0000 mg | ORAL_CAPSULE | Freq: Two times a day (BID) | ORAL | 0 refills | Status: AC | PRN

## 2018-07-30 MED ORDER — HYDROCODONE-ACETAMINOPHEN 5-325 MG PO TABS: 1.0000 | ORAL_TABLET | Freq: Four times a day (QID) | ORAL | 0 refills | Status: AC | PRN

## 2018-07-30 MED ORDER — FENTANYL CITRATE (PF) 100 MCG/2ML IJ SOLN
INTRAMUSCULAR | Status: AC
  Filled 2018-07-30: qty 2

## 2018-07-30 MED ORDER — FAMOTIDINE 20 MG PO TABS
ORAL_TABLET | ORAL | Status: AC
  Administered 2018-07-30: 20 mg via ORAL
  Filled 2018-07-30: qty 1

## 2018-07-30 MED ORDER — MIDAZOLAM HCL 2 MG/2ML IJ SOLN
INTRAMUSCULAR | Status: DC | PRN
  Administered 2018-07-30: 1 mg via INTRAVENOUS

## 2018-07-30 MED ORDER — PROPOFOL 10 MG/ML IV BOLUS
INTRAVENOUS | Status: DC | PRN
  Administered 2018-07-30: 150 mg via INTRAVENOUS

## 2018-07-30 MED ORDER — CHLORHEXIDINE GLUCONATE CLOTH 2 % EX PADS: 6.0000 | MEDICATED_PAD | Freq: Once | CUTANEOUS | Status: DC

## 2018-07-30 MED ORDER — LIDOCAINE HCL 1 % IJ SOLN
INTRAMUSCULAR | Status: DC | PRN
  Administered 2018-07-30: 4 mL via INTRAMUSCULAR

## 2018-07-30 MED ORDER — ONDANSETRON HCL 4 MG/2ML IJ SOLN
INTRAMUSCULAR | Status: DC | PRN
  Administered 2018-07-30: 4 mg via INTRAVENOUS

## 2018-07-30 MED ORDER — PHENYLEPHRINE HCL 10 MG/ML IJ SOLN
INTRAMUSCULAR | Status: DC | PRN
  Administered 2018-07-30: 100 ug via INTRAVENOUS
  Administered 2018-07-30: 75 ug via INTRAVENOUS
  Administered 2018-07-30: 100 ug via INTRAVENOUS

## 2018-07-30 MED ORDER — ACETAMINOPHEN 325 MG PO TABS: 650.0000 mg | ORAL_TABLET | Freq: Three times a day (TID) | ORAL | 0 refills | Status: AC | PRN

## 2018-07-30 MED ORDER — LACTATED RINGERS IV SOLN
INTRAVENOUS | Status: DC
  Administered 2018-07-30: 09:00:00 via INTRAVENOUS

## 2018-07-30 MED ORDER — FENTANYL CITRATE (PF) 100 MCG/2ML IJ SOLN: 25.0000 ug | INTRAMUSCULAR | Status: DC | PRN

## 2018-07-30 MED ORDER — CLINDAMYCIN PHOSPHATE 900 MG/50ML IV SOLN
INTRAVENOUS | Status: AC
  Filled 2018-07-30: qty 50

## 2018-07-30 MED ORDER — SODIUM CHLORIDE 0.9 % IJ SOLN
INTRAMUSCULAR | Status: AC
  Filled 2018-07-30: qty 50

## 2018-07-30 MED ORDER — DEXAMETHASONE SODIUM PHOSPHATE 10 MG/ML IJ SOLN
INTRAMUSCULAR | Status: DC | PRN
  Administered 2018-07-30: 4 mg via INTRAVENOUS

## 2018-07-30 MED ORDER — HEPARIN SOD (PORK) LOCK FLUSH 100 UNIT/ML IV SOLN
INTRAVENOUS | Status: AC
  Filled 2018-07-30: qty 5

## 2018-07-30 MED ORDER — MIDAZOLAM HCL 2 MG/2ML IJ SOLN
INTRAMUSCULAR | Status: AC
  Filled 2018-07-30: qty 2

## 2018-07-30 MED ORDER — LIDOCAINE HCL (CARDIAC) PF 100 MG/5ML IV SOSY
PREFILLED_SYRINGE | INTRAVENOUS | Status: DC | PRN
  Administered 2018-07-30: 80 mg via INTRAVENOUS

## 2018-07-30 MED ORDER — IBUPROFEN 800 MG PO TABS: 800.0000 mg | ORAL_TABLET | Freq: Three times a day (TID) | ORAL | 0 refills | Status: DC | PRN

## 2018-07-30 MED ORDER — LIDOCAINE HCL (PF) 1 % IJ SOLN
INTRAMUSCULAR | Status: AC
  Filled 2018-07-30: qty 30

## 2018-07-30 MED ORDER — PROPOFOL 500 MG/50ML IV EMUL
INTRAVENOUS | Status: AC
  Filled 2018-07-30: qty 50

## 2018-07-30 MED ORDER — ONDANSETRON HCL 4 MG/2ML IJ SOLN: 4.0000 mg | Freq: Once | INTRAMUSCULAR | Status: DC | PRN

## 2018-07-30 MED ORDER — HEPARIN SODIUM (PORCINE) 5000 UNIT/ML IJ SOLN
INTRAMUSCULAR | Status: AC
  Filled 2018-07-30: qty 1

## 2018-07-30 MED ORDER — FENTANYL CITRATE (PF) 100 MCG/2ML IJ SOLN
INTRAMUSCULAR | Status: DC | PRN
  Administered 2018-07-30 (×4): 25 ug via INTRAVENOUS

## 2018-07-30 MED ORDER — BUPIVACAINE HCL (PF) 0.5 % IJ SOLN
INTRAMUSCULAR | Status: AC
  Filled 2018-07-30: qty 30

## 2018-07-30 MED ORDER — FAMOTIDINE 20 MG PO TABS
20.0000 mg | ORAL_TABLET | Freq: Once | ORAL | Status: AC
  Administered 2018-07-30: 20 mg via ORAL

## 2018-07-30 SURGICAL SUPPLY — 27 items
BLADE SURG 11 STRL SS SAFETY (MISCELLANEOUS) ×3 IMPLANT
CANISTER SUCT 1200ML W/VALVE (MISCELLANEOUS) ×3 IMPLANT
CHLORAPREP W/TINT 26ML (MISCELLANEOUS) ×3 IMPLANT
COVER LIGHT HANDLE STERIS (MISCELLANEOUS) ×6 IMPLANT
COVER WAND RF STERILE (DRAPES) ×3 IMPLANT
DERMABOND ADVANCED (GAUZE/BANDAGES/DRESSINGS) ×2
DERMABOND ADVANCED .7 DNX12 (GAUZE/BANDAGES/DRESSINGS) ×1 IMPLANT
DRAPE C-ARM XRAY 36X54 (DRAPES) ×3 IMPLANT
ELECT REM PT RETURN 9FT ADLT (ELECTROSURGICAL) ×3
ELECTRODE REM PT RTRN 9FT ADLT (ELECTROSURGICAL) ×1 IMPLANT
GAUZE 4X4 16PLY RFD (DISPOSABLE) ×3 IMPLANT
GLOVE SURG SYN 7.0 (GLOVE) ×15 IMPLANT
GOWN STRL REUS W/ TWL LRG LVL3 (GOWN DISPOSABLE) ×3 IMPLANT
GOWN STRL REUS W/TWL LRG LVL3 (GOWN DISPOSABLE) ×6
KIT PORT POWER 8FR ISP CVUE (Port) ×3 IMPLANT
KIT TURNOVER KIT A (KITS) ×3 IMPLANT
LABEL OR SOLS (LABEL) ×3 IMPLANT
NEEDLE FILTER BLUNT 18X 1/2SAF (NEEDLE) ×2
NEEDLE FILTER BLUNT 18X1 1/2 (NEEDLE) ×1 IMPLANT
PACK PORT-A-CATH (MISCELLANEOUS) ×3 IMPLANT
SUT MNCRL AB 4-0 PS2 18 (SUTURE) ×3 IMPLANT
SUT VIC AB 2-0 SH 27 (SUTURE) ×2
SUT VIC AB 2-0 SH 27XBRD (SUTURE) ×1 IMPLANT
SUT VIC AB 3-0 SH 27 (SUTURE) ×2
SUT VIC AB 3-0 SH 27X BRD (SUTURE) ×1 IMPLANT
SYR 10ML LL (SYRINGE) ×3 IMPLANT
SYR 3ML LL SCALE MARK (SYRINGE) ×3 IMPLANT

## 2018-07-30 NOTE — Anesthesia Preprocedure Evaluation (Addendum)
Anesthesia Evaluation  Patient identified by MRN, date of birth, ID band Patient awake    Reviewed: Allergy & Precautions, H&P , NPO status , Patient's Chart, lab work & pertinent test results  History of Anesthesia Complications Negative for: history of anesthetic complications  Airway Mallampati: III  TM Distance: <3 FB Neck ROM: limited    Dental  (+) Chipped, Poor Dentition, Missing, Partial Upper, Partial Lower   Pulmonary neg shortness of breath, COPD, former smoker,           Cardiovascular Exercise Tolerance: Good (-) angina(-) Past MI and (-) DOE negative cardio ROS       Neuro/Psych PSYCHIATRIC DISORDERS negative neurological ROS     GI/Hepatic negative GI ROS, Neg liver ROS,   Endo/Other  negative endocrine ROS  Renal/GU      Musculoskeletal  (+) Arthritis ,   Abdominal   Peds  Hematology negative hematology ROS (+)   Anesthesia Other Findings Past Medical History: No date: Arthritis     Comment:  hips No date: B12 deficiency No date: Constipation due to slow transit No date: COPD (chronic obstructive pulmonary disease) (HCC) No date: Fibrocystic breast disease No date: Mediastinal mass     Comment:  removed 1/16 No date: Motion sickness     Comment:  fair rides No date: Osteoporosis No date: Schizophrenia ([place]) No date: Vitamin D deficiency No date: Wears dentures     Comment:  partial upper and lower  Past Surgical History: No date: BREAST BIOPSY; Left     Comment:  neg 05/04/13: BREAST BIOPSY; Right     Comment:  [place] bx/clip-neg No date: COLONOSCOPY 06/10/2018: COLONOSCOPY WITH PROPOFOL; N/A     Comment:  Procedure: COLONOSCOPY WITH PROPOFOL;  Surgeon: Toledo,               [name], MD;  Location: [facility];  Service:               Gastroenterology;  Laterality: N/A; 06/10/2018: ESOPHAGOGASTRODUODENOSCOPY; N/A     Comment:  Procedure: ESOPHAGOGASTRODUODENOSCOPY (EGD);  Surgeon:                Toledo, [name], MD;  Location: [facility];                Service: Gastroenterology;  Laterality: N/A; 11/20/2016: HALLUX VALGUS AKIN; Right     Comment:  Procedure: HALLUX VALGUS AKIN;  Surgeon: Samara Deist,               DPM;  Location: [place];  Service: Podiatry;               Laterality: Right; 11/20/2016: [place]; Right     Comment:  Procedure: HALLUX VALGUS AUSTIN  Alroy Dust) right;                Surgeon: Samara Deist, DPM;  Location: [place];  Service: Podiatry;  Laterality: Right;  IVA with               Popliteal 01/15/2017: HALLUX VALGUS AUSTIN; Left     Comment:  Procedure: HALLUX VALGUS AUSTIN  Correction left foot                Iva Popiteal;  Surgeon: Samara Deist, DPM;  Location:               [place];  Service: Podiatry;  Laterality:               Left;  IVA Popliteal  11/09/2014: MEDIASTINAL MASS EXCISION; Left     Comment:  Dr. Wynelle Cleveland, [place] 11/09/2014: THORACOSCOPY; Left     Comment:  with excision mediastinal mass No date: TUBAL LIGATION  BMI    Body Mass Index:  26.60 kg/m      Reproductive/Obstetrics negative OB ROS                            Anesthesia Physical  Anesthesia Plan  ASA: III  Anesthesia Plan: General   Post-op Pain Management:    Induction: Intravenous  PONV Risk Score and Plan: Ondansetron, Dexamethasone, Midazolam and Treatment may vary due to age or medical condition  Airway Management Planned: LMA  Additional Equipment:   Intra-op Plan:   Post-operative Plan: Extubation in OR  Informed Consent: I have reviewed the patients History and Physical, chart, labs and discussed the procedure including the risks, benefits and alternatives for the proposed anesthesia with the patient or authorized representative who has indicated his/her understanding and acceptance.   Dental Advisory Given  Plan Discussed with:  Anesthesiologist, CRNA and Surgeon  Anesthesia Plan Comments: (Patient consented for risks of anesthesia including but not limited to:  - adverse reactions to medications - damage to teeth, lips or other oral mucosa - sore throat or hoarseness - Damage to heart, brain, lungs or loss of life  Patient voiced understanding.)       Anesthesia Quick Evaluation

## 2018-07-30 NOTE — Interval H&P Note (Signed)
History and Physical Interval Note:  07/30/2018 8:52 AM  Kathy [name]  has presented today for surgery, with the diagnosis of COLON CANCER  The various methods of treatment have been discussed with the patient and family. After consideration of risks, benefits and other options for treatment, the patient has consented to  Procedure(s): INSERTION PORT-A-CATH (N/A) as a surgical intervention .  The patient's history has been reviewed, patient examined, no change in status, stable for surgery.  We discussed risks/benefits/alternatives to the procedure.  I have reviewed the patient's chart and labs.  Questions were answered to the patient's satisfaction.     Kathy [name] Lysle Pearl

## 2018-07-30 NOTE — Telephone Encounter (Signed)
Called son to let him know that we got in touch with the insurance and prior authorization was required.  The insurance has called me back later today and let me know that all 3 meds were approved for his mother and he can go to the pharmacy and pick it up.  The [employer] representative told me that they have already contacted the pharmacy and let them know that it is okay and it has been approved.  The son [name] says that he will pick it up tomorrow

## 2018-07-30 NOTE — Anesthesia Procedure Notes (Signed)
Procedure Name: LMA Insertion Date/Time: 07/30/2018 9:07 AM Performed by: Geraldine Contras, CRNA Pre-anesthesia Checklist: Patient identified, Emergency Drugs available, Suction available, Patient being monitored and Timeout performed Patient Re-evaluated:Patient Re-evaluated prior to induction Oxygen Delivery Method: Circle system utilized Preoxygenation: Pre-oxygenation with 100% oxygen Induction Type: IV induction LMA: LMA inserted and LMA flexible inserted LMA Size: 4.0 Number of attempts: 1 Placement Confirmation: positive ETCO2 and breath sounds checked- equal and bilateral Dental Injury: Teeth and Oropharynx as per pre-operative assessment

## 2018-07-30 NOTE — Discharge Instructions (Signed)

## 2018-07-30 NOTE — Anesthesia Post-op Follow-up Note (Signed)
Anesthesia QCDR form completed.        

## 2018-07-30 NOTE — H&P (Signed)
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 female who is here for followup from above.  No complaints.     Current Medications: has a current medication list which includes the following prescription(s): acetaminophen, conjugated estrogens, procyclidine hcl, thiothixene, albuterol, docusate, ferrous gluconate, ferrous sulfate, neomycin, peg-electrolyte, shingrix (pf), and tiotropium bromide, and the following Facility-Administered Medications: cyanocobalamin.  Allergies:      Allergies  Allergen Reactions  . Biaxin [Clarithromycin] Shortness Of Breath  . Levofloxacin Muscle Pain  . Penicillins Rash    ROS: A 15 point review of systems was performed and pertinent positives and negatives noted in HPI   Objective:   BP 109/66   Pulse 96   Temp 36.4 C (97.6 F) (Oral)   Ht 162.5 cm (5' 3.98")   Wt 72.4 kg (159 lb 9.8 oz)   LMP  (LMP Unknown)   BMI 27.41 kg/m   Constitutional :  alert, appears stated age, cooperative and no distress  Gastrointestinal: soft, non-tender; bowel sounds normal; no masses,  no organomegaly.    Musculoskeletal: Steady gait and movement  Skin: Cool and moist, incision clean, dry, intact.  No erythema, induration or drainage to indicate infection.  Staples intact  Psychiatric: Normal affect, non-agitated, not confused       LABS:  SURGICAL PATHOLOGY Surgical Pathology * THIS IS AN ADDENDUM REPORT * CASE: [id number] PATIENT: Kathy [name] Surgical Pathology Report *Addendum R Reason for Addendum #1:DNA Mismatch Repair IHC Results  SPECIMEN SUBMITTED: A. Colon, right; resection  CLINICAL HISTORY: None provided  PRE-OPERATIVE DIAGNOSIS: Malignant neoplasm of ascending colon  POST-OPERATIVE DIAGNOSIS: Same as pre-op     DIAGNOSIS: A. TERMINAL ILEUM, RIGHT COLON, AND APPENDIX; RIGHT COLECTOMY: - ADENOCARCINOMA WITH INVASION THROUGH THE VISCERAL  PERITONEUM. - METASTATIC CARCINOMA IN 1 OF 44 REGIONAL LYMPH NODES (1/44). - APPENDIX WITH INCIDENTAL HYPERPLASTIC POLYP AT THE TIP.  CANCER CASE SUMMARY: COLON AND RECTUM Procedure: Right hemicolectomy Tumor Site: Distal ascending colon/hepatic flexure Tumor Size: Greatest dimension: 3.6 cm Macroscopic Tumor Perforation: Not identified Histologic Type: Adenocarcinoma Histologic Grade: G2, moderately differentiated Tumor Extension: Tumor invades the visceral peritoneum Margins: All margins are uninvolved by invasive carcinoma, high-grade dysplasia, intramucosal adenocarcinoma, and adenoma  Margins examined: Proximal, distal, and radial Treatment Effect: No known presurgical therapy Lymphovascular Invasion: Not identified Perineural Invasion: Not identified Tumor Deposits: Not identified Regional Lymph Nodes: Number of Lymph Nodes Involved: 1 Number of Lymph Nodes Examined: 44  Pathologic Stage Classification (pTNM, AJCC 8th Edition): [id number]  Results of immunohistochemistry for MMR (mismatch repair proteins) will be resulted in an addendum.   GROSS DESCRIPTION: A. Labeled: Right colon Received: In formalin Type of procedure: Right colectomy Measurements: 5.5 cm long up to 2.2 cm in diameter terminal ileum, 32.1 cm long up to 7 cm in diameter colon, 8.5 cm long up to 0.8 cm in diameter appendix Specimen integrity: Received intact Orientation: Mesenteric margin inked orange and remaining external surface blue Number of masses: 1 Location of mass: 8.7 cm distal to ileocecal valve Size of mass: 3.6 cm long by 3.5 cm in circumference Description of mass: Apple core-like lesion with central ulceration, surrounding slightly raised tan mucosa; mass is retracting adjacent tissue Bowel circumference at site of mass: 4.5 cm Gross depth of invasion: Completely effaces muscle wall with attached and contracted omentum in this area Macroscopic perforation: None grossly  identified Margins: Proximal: 14.2 cm Distal: 16.9 cm Mesenteric: 3.1 cm Luminal obstruction:  90% Other remarkable findings: Also attached at the tumor is a 31.2 x 9.1 x 1.5 cm portion of omentum. There are fibrous adhesions focally on the appendix. Black ink from previous surgical marking is near the tumor. Lymph nodes: Identified (largest 1.2 x 0.8 x 0.3 cm)  Block summary: 1 - en face proximal margin 2 - en face distal margin 3 - perpendicular mesenteric margin 4 - representative tumor with attached omentum 5-7 - additional representative sections of mass (6-7 is one full-thickness section divided) 8 - representative cross-sections and longitudinal tip of appendix 9-16- five lymph node candidates in each cassette 17 - four lymph nodes   Final Diagnosis performed by Kathy Lemma, MD. Electronically signed 07/10/2018 4:57:36PM The electronic signature indicates that the named Attending Pathologist has evaluated the specimen  Technical component performed at [facility], [address], [place], [place] [address] Lab: [phone] Dir: Kathy Farmer, MD, MMMProfessional component performed at [facility], [name], [place], [place], [place] [address] Lab: [phone] Dir: Kathy Nims. Reuel Derby, MD  ADDENDUM: Immunohistochemistry (IHC) Testing for DNA Mismatch Repair (MMR) Proteins: Results: Kathy [name]: Intact nuclear expression Kathy [name]: Intact nuclear expression Kathy [name]: Intact nuclear expression Kathy [name]: Intact nuclear expression IHC Interpretation: No loss of nuclear expression of MMR proteins: Low probability of MSI-H.  Intact expression of all 4 proteins indicates that MMR enzymes tested are intact but does not entirely exclude Lynch syndrome, as approximately 5% of families have a missense mutation (especially in [place]) that can lead to a nonfunctional protein with retained antigenicity. If there is strong clinical suspicion for hereditary cancer, then additional  genetic testing can be considered.  Testing was performed on block: A7 IHC slides were prepared by [place], [place], and interpreted by Dr. Dicie [name]. All controls stained appropriately.  This test was developed and its performance characteristics determined by [facility]. It has not been cleared or approved by the [place] Food and Drug Administration. The FDA does not require this test to go through premarket FDA review. This test is used for clinical purposes. It should not be regarded as investigational or for research. This laboratory is certified under the Clinical Laboratory Improvement Amendments (CLIA) as qualified to perform high complexity clinical laboratory testing.    Addendum #1 performed by Kathy Lemma, MD. Electronically signed 07/15/2018 7:52:37PM The electronic signature indicates that the named Attending Pathologist has evaluated the specimen  Technical component performed at [facility], [address] [place], [place] [address] Lab: [phone] Dir: Kathy Farmer, MD, MMMProfessional component performed at [facility], [facility], [place], [place],  [address] Lab: [phone] Dir: Kathy Nims. 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 Healing well.  No issues. Staples removed in office. Healing ridge will flatten and resolve on its own.  Continue activity restrictions as previously discussed.   Discussed at length with patient, son and daughter, regarding her pathology results and one positive lymph node noted on surgical specimen.  We reviewed the NCCN guidelines together and I stated how it is likely she will be recommended chemotherapy at her oncologist visit this upcoming Thursday.  I explained to her that in order to start chemotherapy we will likely need to place a port.  I recommended that they call back our office as soon as  they have a discussion with the oncologist and decide on initiation of chemotherapy so that we can schedule her for  port placement.  At the time of that phone call we will discussed the risk benefits alternatives of the specific procedure.  All questions concerns addressed at this time.

## 2018-07-30 NOTE — Op Note (Signed)
OP NOTE  DATE OF PROCEDURE: 07/30/2018   SURGEON: Lysle Pearl  ANESTHESIA: LMA  PRE-OPERATIVE DIAGNOSIS: colon cancer requring port for chemotherapy   POST-OPERATIVE DIAGNOSIS: same  PROCEDURE(S): (cpt: 63845) 1.) Percutaneous access of RIJ vein under ultrasound guidance   2.) Insertion of tunneled RIJ central venous catheter with subcutaneous port  INTRAOPERATIVE FINDINGS: Patent easily compressible RIJ vein with appropriate respiratory variations and well-secured tunneled central venous catheter with subcutaneous port at completion of the procedure, heplocked after confirming ease of draw and push  ESTIMATED BLOOD LOSS: Minimal (<20 mL)   SPECIMENS: None   IMPLANTS: 64F tunneled Bard PowerPort central venous catheter with subcutaneous port  DRAINS: None   COMPLICATIONS: None apparent   CONDITION AT COMPLETION: Hemodynamically stable, awake   DISPOSITION: PACU   INDICATION(S) FOR PROCEDURE:  Patient is a 71 y.o. female who presented with above diagnosis.  All risks, benefits, and alternatives to above elective procedures were discussed with the patient, who elected to proceed, and informed consent was accordingly obtained at that time.  DETAILS OF PROCEDURE:  Patient was brought to the operative suite and appropriately identified.  Time out performed.  In Trendelenburg position, RIJ venous access site was prepped and draped in the usual sterile fashion, and following a timeout, percutaneous venous access was obtained under ultrasound guidance using Seldinger technique, by which local anesthetic was injected over the area, and access needle was inserted under direct ultrasound visualization through which soft guidewire was advanced with no resistance, over which access needle was withdrawn. Fluoro check noted wire to be at [facility] junction.  Guidewire was secured, attention was directed to injection of local anesthetic along the planned tunnel site, 2-3 cm transverse right chest incision was  made and confirmed to accommodate the subcutaneous port, and flushed catheter was tunneled retrograde from the port site over to the vein access site. Insertion sheath was advanced over the guidewire, which was withdrawn along with the insertion sheath dilator with no resistance. The catheter was introduced through the sheath and tip left in the [place] junction under fluoro guidance and catheter cut to appropriate length.  Catheter connected to port and placed within planned fixation site.  Fluro confirmed no kink within the entire length of the catheter at this point.  Port fixed to the pocket on two side to avoid twisting. Port was confirmed to withdraw blood and flush easily with included Heuber needle, and port heplocked. Dermis at the subcutaneous pocket was re-approximated using buried interrupted 3-0 Vicryl suture, and 4-0 Monocryl suture was used to re-approximate skin at the insertion and subcutaneous port sites in running subcuticular fashion.  Incisions then dressed with dermabond.  Patient was then awakened from anesthesia and transferred to PACU in stable condition.  Sponge and instrument count correct at end of procedure.  CXR post op confirmed proper placement of port and no evidence of pneumothorax.

## 2018-07-30 NOTE — Transfer of Care (Signed)
Immediate Anesthesia Transfer of Care Note  Patient: Kathy [name]  Procedure(s) Performed: INSERTION PORT-A-CATH (Right )  Patient Location: PACU  Anesthesia Type:General  Level of Consciousness: awake  Airway & Oxygen Therapy: Patient Spontanous Breathing  Post-op Assessment: Report given to RN  Post vital signs: stable  Last Vitals:  Vitals Value Taken Time  BP 152/61 07/30/2018 10:40 AM  Temp 36.2 C 07/30/2018 10:40 AM  Pulse 65 07/30/2018 10:42 AM  Resp 11 07/30/2018 10:42 AM  SpO2 100 % 07/30/2018 10:42 AM  Vitals shown include unvalidated device data.  Last Pain:  Vitals:   07/30/18 1040  TempSrc:   PainSc: Asleep         Complications: No apparent anesthesia complications

## 2018-07-31 NOTE — Anesthesia Postprocedure Evaluation (Signed)
Anesthesia Post Note  Patient: Kathy [name]  Procedure(s) Performed: INSERTION PORT-A-CATH (Right )  Patient location during evaluation: PACU Anesthesia Type: General Level of consciousness: awake and alert and oriented Pain management: pain level controlled Vital Signs Assessment: post-procedure vital signs reviewed and stable Respiratory status: spontaneous breathing Cardiovascular status: blood pressure returned to baseline Anesthetic complications: no     Last Vitals:  Vitals:   07/30/18 1110 07/30/18 1119  BP: (!) 142/56 (!) 154/84  Pulse: 65 65  Resp: 13 18  Temp: (!) 36.2 C (!) 35.9 C  SpO2: 95%     Last Pain:  Vitals:   07/31/18 0825  TempSrc:   PainSc: 0-No pain                 Ever Gustafson

## 2018-08-03 ENCOUNTER — Inpatient Hospital Stay: Payer: Medicare Other

## 2018-08-03 ENCOUNTER — Inpatient Hospital Stay (HOSPITAL_BASED_OUTPATIENT_CLINIC_OR_DEPARTMENT_OTHER): Payer: Medicare Other | Admitting: Oncology

## 2018-08-03 ENCOUNTER — Encounter: Payer: Self-pay | Admitting: Oncology

## 2018-08-03 ENCOUNTER — Telehealth: Payer: Self-pay | Admitting: *Deleted

## 2018-08-03 VITALS — BP 123/77 | HR 82 | Temp 97.5°F | Resp 18 | Ht 64.0 in | Wt 151.8 lb

## 2018-08-03 DIAGNOSIS — J449 Chronic obstructive pulmonary disease, unspecified: Secondary | ICD-10-CM

## 2018-08-03 DIAGNOSIS — K5900 Constipation, unspecified: Secondary | ICD-10-CM

## 2018-08-03 DIAGNOSIS — D1803 Hemangioma of intra-abdominal structures: Secondary | ICD-10-CM

## 2018-08-03 DIAGNOSIS — E559 Vitamin D deficiency, unspecified: Secondary | ICD-10-CM

## 2018-08-03 DIAGNOSIS — Z79899 Other long term (current) drug therapy: Secondary | ICD-10-CM

## 2018-08-03 DIAGNOSIS — D509 Iron deficiency anemia, unspecified: Secondary | ICD-10-CM

## 2018-08-03 DIAGNOSIS — Z7982 Long term (current) use of aspirin: Secondary | ICD-10-CM

## 2018-08-03 DIAGNOSIS — M129 Arthropathy, unspecified: Secondary | ICD-10-CM

## 2018-08-03 DIAGNOSIS — Z5111 Encounter for antineoplastic chemotherapy: Secondary | ICD-10-CM | POA: Diagnosis not present

## 2018-08-03 DIAGNOSIS — C183 Malignant neoplasm of hepatic flexure: Secondary | ICD-10-CM

## 2018-08-03 DIAGNOSIS — M810 Age-related osteoporosis without current pathological fracture: Secondary | ICD-10-CM

## 2018-08-03 DIAGNOSIS — F209 Schizophrenia, unspecified: Secondary | ICD-10-CM

## 2018-08-03 DIAGNOSIS — C182 Malignant neoplasm of ascending colon: Secondary | ICD-10-CM

## 2018-08-03 DIAGNOSIS — C779 Secondary and unspecified malignant neoplasm of lymph node, unspecified: Secondary | ICD-10-CM

## 2018-08-03 DIAGNOSIS — E538 Deficiency of other specified B group vitamins: Secondary | ICD-10-CM

## 2018-08-03 DIAGNOSIS — Z87891 Personal history of nicotine dependence: Secondary | ICD-10-CM

## 2018-08-03 LAB — COMPREHENSIVE METABOLIC PANEL
ALBUMIN: 4 g/dL (ref 3.5–5.0)
ALT: 11 U/L (ref 0–44)
ANION GAP: 9 (ref 5–15)
AST: 16 U/L (ref 15–41)
Alkaline Phosphatase: 73 U/L (ref 38–126)
BILIRUBIN TOTAL: 0.4 mg/dL (ref 0.3–1.2)
BUN: 19 mg/dL (ref 8–23)
CO2: 24 mmol/L (ref 22–32)
Calcium: 9.1 mg/dL (ref 8.9–10.3)
Chloride: 106 mmol/L (ref 98–111)
Creatinine, Ser: 0.62 mg/dL (ref 0.44–1.00)
GFR calc Af Amer: >60 mL/min (ref >60)
GFR calc non Af Amer: >60 mL/min (ref >60)
GLUCOSE: 108 mg/dL — AB (ref 70–99)
POTASSIUM: 4 mmol/L (ref 3.5–5.1)
SODIUM: 139 mmol/L (ref 135–145)
Total Protein: 7.1 g/dL (ref 6.5–8.1)

## 2018-08-03 LAB — CBC WITH DIFFERENTIAL/PLATELET
ABS IMMATURE GRANULOCYTES: 0.02 10*3/uL (ref 0.00–0.07)
BASOS ABS: 0.1 10*3/uL (ref 0.0–0.1)
Basophils Relative: 1 %
Eosinophils Absolute: 0.4 10*3/uL (ref 0.0–0.5)
Eosinophils Relative: 5 %
HCT: 37.1 % (ref 36.0–46.0)
Hemoglobin: 11.8 g/dL — ABNORMAL LOW (ref 12.0–15.0)
IMMATURE GRANULOCYTES: 0 %
Lymphocytes Relative: 17 %
Lymphs Abs: 1.4 10*3/uL (ref 0.7–4.0)
MCH: 28 pg (ref 26.0–34.0)
MCHC: 31.8 g/dL (ref 30.0–36.0)
MCV: 88.1 fL (ref 80.0–100.0)
MONOS PCT: 9 %
Monocytes Absolute: 0.7 10*3/uL (ref 0.1–1.0)
NEUTROS ABS: 5.3 10*3/uL (ref 1.7–7.7)
NEUTROS PCT: 68 %
NRBC: 0 % (ref 0.0–0.2)
PLATELETS: 268 10*3/uL (ref 150–400)
RBC: 4.21 MIL/uL (ref 3.87–5.11)
RDW: 14.9 % (ref 11.5–15.5)
WBC: 7.9 10*3/uL (ref 4.0–10.5)

## 2018-08-03 LAB — FERRITIN: Ferritin: 8 ng/mL — ABNORMAL LOW (ref 11–307)

## 2018-08-03 LAB — IRON AND TIBC
Iron: 34 ug/dL (ref 28–170)
SATURATION RATIOS: 11 % (ref 10.4–31.8)
TIBC: 305 ug/dL (ref 250–450)
UIBC: 271 ug/dL

## 2018-08-03 MED ORDER — PALONOSETRON HCL INJECTION 0.25 MG/5ML
0.2500 mg | Freq: Once | INTRAVENOUS | Status: AC
  Administered 2018-08-03: 0.25 mg via INTRAVENOUS
  Filled 2018-08-03: qty 5

## 2018-08-03 MED ORDER — SODIUM CHLORIDE 0.9% FLUSH
10.0000 mL | INTRAVENOUS | Status: DC | PRN
  Administered 2018-08-03: 10 mL via INTRAVENOUS
  Filled 2018-08-03: qty 10

## 2018-08-03 MED ORDER — SODIUM CHLORIDE 0.9 % IV SOLN
2400.0000 mg/m2 | INTRAVENOUS | Status: DC
  Administered 2018-08-03: 4300 mg via INTRAVENOUS
  Filled 2018-08-03: qty 86

## 2018-08-03 MED ORDER — FLUOROURACIL CHEMO INJECTION 2.5 GM/50ML
400.0000 mg/m2 | Freq: Once | INTRAVENOUS | Status: AC
  Administered 2018-08-03: 700 mg via INTRAVENOUS
  Filled 2018-08-03: qty 14

## 2018-08-03 MED ORDER — OXALIPLATIN CHEMO INJECTION 100 MG/20ML
65.0000 mg/m2 | Freq: Once | INTRAVENOUS | Status: AC
  Administered 2018-08-03: 115 mg via INTRAVENOUS
  Filled 2018-08-03: qty 10

## 2018-08-03 MED ORDER — HEPARIN SOD (PORK) LOCK FLUSH 100 UNIT/ML IV SOLN: 500.0000 [IU] | Freq: Once | INTRAVENOUS | Status: DC

## 2018-08-03 MED ORDER — DEXTROSE 5 % IV SOLN
Freq: Once | INTRAVENOUS | Status: AC
  Administered 2018-08-03: 09:00:00 via INTRAVENOUS
  Filled 2018-08-03: qty 250

## 2018-08-03 MED ORDER — DEXAMETHASONE SODIUM PHOSPHATE 10 MG/ML IJ SOLN
10.0000 mg | Freq: Once | INTRAMUSCULAR | Status: AC
  Administered 2018-08-03: 10 mg via INTRAVENOUS
  Filled 2018-08-03: qty 1

## 2018-08-03 MED ORDER — LEUCOVORIN CALCIUM INJECTION 350 MG
700.0000 mg | Freq: Once | INTRAVENOUS | Status: AC
  Administered 2018-08-03: 700 mg via INTRAVENOUS
  Filled 2018-08-03: qty 35

## 2018-08-03 NOTE — Patient Instructions (Signed)

## 2018-08-03 NOTE — Progress Notes (Signed)
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  She was recently found to have iron deficiency anemia which did not get better despite taking iron supplements.  She underwent colonoscopy by Dr. Alice Reichert in August 2019 which showed a partially obstructing mass in the distal ascending colon.  This was biopsied and was consistent with moderately differentiated adenocarcinoma.  Patient also had a CT abdomen and pelvis with contrast in August 2019 which showed a 3.2 cm constricting apple core type ascending colon cancer near the hepatic flexure.  Small pericolonic lymph nodes but no overt adenopathy.  3.5 mm left hepatic lobe lesion which was eventually characterized as hemangioma on MRI.  CT chest showed small subcentimeter pulmonary nodules which were nonspecific.  Patient underwent right hemicolectomy on 07/06/2018 which showed moderately differentiated adenocarcinoma with invasion through the visceral peritoneum.  Metastatic carcinoma in 1 of 44 regional lymph nodes.  Margins were negative.  No lymphovascular invasion or perineural invasion was identified.  No tumor deposits identified.  pT4a pN1a  MSI stable.   Interval history-reports feeling well.  Denies any specific complaints today her bowel movements are regular and she has not noticed any blood in her stool or urine  ECOG PS- 1 Pain scale-  0 Opioid associated constipation- no  Review of systems- Review of Systems  Constitutional: Negative for chills, fever, malaise/fatigue and weight loss.  HENT: Negative for congestion, ear discharge and nosebleeds.   Eyes: Negative for blurred vision.  Respiratory: Negative for cough, hemoptysis, sputum production, shortness of breath and wheezing.   Cardiovascular: Negative for chest pain, palpitations, orthopnea and claudication.  Gastrointestinal: Negative for abdominal pain, blood in stool, constipation, diarrhea, heartburn, melena, nausea and vomiting.  Genitourinary: Negative for dysuria, flank pain, frequency, hematuria and urgency.  Musculoskeletal: Negative for back pain, joint pain and myalgias.  Skin: Negative for rash.  Neurological: Negative for dizziness, tingling, focal weakness, seizures, weakness and headaches.  Endo/Heme/Allergies: Does not bruise/bleed easily.  Psychiatric/Behavioral: Negative for depression and suicidal ideas. The patient does not have insomnia.       Allergies  Allergen Reactions  . Biaxin [Clarithromycin] Shortness Of Breath  . Levofloxacin     Muscle pain  . Penicillins Rash    Has patient had a PCN reaction causing immediate rash, facial/tongue/throat swelling, SOB or lightheadedness with hypotension: no Has patient had a PCN reaction causing severe rash involving mucus membranes or skin necrosis: no Has patient had a PCN reaction that required hospitalization: no Has patient had a PCN reaction occurring within the last 10 years: no If all of the above answers are "NO", then may proceed with Cephalosporin use.      Past Medical History:  Diagnosis Date  . Arthritis    hips  . B12 deficiency   . Colon cancer ([place]) 07/06/2018  . 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 COPD (chronic obstructive pulmonary disease) ([place])   . Fibrocystic breast disease   . Mediastinal mass  removed 1/16  . Motion sickness    fair rides  . Osteoporosis    . Schizophrenia ([place])   . Vitamin D deficiency   . Wears dentures    partial upper and lower     Past Surgical History:  Procedure Laterality Date  . BREAST BIOPSY Left    neg  . BREAST BIOPSY Right 05/04/13   [place] bx/clip-neg  . COLONOSCOPY    . COLONOSCOPY WITH PROPOFOL N/A 06/10/2018   Procedure: COLONOSCOPY WITH PROPOFOL;  Surgeon: Toledo, [name], MD;  Location: [facility];  Service: Gastroenterology;  Laterality: N/A;  . ESOPHAGOGASTRODUODENOSCOPY N/A 06/10/2018   Procedure: ESOPHAGOGASTRODUODENOSCOPY (EGD);  Surgeon: Toledo, [name], MD;  Location: [facility];  Service: Gastroenterology;  Laterality: N/A;  . HALLUX VALGUS AKIN Right 11/20/2016   Procedure: HALLUX VALGUS AKIN;  Surgeon: Samara Deist, DPM;  Location: [place];  Service: Podiatry;  Laterality: Right;  . HALLUX VALGUS AUSTIN Right 11/20/2016   Procedure: HALLUX VALGUS AUSTIN  Alroy Dust) right;  Surgeon: Samara Deist, DPM;  Location: [place];  Service: Podiatry;  Laterality: Right;  IVA with Popliteal  . HALLUX VALGUS AUSTIN Left 01/15/2017   Procedure: HALLUX VALGUS AUSTIN  Correction left foot  Iva Popiteal;  Surgeon: Samara Deist, DPM;  Location: [place];  Service: Podiatry;  Laterality: Left;  IVA Popliteal   . LAPAROSCOPIC RIGHT COLECTOMY N/A 07/06/2018   Procedure: LAPAROSCOPIC COLECTOMY;  Surgeon: Benjamine Sprague, DO;  Location: [facility];  Service: General;  Laterality: N/A;  . MEDIASTINAL MASS EXCISION Left 11/09/2014   Dr. Wynelle Cleveland, [place]  . PORTACATH PLACEMENT Right 07/30/2018   Procedure: INSERTION PORT-A-CATH;  Surgeon: Benjamine Sprague, DO;  Location: [facility];  Service: General;  Laterality: Right;  . THORACOSCOPY Left 11/09/2014   with excision mediastinal mass  . TUBAL LIGATION      Social History   Socioeconomic History  . Marital status: Widowed    Spouse name: Not on file  . Number of children: Not on file  . Years of education: Not on file  . Highest  education level: Not on file  Occupational History  . Not on file  Social Needs  . Financial resource strain: Not on file  . Food insecurity:    Worry: Not on file    Inability: Not on file  . Transportation needs:    Medical: Not on file    Non-medical: Not on file  Tobacco Use  . Smoking status: Former Smoker    Last attempt to quit: 10/18/2014    Years since quitting: 3.7  . Smokeless tobacco: Never Used  Substance and Sexual Activity  . Alcohol use: No  . Drug use: Never  . Sexual activity: Not on file  Lifestyle  . Physical activity:    Days per week: Not on file    Minutes per session: Not on file  . Stress: Not on file  Relationships  . Social connections:    Talks on phone: Not on file    Gets together: Not on file    Attends religious service: Not on file    Active member of club or organization: Not on file    Attends meetings of clubs or organizations: Not on file    Relationship status: Not on file  . Intimate partner violence:    Fear of current or ex partner: Not on file    Emotionally abused: Not on file    Physically abused: Not on file    Forced  sexual activity: Not on file  Other Topics Concern  . Not on file  Social History Narrative  . Not on file    Family History  Problem Relation Age of Onset  . Cancer Father   . Breast cancer Neg Hx      Current Outpatient Medications:  .  acetaminophen (TYLENOL) 325 MG tablet, Take 2 tablets (650 mg total) by mouth every 8 (eight) hours as needed for mild pain., Disp: 40 tablet, Rfl: 0 .  ASPIRIN 81 PO, Take 81 mg by mouth daily. , Disp: , Rfl:  .  Cholecalciferol (VITAMIN D3) 2000 units TABS, Take 2,000 Units by mouth daily. , Disp: , Rfl:  .  conjugated estrogens (PREMARIN) vaginal cream, Place 1 Applicatorful vaginally 2 (two) times a week. , Disp: , Rfl:  .  dexamethasone (DECADRON) 4 MG tablet, Take 2 tablets (8 mg total) by mouth daily. Start the day after chemotherapy for 2 days. Take with food.  (Patient not taking: Reported on 07/30/2018), Disp: 30 tablet, Rfl: 1 .  docusate sodium (COLACE) 100 MG capsule, Take 1 capsule (100 mg total) by mouth 2 (two) times daily as needed for up to 10 days for mild constipation., Disp: 20 capsule, Rfl: 0 .  ibuprofen (ADVIL,MOTRIN) 800 MG tablet, Take 1 tablet (800 mg total) by mouth every 8 (eight) hours as needed for mild pain or moderate pain., Disp: 30 tablet, Rfl: 0 .  lidocaine-prilocaine (EMLA) cream, Apply to affected area once (Patient not taking: Reported on 07/30/2018), Disp: 30 g, Rfl: 3 .  LORazepam (ATIVAN) 0.5 MG tablet, Take 1 tablet (0.5 mg total) by mouth every 6 (six) hours as needed (Nausea or vomiting). (Patient not taking: Reported on 07/30/2018), Disp: 30 tablet, Rfl: 0 .  ondansetron (ZOFRAN) 8 MG tablet, Take 1 tablet (8 mg total) by mouth 2 (two) times daily as needed for refractory nausea / vomiting. Start on day 3 after chemotherapy. (Patient not taking: Reported on 07/30/2018), Disp: 30 tablet, Rfl: 1 .  prochlorperazine (COMPAZINE) 10 MG tablet, Take 1 tablet (10 mg total) by mouth every 6 (six) hours as needed (Nausea or vomiting). (Patient not taking: Reported on 07/30/2018), Disp: 30 tablet, Rfl: 1 .  PROCYCLIDINE HCL PO, Take 5 mg by mouth 3 (three) times daily. (Kemadrine) , Disp: , Rfl:  .  thiothixene (NAVANE) 5 MG capsule, Take 10 mg by mouth at bedtime. , Disp: , Rfl:  .  vitamin B-12 (CYANOCOBALAMIN) 1000 MCG tablet, Take 1,000 mcg by mouth daily. , Disp: , Rfl:  .  vitamin C (ASCORBIC ACID) 500 MG tablet, Take 500 mg by mouth 3 (three) times daily., Disp: , Rfl:   Physical exam:  Vitals:   08/03/18 0849  BP: 123/77  Pulse: 82  Resp: 18  Temp: (!) 97.5 F (36.4 C)  TempSrc: Tympanic  SpO2: 99%  Weight: 151 lb 12.6 oz (68.9 kg)  Height: 5' 4"  (1.626 m)   Physical Exam  Constitutional: She is oriented to person, place, and time. She appears well-developed and well-nourished.  She is elderly and somewhat  frail.  She ambulates slowly and has resting tremors  HENT:  Head: Normocephalic and atraumatic.  Eyes: Pupils are equal, round, and reactive to light. EOM are normal.  Neck: Normal range of motion.  Cardiovascular: Normal rate, regular rhythm and normal heart sounds.  Pulmonary/Chest: Effort normal and breath sounds normal.  Abdominal: Soft. Bowel sounds are normal.  Neurological: She is alert and oriented to person, place,  and time.  Skin: Skin is warm and dry.     CMP Latest Ref Rng & Units 08/03/2018  Glucose 70 - 99 mg/dL 108(H)  BUN 8 - 23 mg/dL 19  Creatinine 0.44 - 1.00 mg/dL 0.62  Sodium 135 - 145 mmol/L 139  Potassium 3.5 - 5.1 mmol/L 4.0  Chloride 98 - 111 mmol/L 106  CO2 22 - 32 mmol/L 24  Calcium 8.9 - 10.3 mg/dL 9.1  Total Protein 6.5 - 8.1 g/dL 7.1  Total Bilirubin 0.3 - 1.2 mg/dL 0.4  Alkaline Phos 38 - 126 U/L 73  AST 15 - 41 U/L 16  ALT 0 - 44 U/L 11   CBC Latest Ref Rng & Units 08/03/2018  WBC 4.0 - 10.5 K/uL 7.9  Hemoglobin 12.0 - 15.0 g/dL 11.8(L)  Hematocrit 36.0 - 46.0 % 37.1  Platelets 150 - 400 K/uL 268    No images are attached to the encounter.  Dg Chest Port 1 View  Result Date: 07/30/2018 CLINICAL DATA:  Port-A-Cath placement. EXAM: PORTABLE CHEST 1 VIEW COMPARISON:  Chest CT 06/17/2018 FINDINGS: Right internal jugular approach injectable port terminates in the expected location of mid superior vena cava. Cardiomediastinal silhouette is normal. Mediastinal contours appear intact. Calcific atherosclerotic disease of the aorta. There is no evidence of focal airspace consolidation, pleural effusion or pneumothorax. Osseous structures are without acute abnormality. Soft tissues are grossly normal. IMPRESSION: Post right internal jugular approach injectable port placement. No evidence of pneumothorax. Electronically Signed   By: Fidela Salisbury M.D.   On: 07/30/2018 11:27   Dg C-arm 1-60 Min-no Report  Result Date: 07/30/2018 Fluoroscopy was  utilized by the requesting physician.  No radiographic interpretation.     Assessment and plan- Patient is a 71 y.o. female with h/o stage IIIb adenocarcinoma of the colon pT4apN1a cM0 status post hemicolectomy.  She is here for on treatment assessment prior to cycle 1 of adjuvant FOLFOX chemotherapy  Counts okay to proceed with cycle 1 of adjuvant FOLFOX chemotherapy today.  I will be dose reducing her anxiety back up to 65 mg/m to do her age and frailty.  Again discussed risks and benefits of chemotherapy including all but not limited to fatigue, nausea, vomiting, low blood counts, risk of infections and hospitalization.  Patient understands and agrees to proceed as planned  I will see her back in 2 weeks time with CBC and CMP prior to cycle 2 of adjuvant FOLFOX chemotherapy.  I have also reviewed her PRN nausea medications and advised her what to take in the order of priority.    Patient will call us in the interim if she has any questions or concerns.   Visit Diagnosis 1. Malignant neoplasm of ascending colon ([place])   2. Encounter for antineoplastic chemotherapy      Dr. Randa Evens, MD, MPH [facility] [phone] 08/03/2018 1:00 PM

## 2018-08-03 NOTE — Telephone Encounter (Signed)
Called to speak with [name] and let them know that ferritin was low and when patient comes in for her next 2 chemo appts she will get iron treatment at same time. He is not surprised because in the past she is had to have b12 shots for anemia. He will let his mother know.

## 2018-08-03 NOTE — Addendum Note (Signed)
Addended by: Randa Evens C on: 08/03/2018 04:40 PM   Modules accepted: Orders

## 2018-08-03 NOTE — Progress Notes (Signed)
No new changes noted today 

## 2018-08-04 LAB — CEA: CEA1: 3 ng/mL (ref 0.0–4.7)

## 2018-08-05 ENCOUNTER — Inpatient Hospital Stay: Payer: Medicare Other

## 2018-08-05 VITALS — BP 143/78 | HR 63 | Temp 96.9°F | Resp 18

## 2018-08-05 DIAGNOSIS — C183 Malignant neoplasm of hepatic flexure: Secondary | ICD-10-CM | POA: Diagnosis not present

## 2018-08-05 MED ORDER — SODIUM CHLORIDE 0.9% FLUSH
10.0000 mL | INTRAVENOUS | Status: DC | PRN
  Administered 2018-08-05: 10 mL
  Filled 2018-08-05: qty 10

## 2018-08-05 MED ORDER — HEPARIN SOD (PORK) LOCK FLUSH 100 UNIT/ML IV SOLN
500.0000 [IU] | Freq: Once | INTRAVENOUS | Status: AC | PRN
  Administered 2018-08-05: 500 [IU]

## 2018-08-17 ENCOUNTER — Inpatient Hospital Stay: Payer: Medicare Other

## 2018-08-17 ENCOUNTER — Encounter: Payer: Self-pay | Admitting: Oncology

## 2018-08-17 ENCOUNTER — Inpatient Hospital Stay (HOSPITAL_BASED_OUTPATIENT_CLINIC_OR_DEPARTMENT_OTHER): Payer: Medicare Other | Admitting: Oncology

## 2018-08-17 VITALS — BP 124/74 | HR 65 | Resp 18

## 2018-08-17 VITALS — BP 120/80 | HR 78 | Temp 96.4°F | Resp 18 | Ht 64.0 in | Wt 152.8 lb

## 2018-08-17 DIAGNOSIS — J449 Chronic obstructive pulmonary disease, unspecified: Secondary | ICD-10-CM

## 2018-08-17 DIAGNOSIS — C183 Malignant neoplasm of hepatic flexure: Secondary | ICD-10-CM

## 2018-08-17 DIAGNOSIS — C779 Secondary and unspecified malignant neoplasm of lymph node, unspecified: Secondary | ICD-10-CM | POA: Diagnosis not present

## 2018-08-17 DIAGNOSIS — K5900 Constipation, unspecified: Secondary | ICD-10-CM

## 2018-08-17 DIAGNOSIS — Z5111 Encounter for antineoplastic chemotherapy: Secondary | ICD-10-CM

## 2018-08-17 DIAGNOSIS — C182 Malignant neoplasm of ascending colon: Secondary | ICD-10-CM

## 2018-08-17 DIAGNOSIS — F209 Schizophrenia, unspecified: Secondary | ICD-10-CM

## 2018-08-17 DIAGNOSIS — E538 Deficiency of other specified B group vitamins: Secondary | ICD-10-CM

## 2018-08-17 DIAGNOSIS — D509 Iron deficiency anemia, unspecified: Secondary | ICD-10-CM | POA: Diagnosis not present

## 2018-08-17 DIAGNOSIS — R200 Anesthesia of skin: Secondary | ICD-10-CM

## 2018-08-17 DIAGNOSIS — Z79899 Other long term (current) drug therapy: Secondary | ICD-10-CM

## 2018-08-17 DIAGNOSIS — M129 Arthropathy, unspecified: Secondary | ICD-10-CM

## 2018-08-17 DIAGNOSIS — Z87891 Personal history of nicotine dependence: Secondary | ICD-10-CM

## 2018-08-17 DIAGNOSIS — Z7982 Long term (current) use of aspirin: Secondary | ICD-10-CM

## 2018-08-17 DIAGNOSIS — D1803 Hemangioma of intra-abdominal structures: Secondary | ICD-10-CM

## 2018-08-17 DIAGNOSIS — M810 Age-related osteoporosis without current pathological fracture: Secondary | ICD-10-CM

## 2018-08-17 DIAGNOSIS — E559 Vitamin D deficiency, unspecified: Secondary | ICD-10-CM

## 2018-08-17 LAB — CBC WITH DIFFERENTIAL/PLATELET
ABS IMMATURE GRANULOCYTES: 0.02 10*3/uL (ref 0.00–0.07)
Basophils Absolute: 0 10*3/uL (ref 0.0–0.1)
Basophils Relative: 0 %
Eosinophils Absolute: 0.3 10*3/uL (ref 0.0–0.5)
Eosinophils Relative: 7 %
HEMATOCRIT: 35.3 % — AB (ref 36.0–46.0)
Hemoglobin: 11.1 g/dL — ABNORMAL LOW (ref 12.0–15.0)
Immature Granulocytes: 0 %
LYMPHS ABS: 1.1 10*3/uL (ref 0.7–4.0)
LYMPHS PCT: 23 %
MCH: 27.7 pg (ref 26.0–34.0)
MCHC: 31.4 g/dL (ref 30.0–36.0)
MCV: 88 fL (ref 80.0–100.0)
MONO ABS: 0.7 10*3/uL (ref 0.1–1.0)
MONOS PCT: 15 %
NEUTROS ABS: 2.7 10*3/uL (ref 1.7–7.7)
Neutrophils Relative %: 55 %
Platelets: 208 10*3/uL (ref 150–400)
RBC: 4.01 MIL/uL (ref 3.87–5.11)
RDW: 13.4 % (ref 11.5–15.5)
WBC: 4.8 10*3/uL (ref 4.0–10.5)
nRBC: 0 % (ref 0.0–0.2)

## 2018-08-17 LAB — COMPREHENSIVE METABOLIC PANEL
ALK PHOS: 76 U/L (ref 38–126)
ALT: 11 U/L (ref 0–44)
AST: 17 U/L (ref 15–41)
Albumin: 3.7 g/dL (ref 3.5–5.0)
Anion gap: 8 (ref 5–15)
BUN: 16 mg/dL (ref 8–23)
CALCIUM: 9 mg/dL (ref 8.9–10.3)
CO2: 25 mmol/L (ref 22–32)
Chloride: 106 mmol/L (ref 98–111)
Creatinine, Ser: 0.56 mg/dL (ref 0.44–1.00)
GFR calc non Af Amer: >60 mL/min (ref >60)
GLUCOSE: 103 mg/dL — AB (ref 70–99)
Potassium: 4 mmol/L (ref 3.5–5.1)
SODIUM: 139 mmol/L (ref 135–145)
Total Bilirubin: 0.2 mg/dL — ABNORMAL LOW (ref 0.3–1.2)
Total Protein: 6.7 g/dL (ref 6.5–8.1)

## 2018-08-17 LAB — FOLATE: FOLATE: 18.1 ng/mL (ref >5.9)

## 2018-08-17 LAB — VITAMIN B12: VITAMIN B 12: 617 pg/mL (ref 180–914)

## 2018-08-17 MED ORDER — DEXAMETHASONE SODIUM PHOSPHATE 10 MG/ML IJ SOLN
10.0000 mg | Freq: Once | INTRAMUSCULAR | Status: AC
  Administered 2018-08-17: 10 mg via INTRAVENOUS
  Filled 2018-08-17: qty 1

## 2018-08-17 MED ORDER — DEXTROSE 5 % IV SOLN
Freq: Once | INTRAVENOUS | Status: AC
  Administered 2018-08-17: 10:00:00 via INTRAVENOUS
  Filled 2018-08-17: qty 250

## 2018-08-17 MED ORDER — SODIUM CHLORIDE 0.9 % IV SOLN
510.0000 mg | Freq: Once | INTRAVENOUS | Status: AC
  Administered 2018-08-17: 510 mg via INTRAVENOUS
  Filled 2018-08-17: qty 17

## 2018-08-17 MED ORDER — FLUOROURACIL CHEMO INJECTION 2.5 GM/50ML
400.0000 mg/m2 | Freq: Once | INTRAVENOUS | Status: AC
  Administered 2018-08-17: 700 mg via INTRAVENOUS
  Filled 2018-08-17: qty 14

## 2018-08-17 MED ORDER — SODIUM CHLORIDE 0.9% FLUSH
10.0000 mL | INTRAVENOUS | Status: DC | PRN
  Administered 2018-08-17: 10 mL via INTRAVENOUS
  Filled 2018-08-17: qty 10

## 2018-08-17 MED ORDER — PALONOSETRON HCL INJECTION 0.25 MG/5ML
0.2500 mg | Freq: Once | INTRAVENOUS | Status: AC
  Administered 2018-08-17: 0.25 mg via INTRAVENOUS
  Filled 2018-08-17: qty 5

## 2018-08-17 MED ORDER — LEUCOVORIN CALCIUM INJECTION 350 MG
700.0000 mg | Freq: Once | INTRAVENOUS | Status: AC
  Administered 2018-08-17: 700 mg via INTRAVENOUS
  Filled 2018-08-17: qty 35

## 2018-08-17 MED ORDER — HEPARIN SOD (PORK) LOCK FLUSH 100 UNIT/ML IV SOLN: 500.0000 [IU] | Freq: Once | INTRAVENOUS | Status: DC

## 2018-08-17 MED ORDER — OXALIPLATIN CHEMO INJECTION 100 MG/20ML
65.0000 mg/m2 | Freq: Once | INTRAVENOUS | Status: AC
  Administered 2018-08-17: 115 mg via INTRAVENOUS
  Filled 2018-08-17: qty 20

## 2018-08-17 MED ORDER — SODIUM CHLORIDE 0.9 % IV SOLN
Freq: Once | INTRAVENOUS | Status: AC
  Administered 2018-08-17: 10:00:00 via INTRAVENOUS
  Filled 2018-08-17: qty 250

## 2018-08-17 MED ORDER — SODIUM CHLORIDE 0.9 % IV SOLN
2400.0000 mg/m2 | INTRAVENOUS | Status: DC
  Administered 2018-08-17: 4300 mg via INTRAVENOUS
  Filled 2018-08-17: qty 86

## 2018-08-17 NOTE — Progress Notes (Signed)
Pt had constipation and was told to take senokot s and miralax as needed and it did work for her.

## 2018-08-17 NOTE — Progress Notes (Signed)
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 She was recently found to have iron deficiency anemia which did not get better despite taking iron supplements. She underwent colonoscopy by Dr. Alice Reichert in August 2019 which showed a partially obstructing mass in the distal ascending colon. This was biopsied and was consistent with moderately differentiated adenocarcinoma. Patient also had a CT abdomen and pelvis with contrast in August 2019 which showed a 3.2 cm constricting apple core type ascending colon cancer near the hepatic flexure. Small pericolonic lymph nodes but no overt adenopathy. 3.5 mm left hepatic lobe lesion which was eventually characterized as hemangioma on MRI. CT chest showed small subcentimeter pulmonary nodules which were nonspecific.  Patient underwent right hemicolectomy on 07/06/2018 which showed moderately differentiated adenocarcinoma with invasion through the visceral peritoneum. Metastatic carcinoma in 1 of 44 regional lymph nodes. Margins were negative. No lymphovascular invasion or perineural invasion was identified. No tumor deposits identified. pT4a pN1aMSI stable.  Cycle 1 of adjuvant FOLFOX chemotherapy started on 08/03/2018    Interval history-he reports mild constipation after chemotherapy which resolved after taking MiraLAX and senna.  She reports mild  tingling numbness in her bilateral feet.  Denies any falls.  Denies any tingling numbness in her hands.  Denies any nausea vomiting.  Appetite is good and she has not lost any weight  ECOG PS- 1 Pain scale- 0   Review of systems- Review of Systems  Constitutional: Negative for chills, fever, malaise/fatigue and weight loss.  HENT: Negative for congestion, ear discharge and nosebleeds.   Eyes: Negative for blurred vision.  Respiratory: Negative for cough, hemoptysis, sputum production, shortness of breath and wheezing.   Cardiovascular: Negative for chest pain, palpitations, orthopnea and claudication.  Gastrointestinal: Positive for constipation. Negative for abdominal pain, blood in stool, diarrhea, heartburn, melena, nausea and vomiting.  Genitourinary: Negative for dysuria, flank pain, frequency, hematuria and urgency.  Musculoskeletal: Negative for back pain, joint pain and myalgias.  Skin: Negative for rash.  Neurological: Positive for tingling. Negative for dizziness, focal weakness, seizures, weakness and headaches.  Endo/Heme/Allergies: Does not bruise/bleed easily.  Psychiatric/Behavioral: Negative for depression and suicidal ideas. The patient does not have insomnia.       Allergies  Allergen Reactions  . Biaxin [Clarithromycin] Shortness Of Breath  . Levofloxacin     Muscle pain  . Penicillins Rash    Has patient had a PCN reaction causing immediate rash, facial/tongue/throat swelling, SOB or lightheadedness with hypotension: no Has patient had a PCN reaction causing severe rash involving mucus membranes or skin necrosis: no Has patient had a PCN reaction that required hospitalization: no Has patient had a PCN reaction occurring within the last 10 years: no If all of the above answers are "NO", then may proceed with Cephalosporin use.      Past Medical History:  Diagnosis Date  . Arthritis    hips  . B12 deficiency   . Colon cancer ([place]) 07/06/2018  . Constipation  due  to slow transit   . COPD (chronic obstructive pulmonary disease) ([place])   . Fibrocystic breast disease   . Mediastinal mass    removed 1/16  . Motion sickness    fair rides  . Osteoporosis   . Schizophrenia ([place])   . Vitamin D deficiency   . Wears dentures    partial upper and lower     Past Surgical History:  Procedure Laterality Date  . BREAST BIOPSY Left    neg  . BREAST BIOPSY Right 05/04/13   [place] bx/clip-neg  . COLONOSCOPY    . COLONOSCOPY WITH PROPOFOL N/A 06/10/2018   Procedure: COLONOSCOPY WITH PROPOFOL;  Surgeon: Toledo, [name], MD;  Location: [facility];  Service: Gastroenterology;  Laterality: N/A;  . ESOPHAGOGASTRODUODENOSCOPY N/A 06/10/2018   Procedure: ESOPHAGOGASTRODUODENOSCOPY (EGD);  Surgeon: Toledo, [name], MD;  Location: [facility];  Service: Gastroenterology;  Laterality: N/A;  . HALLUX VALGUS AKIN Right 11/20/2016   Procedure: HALLUX VALGUS AKIN;  Surgeon: Samara Deist, DPM;  Location: [place];  Service: Podiatry;  Laterality: Right;  . HALLUX VALGUS AUSTIN Right 11/20/2016   Procedure: HALLUX VALGUS AUSTIN  Alroy Dust) right;  Surgeon: Samara Deist, DPM;  Location: [place];  Service: Podiatry;  Laterality: Right;  IVA with Popliteal  . HALLUX VALGUS AUSTIN Left 01/15/2017   Procedure: HALLUX VALGUS AUSTIN  Correction left foot  Iva Popiteal;  Surgeon: Samara Deist, DPM;  Location: [place];  Service: Podiatry;  Laterality: Left;  IVA Popliteal   . LAPAROSCOPIC RIGHT COLECTOMY N/A 07/06/2018   Procedure: LAPAROSCOPIC COLECTOMY;  Surgeon: Benjamine Sprague, DO;  Location: [facility];  Service: General;  Laterality: N/A;  . MEDIASTINAL MASS EXCISION Left 11/09/2014   Dr. Wynelle [place], [place]  . PORTACATH PLACEMENT Right 07/30/2018   Procedure: INSERTION PORT-A-CATH;  Surgeon: Benjamine Sprague, DO;  Location: [facility];  Service: General;  Laterality: Right;  . THORACOSCOPY Left 11/09/2014   with excision mediastinal mass  . TUBAL  LIGATION      Social History   Socioeconomic History  . Marital status: Widowed    Spouse name: Not on file  . Number of children: Not on file  . Years of education: Not on file  . Highest education level: Not on file  Occupational History  . Not on file  Social Needs  . Financial resource strain: Not on file  . Food insecurity:    Worry: Not on file    Inability: Not on file  . Transportation needs:    Medical: Not on file    Non-medical: Not on file  Tobacco Use  . Smoking status: Former Smoker    Last attempt to quit: 10/18/2014    Years since quitting: 3.8  . Smokeless tobacco: Never Used  Substance and Sexual Activity  . Alcohol use: No  . Drug use: Never  . Sexual activity: Not on file  Lifestyle  . Physical activity:    Days per week: Not on file    Minutes per session: Not on file  . Stress: Not on file  Relationships  . Social connections:    Talks on phone: Not on file    Gets together: Not on file    Attends religious service: Not on file    Active member of club or organization: Not on file    Attends meetings of clubs or organizations: Not on file    Relationship status: Not on file  . Intimate partner violence:    Fear of current or  ex partner: Not on file    Emotionally abused: Not on file    Physically abused: Not on file    Forced sexual activity: Not on file  Other Topics Concern  . Not on file  Social History Narrative  . Not on file    Family History  Problem Relation Age of Onset  . Cancer Father   . Breast cancer Neg Hx      Current Outpatient Medications:  .  acetaminophen (TYLENOL) 325 MG tablet, Take 2 tablets (650 mg total) by mouth every 8 (eight) hours as needed for mild pain. (Patient not taking: Reported on 08/03/2018), Disp: 40 tablet, Rfl: 0 .  ASPIRIN 81 PO, Take 81 mg by mouth daily. , Disp: , Rfl:  .  Cholecalciferol (VITAMIN D3) 2000 units TABS, Take 2,000 Units by mouth daily. , Disp: , Rfl:  .  conjugated estrogens  (PREMARIN) vaginal cream, Place 1 Applicatorful vaginally 2 (two) times a week. , Disp: , Rfl:  .  dexamethasone (DECADRON) 4 MG tablet, Take 2 tablets (8 mg total) by mouth daily. Start the day after chemotherapy for 2 days. Take with food. (Patient not taking: Reported on 07/30/2018), Disp: 30 tablet, Rfl: 1 .  ibuprofen (ADVIL,MOTRIN) 800 MG tablet, Take 1 tablet (800 mg total) by mouth every 8 (eight) hours as needed for mild pain or moderate pain. (Patient not taking: Reported on 08/03/2018), Disp: 30 tablet, Rfl: 0 .  lidocaine-prilocaine (EMLA) cream, Apply to affected area once (Patient not taking: Reported on 07/30/2018), Disp: 30 g, Rfl: 3 .  LORazepam (ATIVAN) 0.5 MG tablet, Take 1 tablet (0.5 mg total) by mouth every 6 (six) hours as needed (Nausea or vomiting). (Patient not taking: Reported on 07/30/2018), Disp: 30 tablet, Rfl: 0 .  ondansetron (ZOFRAN) 8 MG tablet, Take 1 tablet (8 mg total) by mouth 2 (two) times daily as needed for refractory nausea / vomiting. Start on day 3 after chemotherapy. (Patient not taking: Reported on 07/30/2018), Disp: 30 tablet, Rfl: 1 .  prochlorperazine (COMPAZINE) 10 MG tablet, Take 1 tablet (10 mg total) by mouth every 6 (six) hours as needed (Nausea or vomiting). (Patient not taking: Reported on 07/30/2018), Disp: 30 tablet, Rfl: 1 .  PROCYCLIDINE HCL PO, Take 5 mg by mouth 3 (three) times daily. (Kemadrine) , Disp: , Rfl:  .  thiothixene (NAVANE) 5 MG capsule, Take 10 mg by mouth at bedtime. , Disp: , Rfl:  .  vitamin B-12 (CYANOCOBALAMIN) 1000 MCG tablet, Take 1,000 mcg by mouth daily. , Disp: , Rfl:  .  vitamin C (ASCORBIC ACID) 500 MG tablet, Take 500 mg by mouth 3 (three) times daily., Disp: , Rfl:  No current facility-administered medications for this visit.   Facility-Administered Medications Ordered in Other Visits:  .  heparin lock flush 100 unit/mL, 500 Units, Intravenous, Once, Sindy Guadeloupe, MD .  sodium chloride flush (NS) 0.9 % injection  10 mL, 10 mL, Intravenous, PRN, Sindy Guadeloupe, MD, 10 mL at 08/17/18 0857  Physical exam:  Vitals:   08/17/18 0911  BP: 120/80  Pulse: 78  Resp: 18  Temp: (!) 96.4 F (35.8 C)  TempSrc: Tympanic  Weight: 152 lb 12.5 oz (69.3 kg)  Height: _0  (1.626 m)   Physical Exam  Constitutional: She is oriented to person, place, and time. She appears well-developed and well-nourished.  HENT:  Head: Normocephalic and atraumatic.  Eyes: Pupils are equal, round, and reactive to light. EOM are normal.  Neck:  Normal range of motion.  Cardiovascular: Normal rate, regular rhythm and normal heart sounds.  Pulmonary/Chest: Effort normal and breath sounds normal.  Abdominal: Soft. Bowel sounds are normal.  Neurological: She is alert and oriented to person, place, and time.  Skin: Skin is warm and dry.     CMP Latest Ref Rng & Units 08/17/2018  Glucose 70 - 99 mg/dL 103(H)  BUN 8 - 23 mg/dL 16  Creatinine 0.44 - 1.00 mg/dL 0.56  Sodium 135 - 145 mmol/L 139  Potassium 3.5 - 5.1 mmol/L 4.0  Chloride 98 - 111 mmol/L 106  CO2 22 - 32 mmol/L 25  Calcium 8.9 - 10.3 mg/dL 9.0  Total Protein 6.5 - 8.1 g/dL 6.7  Total Bilirubin 0.3 - 1.2 mg/dL 0.2(L)  Alkaline Phos 38 - 126 U/L 76  AST 15 - 41 U/L 17  ALT 0 - 44 U/L 11   CBC Latest Ref Rng & Units 08/17/2018  WBC 4.0 - 10.5 K/uL 4.8  Hemoglobin 12.0 - 15.0 g/dL 11.1(L)  Hematocrit 36.0 - 46.0 % 35.3(L)  Platelets 150 - 400 K/uL 208    No images are attached to the encounter.  Dg Chest Port 1 View  Result Date: 07/30/2018 CLINICAL DATA:  Port-A-Cath placement. EXAM: PORTABLE CHEST 1 VIEW COMPARISON:  Chest CT 06/17/2018 FINDINGS: Right internal jugular approach injectable port terminates in the expected location of mid superior vena cava. Cardiomediastinal silhouette is normal. Mediastinal contours appear intact. Calcific atherosclerotic disease of the aorta. There is no evidence of focal airspace consolidation, pleural effusion or  pneumothorax. Osseous structures are without acute abnormality. Soft tissues are grossly normal. IMPRESSION: Post right internal jugular approach injectable port placement. No evidence of pneumothorax. Electronically Signed   By: Fidela Salisbury M.D.   On: 07/30/2018 11:27   Dg C-arm 1-60 Min-no Report  Result Date: 07/30/2018 Fluoroscopy was utilized by the requesting physician.  No radiographic interpretation.     Assessment and plan- Patient is a 72 y.o. female with h/o stage IIIb adenocarcinoma of the colonpT4apN1a cM0status post hemicolectomy.  She is here for on treatment assessment prior to cycle 2 of adjuvant FOLFOX chemotherapy  Counts will be to proceed with cycle 2 of adjuvant chemotherapy today.  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  Continue to monitor.  If her neuropathy worsens I will have a low threshold to drop her oxaliplatin altogether  She is mildly anemic with a hemoglobin of 11.  We did check her iron studies at last visit and her ferritin was low at 8.  I will therefore recommend IV iron 510 mg x 2 weekly.  Discussed risks and benefits of IV iron including all but not limited to headaches, leg swelling and possible risk of infusion reaction.  Patient understands and agrees to proceed as planned.  She will get her second dose of Feraheme with the next cycle of chemotherapy.  I will check her B12 and folate with next set of labs.  I will see her back in 2 weeks time with CBC and CMP for cycle 3 of FOLFOX   Visit Diagnosis 1. Encounter for antineoplastic chemotherapy   2. Malignant neoplasm of ascending colon ([place])   3. Iron deficiency anemia, unspecified iron deficiency anemia type      Dr. Randa Evens, MD, MPH [facility] [phone] 08/17/2018 9:25 AM

## 2018-08-19 ENCOUNTER — Inpatient Hospital Stay: Payer: Medicare Other

## 2018-08-19 VITALS — BP 119/71 | HR 72 | Temp 97.4°F | Resp 18

## 2018-08-19 DIAGNOSIS — C183 Malignant neoplasm of hepatic flexure: Secondary | ICD-10-CM

## 2018-08-19 MED ORDER — HEPARIN SOD (PORK) LOCK FLUSH 100 UNIT/ML IV SOLN
500.0000 [IU] | Freq: Once | INTRAVENOUS | Status: AC | PRN
  Administered 2018-08-19: 500 [IU]

## 2018-08-19 MED ORDER — SODIUM CHLORIDE 0.9% FLUSH
10.0000 mL | INTRAVENOUS | Status: DC | PRN
  Administered 2018-08-19: 10 mL
  Filled 2018-08-19: qty 10

## 2018-08-25 ENCOUNTER — Telehealth: Payer: Self-pay | Admitting: *Deleted

## 2018-08-25 MED ORDER — MAGIC MOUTHWASH W/LIDOCAINE: 10.0000 mL | Freq: Four times a day (QID) | ORAL | 3 refills | Status: DC

## 2018-08-25 NOTE — Telephone Encounter (Signed)
yes

## 2018-08-25 NOTE — Telephone Encounter (Signed)
Prescription signed and faxed to [facility] in [place] per daughter request and patient will pick up rx

## 2018-08-25 NOTE — Telephone Encounter (Signed)
Daughter called reporting that patient has a sore mouth and is asking if she can get prescription for Magic Mouth Wash as mentioned in Chemotherapy Education class. Please advise

## 2018-08-31 ENCOUNTER — Inpatient Hospital Stay: Payer: Medicare Other | Attending: Oncology | Admitting: Oncology

## 2018-08-31 ENCOUNTER — Inpatient Hospital Stay: Payer: Medicare Other

## 2018-08-31 ENCOUNTER — Encounter: Payer: Self-pay | Admitting: Oncology

## 2018-08-31 VITALS — BP 118/76 | HR 67 | Temp 97.0°F | Resp 18 | Ht 64.0 in | Wt 150.4 lb

## 2018-08-31 DIAGNOSIS — M810 Age-related osteoporosis without current pathological fracture: Secondary | ICD-10-CM | POA: Diagnosis not present

## 2018-08-31 DIAGNOSIS — E559 Vitamin D deficiency, unspecified: Secondary | ICD-10-CM | POA: Insufficient documentation

## 2018-08-31 DIAGNOSIS — K1370 Unspecified lesions of oral mucosa: Secondary | ICD-10-CM | POA: Insufficient documentation

## 2018-08-31 DIAGNOSIS — C182 Malignant neoplasm of ascending colon: Secondary | ICD-10-CM

## 2018-08-31 DIAGNOSIS — E538 Deficiency of other specified B group vitamins: Secondary | ICD-10-CM | POA: Diagnosis not present

## 2018-08-31 DIAGNOSIS — M129 Arthropathy, unspecified: Secondary | ICD-10-CM | POA: Diagnosis not present

## 2018-08-31 DIAGNOSIS — J449 Chronic obstructive pulmonary disease, unspecified: Secondary | ICD-10-CM

## 2018-08-31 DIAGNOSIS — C183 Malignant neoplasm of hepatic flexure: Secondary | ICD-10-CM

## 2018-08-31 DIAGNOSIS — D509 Iron deficiency anemia, unspecified: Secondary | ICD-10-CM | POA: Insufficient documentation

## 2018-08-31 DIAGNOSIS — Z87891 Personal history of nicotine dependence: Secondary | ICD-10-CM | POA: Insufficient documentation

## 2018-08-31 DIAGNOSIS — Z5111 Encounter for antineoplastic chemotherapy: Secondary | ICD-10-CM | POA: Diagnosis present

## 2018-08-31 DIAGNOSIS — Z7982 Long term (current) use of aspirin: Secondary | ICD-10-CM | POA: Insufficient documentation

## 2018-08-31 DIAGNOSIS — Z79899 Other long term (current) drug therapy: Secondary | ICD-10-CM | POA: Diagnosis not present

## 2018-08-31 DIAGNOSIS — K5900 Constipation, unspecified: Secondary | ICD-10-CM | POA: Insufficient documentation

## 2018-08-31 DIAGNOSIS — R202 Paresthesia of skin: Secondary | ICD-10-CM | POA: Diagnosis not present

## 2018-08-31 DIAGNOSIS — R200 Anesthesia of skin: Secondary | ICD-10-CM | POA: Diagnosis not present

## 2018-08-31 DIAGNOSIS — F209 Schizophrenia, unspecified: Secondary | ICD-10-CM | POA: Diagnosis not present

## 2018-08-31 LAB — COMPREHENSIVE METABOLIC PANEL
ALT: 12 U/L (ref 0–44)
AST: 19 U/L (ref 15–41)
Albumin: 3.8 g/dL (ref 3.5–5.0)
Alkaline Phosphatase: 76 U/L (ref 38–126)
Anion gap: 10 (ref 5–15)
BUN: 15 mg/dL (ref 8–23)
CO2: 24 mmol/L (ref 22–32)
Calcium: 9.1 mg/dL (ref 8.9–10.3)
Chloride: 106 mmol/L (ref 98–111)
Creatinine, Ser: 0.64 mg/dL (ref 0.44–1.00)
Glucose, Bld: 101 mg/dL — ABNORMAL HIGH (ref 70–99)
POTASSIUM: 3.9 mmol/L (ref 3.5–5.1)
SODIUM: 140 mmol/L (ref 135–145)
Total Bilirubin: 0.6 mg/dL (ref 0.3–1.2)
Total Protein: 6.6 g/dL (ref 6.5–8.1)

## 2018-08-31 LAB — CBC WITH DIFFERENTIAL/PLATELET
Abs Immature Granulocytes: 0.02 10*3/uL (ref 0.00–0.07)
BASOS PCT: 1 %
Basophils Absolute: 0 10*3/uL (ref 0.0–0.1)
Eosinophils Absolute: 0.5 10*3/uL (ref 0.0–0.5)
Eosinophils Relative: 10 %
HCT: 36.7 % (ref 36.0–46.0)
Hemoglobin: 11.8 g/dL — ABNORMAL LOW (ref 12.0–15.0)
Immature Granulocytes: 0 %
Lymphocytes Relative: 24 %
Lymphs Abs: 1.1 10*3/uL (ref 0.7–4.0)
MCH: 28.7 pg (ref 26.0–34.0)
MCHC: 32.2 g/dL (ref 30.0–36.0)
MCV: 89.3 fL (ref 80.0–100.0)
MONO ABS: 0.8 10*3/uL (ref 0.1–1.0)
Monocytes Relative: 17 %
Neutro Abs: 2.3 10*3/uL (ref 1.7–7.7)
Neutrophils Relative %: 48 %
Platelets: 173 10*3/uL (ref 150–400)
RBC: 4.11 MIL/uL (ref 3.87–5.11)
RDW: 14.5 % (ref 11.5–15.5)
WBC: 4.7 10*3/uL (ref 4.0–10.5)
nRBC: 0 % (ref 0.0–0.2)

## 2018-08-31 MED ORDER — OXALIPLATIN CHEMO INJECTION 100 MG/20ML
65.0000 mg/m2 | Freq: Once | INTRAVENOUS | Status: AC
  Administered 2018-08-31: 115 mg via INTRAVENOUS
  Filled 2018-08-31: qty 20

## 2018-08-31 MED ORDER — SODIUM CHLORIDE 0.9 % IV SOLN
2400.0000 mg/m2 | INTRAVENOUS | Status: DC
  Administered 2018-08-31: 4300 mg via INTRAVENOUS
  Filled 2018-08-31: qty 86

## 2018-08-31 MED ORDER — LEUCOVORIN CALCIUM INJECTION 350 MG
700.0000 mg | Freq: Once | INTRAVENOUS | Status: AC
  Administered 2018-08-31: 700 mg via INTRAVENOUS
  Filled 2018-08-31: qty 35

## 2018-08-31 MED ORDER — DEXTROSE 5 % IV SOLN
Freq: Once | INTRAVENOUS | Status: AC
  Administered 2018-08-31: 10:00:00 via INTRAVENOUS
  Filled 2018-08-31: qty 250

## 2018-08-31 MED ORDER — PALONOSETRON HCL INJECTION 0.25 MG/5ML
0.2500 mg | Freq: Once | INTRAVENOUS | Status: AC
  Administered 2018-08-31: 0.25 mg via INTRAVENOUS
  Filled 2018-08-31: qty 5

## 2018-08-31 MED ORDER — HEPARIN SOD (PORK) LOCK FLUSH 100 UNIT/ML IV SOLN: 500.0000 [IU] | Freq: Once | INTRAVENOUS | Status: DC

## 2018-08-31 MED ORDER — SODIUM CHLORIDE 0.9 % IV SOLN
510.0000 mg | Freq: Once | INTRAVENOUS | Status: AC
  Administered 2018-08-31: 510 mg via INTRAVENOUS
  Filled 2018-08-31: qty 17

## 2018-08-31 MED ORDER — SODIUM CHLORIDE 0.9% FLUSH
10.0000 mL | INTRAVENOUS | Status: DC | PRN
  Administered 2018-08-31: 10 mL via INTRAVENOUS
  Filled 2018-08-31: qty 10

## 2018-08-31 MED ORDER — FLUOROURACIL CHEMO INJECTION 2.5 GM/50ML
400.0000 mg/m2 | Freq: Once | INTRAVENOUS | Status: AC
  Administered 2018-08-31: 700 mg via INTRAVENOUS
  Filled 2018-08-31: qty 14

## 2018-08-31 MED ORDER — DEXAMETHASONE SODIUM PHOSPHATE 10 MG/ML IJ SOLN
10.0000 mg | Freq: Once | INTRAMUSCULAR | Status: AC
  Administered 2018-08-31: 10 mg via INTRAVENOUS
  Filled 2018-08-31: qty 1

## 2018-08-31 NOTE — Patient Instructions (Signed)
Fluorouracil, 5-FU injection What is this medicine? FLUOROURACIL, 5-FU (flure oh YOOR a sil) is a chemotherapy drug. It slows the growth of cancer cells. This medicine is used to treat many types of cancer like breast cancer, colon or rectal cancer, pancreatic cancer, and stomach cancer. This medicine may be used for other purposes; ask your health care provider or pharmacist if you have questions. COMMON BRAND NAME(S): Adrucil What should I tell my health care provider before I take this medicine? They need to know if you have any of these conditions: -blood disorders -dihydropyrimidine dehydrogenase (DPD) deficiency -infection (especially a virus infection such as chickenpox, cold sores, or herpes) -kidney disease -liver disease -malnourished, poor nutrition -recent or ongoing radiation therapy -an unusual or allergic reaction to fluorouracil, other chemotherapy, other medicines, foods, dyes, or preservatives -pregnant or trying to get pregnant -breast-feeding How should I use this medicine? This drug is given as an infusion or injection into a vein. It is administered in a hospital or clinic by a specially trained health care professional. Talk to your pediatrician regarding the use of this medicine in children. Special care may be needed. Overdosage: If you think you have taken too much of this medicine contact a poison control center or emergency room at once. NOTE: This medicine is only for you. Do not share this medicine with others. What if I miss a dose? It is important not to miss your dose. Call your doctor or health care professional if you are unable to keep an appointment. What may interact with this medicine? -allopurinol -cimetidine -dapsone -digoxin -hydroxyurea -leucovorin -levamisole -medicines for seizures like ethotoin, fosphenytoin, phenytoin -medicines to increase blood counts like filgrastim, pegfilgrastim, sargramostim -medicines that treat or prevent blood  clots like warfarin, enoxaparin, and dalteparin -methotrexate -metronidazole -pyrimethamine -some other chemotherapy drugs like busulfan, cisplatin, estramustine, vinblastine -trimethoprim -trimetrexate -vaccines Talk to your doctor or health care professional before taking any of these medicines: -acetaminophen -aspirin -ibuprofen -ketoprofen -naproxen This list may not describe all possible interactions. Give your health care provider a list of all the medicines, herbs, non-prescription drugs, or dietary supplements you use. Also tell them if you smoke, drink alcohol, or use illegal drugs. Some items may interact with your medicine. What should I watch for while using this medicine? Visit your doctor for checks on your progress. This drug may make you feel generally unwell. This is not uncommon, as chemotherapy can affect healthy cells as well as cancer cells. Report any side effects. Continue your course of treatment even though you feel ill unless your doctor tells you to stop. In some cases, you may be given additional medicines to help with side effects. Follow all directions for their use. Call your doctor or health care professional for advice if you get a fever, chills or sore throat, or other symptoms of a cold or flu. Do not treat yourself. This drug decreases your body's ability to fight infections. Try to avoid being around people who are sick. This medicine may increase your risk to bruise or bleed. Call your doctor or health care professional if you notice any unusual bleeding. Be careful brushing and flossing your teeth or using a toothpick because you may get an infection or bleed more easily. If you have any dental work done, tell your dentist you are receiving this medicine. Avoid taking products that contain aspirin, acetaminophen, ibuprofen, naproxen, or ketoprofen unless instructed by your doctor. These medicines may hide a fever. Do not become pregnant while taking this  medicine. Women should inform their doctor if they wish to become pregnant or think they might be pregnant. There is a potential for serious side effects to an unborn child. Talk to your health care professional or pharmacist for more information. Do not breast-feed an infant while taking this medicine. Men should inform their doctor if they wish to father a child. This medicine may lower sperm counts. Do not treat diarrhea with over the counter products. Contact your doctor if you have diarrhea that lasts more than 2 days or if it is severe and watery. This medicine can make you more sensitive to the sun. Keep out of the sun. If you cannot avoid being in the sun, wear protective clothing and use sunscreen. Do not use sun lamps or tanning beds/booths. What side effects may I notice from receiving this medicine? Side effects that you should report to your doctor or health care professional as soon as possible: -allergic reactions like skin rash, itching or hives, swelling of the face, lips, or tongue -low blood counts - this medicine may decrease the number of white blood cells, red blood cells and platelets. You may be at increased risk for infections and bleeding. -signs of infection - fever or chills, cough, sore throat, pain or difficulty passing urine -signs of decreased platelets or bleeding - bruising, pinpoint red spots on the skin, black, tarry stools, blood in the urine -signs of decreased red blood cells - unusually weak or tired, fainting spells, lightheadedness -breathing problems -changes in vision -chest pain -mouth sores -nausea and vomiting -pain, swelling, redness at site where injected -pain, tingling, numbness in the hands or feet -redness, swelling, or sores on hands or feet -stomach pain -unusual bleeding Side effects that usually do not require medical attention (report to your doctor or health care professional if they continue or are bothersome): -changes in finger or  toe nails -diarrhea -dry or itchy skin -hair loss -headache -loss of appetite -sensitivity of eyes to the light -stomach upset -unusually teary eyes This list may not describe all possible side effects. Call your doctor for medical advice about side effects. You may report side effects to FDA at 1-800-FDA-1088. Where should I keep my medicine? This drug is given in a hospital or clinic and will not be stored at home. NOTE: This sheet is a summary. It may not cover all possible information. If you have questions about this medicine, talk to your doctor, pharmacist, or health care provider.  2018 [employer] Standard (2008-02-10 13:53:16) Leucovorin injection What is this medicine? LEUCOVORIN (loo koe VOR in) is used to prevent or treat the harmful effects of some medicines. This medicine is used to treat anemia caused by a low amount of folic acid in the body. It is also used with 5-fluorouracil (5-FU) to treat colon cancer. This medicine may be used for other purposes; ask your health care provider or pharmacist if you have questions. What should I tell my health care provider before I take this medicine? They need to know if you have any of these conditions: -anemia from low levels of vitamin B-12 in the blood -an unusual or allergic reaction to leucovorin, folic acid, other medicines, foods, dyes, or preservatives -pregnant or trying to get pregnant -breast-feeding How should I use this medicine? This medicine is for injection into a muscle or into a vein. It is given by a health care professional in a hospital or clinic setting. Talk to your pediatrician regarding the use of this medicine in   children. Special care may be needed. Overdosage: If you think you have taken too much of this medicine contact a poison control center or emergency room at once. NOTE: This medicine is only for you. Do not share this medicine with others. What if I miss a dose? This does not apply. What may  interact with this medicine? -capecitabine -fluorouracil -phenobarbital -phenytoin -primidone -trimethoprim-sulfamethoxazole This list may not describe all possible interactions. Give your health care provider a list of all the medicines, herbs, non-prescription drugs, or dietary supplements you use. Also tell them if you smoke, drink alcohol, or use illegal drugs. Some items may interact with your medicine. What should I watch for while using this medicine? Your condition will be monitored carefully while you are receiving this medicine. This medicine may increase the side effects of 5-fluorouracil, 5-FU. Tell your doctor or health care professional if you have diarrhea or mouth sores that do not get better or that get worse. What side effects may I notice from receiving this medicine? Side effects that you should report to your doctor or health care professional as soon as possible: -allergic reactions like skin rash, itching or hives, swelling of the face, lips, or tongue -breathing problems -fever, infection -mouth sores -unusual bleeding or bruising -unusually weak or tired Side effects that usually do not require medical attention (report to your doctor or health care professional if they continue or are bothersome): -constipation or diarrhea -loss of appetite -nausea, vomiting This list may not describe all possible side effects. Call your doctor for medical advice about side effects. You may report side effects to FDA at 1-800-FDA-1088. Where should I keep my medicine? This drug is given in a hospital or clinic and will not be stored at home. NOTE: This sheet is a summary. It may not cover all possible information. If you have questions about this medicine, talk to your doctor, pharmacist, or health care provider.  2018 [employer] Standard (2008-04-12 16:50:29) Ferumoxytol injection What is this medicine? FERUMOXYTOL is an iron complex. Iron is used to make healthy red blood  cells, which carry oxygen and nutrients throughout the body. This medicine is used to treat iron deficiency anemia in people with chronic kidney disease. This medicine may be used for other purposes; ask your health care provider or pharmacist if you have questions. COMMON BRAND NAME(S): Feraheme What should I tell my health care provider before I take this medicine? They need to know if you have any of these conditions: -anemia not caused by low iron levels -high levels of iron in the blood -magnetic resonance imaging (MRI) test scheduled -an unusual or allergic reaction to iron, other medicines, foods, dyes, or preservatives -pregnant or trying to get pregnant -breast-feeding How should I use this medicine? This medicine is for injection into a vein. It is given by a health care professional in a hospital or clinic setting. Talk to your pediatrician regarding the use of this medicine in children. Special care may be needed. Overdosage: If you think you have taken too much of this medicine contact a poison control center or emergency room at once. NOTE: This medicine is only for you. Do not share this medicine with others. What if I miss a dose? It is important not to miss your dose. Call your doctor or health care professional if you are unable to keep an appointment. What may interact with this medicine? This medicine may interact with the following medications: -other iron products This list may not describe all  possible interactions. Give your health care provider a list of all the medicines, herbs, non-prescription drugs, or dietary supplements you use. Also tell them if you smoke, drink alcohol, or use illegal drugs. Some items may interact with your medicine. What should I watch for while using this medicine? Visit your doctor or healthcare professional regularly. Tell your doctor or healthcare professional if your symptoms do not start to get better or if they get worse. You may need  blood work done while you are taking this medicine. You may need to follow a special diet. Talk to your doctor. Foods that contain iron include: whole grains/cereals, dried fruits, beans, or peas, leafy green vegetables, and organ meats (liver, kidney). What side effects may I notice from receiving this medicine? Side effects that you should report to your doctor or health care professional as soon as possible: -allergic reactions like skin rash, itching or hives, swelling of the face, lips, or tongue -breathing problems -changes in blood pressure -feeling faint or lightheaded, falls -fever or chills -flushing, sweating, or hot feelings -swelling of the ankles or feet Side effects that usually do not require medical attention (report to your doctor or health care professional if they continue or are bothersome): -diarrhea -headache -nausea, vomiting -stomach pain This list may not describe all possible side effects. Call your doctor for medical advice about side effects. You may report side effects to FDA at 1-800-FDA-1088. Where should I keep my medicine? This drug is given in a hospital or clinic and will not be stored at home. NOTE: This sheet is a summary. It may not cover all possible information. If you have questions about this medicine, talk to your doctor, pharmacist, or health care provider.  2018 [employer] Standard (2015-11-09 12:41:49) Oxaliplatin Injection What is this medicine? OXALIPLATIN (ox AL i PLA tin) is a chemotherapy drug. It targets fast dividing cells, like cancer cells, and causes these cells to die. This medicine is used to treat cancers of the colon and rectum, and many other cancers. This medicine may be used for other purposes; ask your health care provider or pharmacist if you have questions. COMMON BRAND NAME(S): Eloxatin What should I tell my health care provider before I take this medicine? They need to know if you have any of these  conditions: -kidney disease -an unusual or allergic reaction to oxaliplatin, other chemotherapy, other medicines, foods, dyes, or preservatives -pregnant or trying to get pregnant -breast-feeding How should I use this medicine? This drug is given as an infusion into a vein. It is administered in a hospital or clinic by a specially trained health care professional. Talk to your pediatrician regarding the use of this medicine in children. Special care may be needed. Overdosage: If you think you have taken too much of this medicine contact a poison control center or emergency room at once. NOTE: This medicine is only for you. Do not share this medicine with others. What if I miss a dose? It is important not to miss a dose. Call your doctor or health care professional if you are unable to keep an appointment. What may interact with this medicine? -medicines to increase blood counts like filgrastim, pegfilgrastim, sargramostim -probenecid -some antibiotics like amikacin, gentamicin, neomycin, polymyxin B, streptomycin, tobramycin -zalcitabine Talk to your doctor or health care professional before taking any of these medicines: -acetaminophen -aspirin -ibuprofen -ketoprofen -naproxen This list may not describe all possible interactions. Give your health care provider a list of all the medicines, herbs, non-prescription drugs,  or dietary supplements you use. Also tell them if you smoke, drink alcohol, or use illegal drugs. Some items may interact with your medicine. What should I watch for while using this medicine? Your condition will be monitored carefully while you are receiving this medicine. You will need important blood work done while you are taking this medicine. This medicine can make you more sensitive to cold. Do not drink cold drinks or use ice. Cover exposed skin before coming in contact with cold temperatures or cold objects. When out in cold weather wear warm clothing and cover your  mouth and nose to warm the air that goes into your lungs. Tell your doctor if you get sensitive to the cold. This drug may make you feel generally unwell. This is not uncommon, as chemotherapy can affect healthy cells as well as cancer cells. Report any side effects. Continue your course of treatment even though you feel ill unless your doctor tells you to stop. In some cases, you may be given additional medicines to help with side effects. Follow all directions for their use. Call your doctor or health care professional for advice if you get a fever, chills or sore throat, or other symptoms of a cold or flu. Do not treat yourself. This drug decreases your body's ability to fight infections. Try to avoid being around people who are sick. This medicine may increase your risk to bruise or bleed. Call your doctor or health care professional if you notice any unusual bleeding. Be careful brushing and flossing your teeth or using a toothpick because you may get an infection or bleed more easily. If you have any dental work done, tell your dentist you are receiving this medicine. Avoid taking products that contain aspirin, acetaminophen, ibuprofen, naproxen, or ketoprofen unless instructed by your doctor. These medicines may hide a fever. Do not become pregnant while taking this medicine. Women should inform their doctor if they wish to become pregnant or think they might be pregnant. There is a potential for serious side effects to an unborn child. Talk to your health care professional or pharmacist for more information. Do not breast-feed an infant while taking this medicine. Call your doctor or health care professional if you get diarrhea. Do not treat yourself. What side effects may I notice from receiving this medicine? Side effects that you should report to your doctor or health care professional as soon as possible: -allergic reactions like skin rash, itching or hives, swelling of the face, lips, or  tongue -low blood counts - This drug may decrease the number of white blood cells, red blood cells and platelets. You may be at increased risk for infections and bleeding. -signs of infection - fever or chills, cough, sore throat, pain or difficulty passing urine -signs of decreased platelets or bleeding - bruising, pinpoint red spots on the skin, black, tarry stools, nosebleeds -signs of decreased red blood cells - unusually weak or tired, fainting spells, lightheadedness -breathing problems -chest pain, pressure -cough -diarrhea -jaw tightness -mouth sores -nausea and vomiting -pain, swelling, redness or irritation at the injection site -pain, tingling, numbness in the hands or feet -problems with balance, talking, walking -redness, blistering, peeling or loosening of the skin, including inside the mouth -trouble passing urine or change in the amount of urine Side effects that usually do not require medical attention (report to your doctor or health care professional if they continue or are bothersome): -changes in vision -constipation -hair loss -loss of appetite -metallic taste in the  mouth or changes in taste -stomach pain This list may not describe all possible side effects. Call your doctor for medical advice about side effects. You may report side effects to FDA at 1-800-FDA-1088. Where should I keep my medicine? This drug is given in a hospital or clinic and will not be stored at home. NOTE: This sheet is a summary. It may not cover all possible information. If you have questions about this medicine, talk to your doctor, pharmacist, or health care provider.  2018 [employer] Standard (2008-05-03 17:22:47)

## 2018-08-31 NOTE — Progress Notes (Signed)
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 She was recently found to have iron deficiency anemia which did not get better despite taking iron supplements. She underwent colonoscopy by Dr. Alice Reichert in August 2019 which showed a partially obstructing mass in the distal ascending colon. This was biopsied and was consistent with moderately differentiated adenocarcinoma. Patient also had a CT abdomen and pelvis with contrast in August 2019 which showed a 3.2 cm constricting apple core type ascending colon cancer near the hepatic flexure. Small pericolonic lymph nodes but no overt adenopathy. 3.5 mm left hepatic lobe lesion which was eventually characterized as hemangioma on MRI. CT chest showed small subcentimeter pulmonary nodules which were nonspecific.  Patient underwent right hemicolectomy on 07/06/2018 which showed moderately differentiated adenocarcinoma with invasion through the visceral peritoneum. Metastatic carcinoma in 1 of 44 regional lymph nodes. Margins were negative. No lymphovascular invasion or perineural invasion was identified. No tumor deposits identified. pT4a pN1aMSI stable.  Cycle 1 of adjuvant FOLFOX chemotherapy started on 08/03/2018  Interval history-she had mouth sores 10 days ago which did not get better with Magic mouthwash.  She has been using salt water rinses and  sores have improved since then.  Constipation is under control with MiraLAX and senna.  She has mild tingling numbness in her toes intermittently which has not worsened.  Denies any nausea vomiting.  Appetite is stable  ECOG PS- 1 Pain scale- 0 Opioid associated constipation- no  Review of systems- Review of Systems  Constitutional: Negative for chills, fever, malaise/fatigue and weight loss.  HENT: Negative for congestion, ear discharge and nosebleeds.   Eyes: Negative for blurred vision.  Respiratory: Negative for cough, hemoptysis, sputum production, shortness of breath and wheezing.   Cardiovascular: Negative for chest pain, palpitations, orthopnea and claudication.  Gastrointestinal: Negative for abdominal pain, blood in stool, constipation, diarrhea, heartburn, melena, nausea and vomiting.  Genitourinary: Negative for dysuria, flank pain, frequency, hematuria and urgency.  Musculoskeletal: Negative for back pain, joint pain and myalgias.  Skin: Negative for rash.  Neurological: Negative for dizziness, tingling, focal weakness, seizures, weakness and headaches.  Endo/Heme/Allergies: Does not bruise/bleed easily.  Psychiatric/Behavioral: Negative for depression and suicidal ideas. The patient does not have insomnia.       Allergies  Allergen Reactions  . Biaxin [Clarithromycin] Shortness Of Breath  . Levofloxacin     Muscle pain  . Penicillins Rash    Has patient had a PCN reaction causing immediate rash, facial/tongue/throat swelling, SOB or lightheadedness with hypotension: no Has patient had a PCN reaction causing severe rash involving mucus membranes or skin necrosis: no Has patient had a PCN reaction that required hospitalization: no Has patient had a PCN reaction occurring within the last 10 years: no If all of the above answers are "NO", then may proceed with Cephalosporin use.      Past Medical History:  Diagnosis Date  . Arthritis    hips  . B12 deficiency   .  Colon  cancer ([place]) 07/06/2018  . 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 COPD (chronic obstructive pulmonary disease) ([place])   . Fibrocystic breast disease   . Mediastinal mass    removed 1/16  . Motion sickness    fair rides  . Osteoporosis   . Schizophrenia ([place])   . Vitamin D deficiency   . Wears dentures    partial upper and lower     Past Surgical History:  Procedure Laterality Date  . BREAST BIOPSY Left    neg  . BREAST BIOPSY Right 05/04/13   [place] bx/clip-neg  . COLONOSCOPY    . COLONOSCOPY WITH PROPOFOL N/A 06/10/2018   Procedure: COLONOSCOPY WITH PROPOFOL;  Surgeon: Toledo, [name], MD;  Location: [facility];  Service: Gastroenterology;  Laterality: N/A;  . ESOPHAGOGASTRODUODENOSCOPY N/A 06/10/2018   Procedure: ESOPHAGOGASTRODUODENOSCOPY (EGD);  Surgeon: Toledo, [name], MD;  Location: [facility];  Service: Gastroenterology;  Laterality: N/A;  . HALLUX VALGUS AKIN Right 11/20/2016   Procedure: HALLUX VALGUS AKIN;  Surgeon: Samara Deist, DPM;  Location: [place];  Service: Podiatry;  Laterality: Right;  . HALLUX VALGUS AUSTIN Right 11/20/2016   Procedure: HALLUX VALGUS AUSTIN  Alroy Dust) right;  Surgeon: Samara Deist, DPM;  Location: [place];  Service: Podiatry;  Laterality: Right;  IVA with Popliteal  . HALLUX VALGUS AUSTIN Left 01/15/2017   Procedure: HALLUX VALGUS AUSTIN  Correction left foot  Iva Popiteal;  Surgeon: Samara Deist, DPM;  Location: [place];  Service: Podiatry;  Laterality: Left;  IVA Popliteal   . LAPAROSCOPIC RIGHT COLECTOMY N/A 07/06/2018   Procedure: LAPAROSCOPIC COLECTOMY;  Surgeon: Benjamine Sprague, DO;  Location: [facility];  Service: General;  Laterality: N/A;  . MEDIASTINAL MASS EXCISION Left 11/09/2014   Dr. Wynelle [place], [place]  . PORTACATH PLACEMENT Right 07/30/2018   Procedure: INSERTION PORT-A-CATH;  Surgeon: Benjamine Sprague, DO;  Location: [facility];  Service: General;  Laterality: Right;  . THORACOSCOPY Left 11/09/2014    with excision mediastinal mass  . TUBAL LIGATION      Social History   Socioeconomic History  . Marital status: Widowed    Spouse name: Not on file  . Number of children: Not on file  . Years of education: Not on file  . Highest education level: Not on file  Occupational History  . Not on file  Social Needs  . Financial resource strain: Not on file  . Food insecurity:    Worry: Not on file    Inability: Not on file  . Transportation needs:    Medical: Not on file    Non-medical: Not on file  Tobacco Use  . Smoking status: Former Smoker    Last attempt to quit: 10/18/2014    Years since quitting: 3.8  . Smokeless tobacco: Never Used  Substance and Sexual Activity  . Alcohol use: No  . Drug use: Never  . Sexual activity: Not on file  Lifestyle  . Physical activity:    Days per week: Not on file    Minutes per session: Not on file  . Stress: Not on file  Relationships  . Social connections:    Talks on phone: Not on file    Gets together: Not on file    Attends religious service: Not on file    Active member of club or organization: Not on file    Attends meetings of clubs or organizations: Not on file    Relationship status: Not on file  . Intimate partner violence:  Fear of current or ex partner: Not on file    Emotionally abused: Not on file    Physically abused: Not on file    Forced sexual activity: Not on file  Other Topics Concern  . Not on file  Social History Narrative  . Not on file    Family History  Problem Relation Age of Onset  . Cancer Father   . Breast cancer Neg Hx      Current Outpatient Medications:  .  ASPIRIN 81 PO, Take 81 mg by mouth daily. , Disp: , Rfl:  .  Cholecalciferol (VITAMIN D3) 2000 units TABS, Take 2,000 Units by mouth daily. , Disp: , Rfl:  .  conjugated estrogens (PREMARIN) vaginal cream, Place 1 Applicatorful vaginally 2 (two) times a week. , Disp: , Rfl:  .  dexamethasone (DECADRON) 4 MG tablet, Take 2 tablets  (8 mg total) by mouth daily. Start the day after chemotherapy for 2 days. Take with food., Disp: 30 tablet, Rfl: 1 .  ibuprofen (ADVIL,MOTRIN) 800 MG tablet, Take 1 tablet (800 mg total) by mouth every 8 (eight) hours as needed for mild pain or moderate pain., Disp: 30 tablet, Rfl: 0 .  lidocaine-prilocaine (EMLA) cream, Apply to affected area once, Disp: 30 g, Rfl: 3 .  PROCYCLIDINE HCL PO, Take 5 mg by mouth 3 (three) times daily. (Kemadrine) , Disp: , Rfl:  .  thiothixene (NAVANE) 5 MG capsule, Take 10 mg by mouth at bedtime. , Disp: , Rfl:  .  vitamin B-12 (CYANOCOBALAMIN) 1000 MCG tablet, Take 1,000 mcg by mouth daily. , Disp: , Rfl:  .  LORazepam (ATIVAN) 0.5 MG tablet, Take 1 tablet (0.5 mg total) by mouth every 6 (six) hours as needed (Nausea or vomiting). (Patient not taking: Reported on 07/30/2018), Disp: 30 tablet, Rfl: 0 .  magic mouthwash w/lidocaine SOLN, Take 10 mLs by mouth 4 (four) times daily. (Patient not taking: Reported on 08/31/2018), Disp: 480 mL, Rfl: 3 .  ondansetron (ZOFRAN) 8 MG tablet, Take 1 tablet (8 mg total) by mouth 2 (two) times daily as needed for refractory nausea / vomiting. Start on day 3 after chemotherapy. (Patient not taking: Reported on 07/30/2018), Disp: 30 tablet, Rfl: 1 .  prochlorperazine (COMPAZINE) 10 MG tablet, Take 1 tablet (10 mg total) by mouth every 6 (six) hours as needed (Nausea or vomiting). (Patient not taking: Reported on 08/17/2018), Disp: 30 tablet, Rfl: 1 No current facility-administered medications for this visit.   Facility-Administered Medications Ordered in Other Visits:  .  heparin lock flush 100 unit/mL, 500 Units, Intravenous, Once, Randa Evens C, MD .  sodium chloride flush (NS) 0.9 % injection 10 mL, 10 mL, Intravenous, PRN, Sindy Guadeloupe, MD, 10 mL at 08/31/18 0859  Physical exam:  Vitals:   08/31/18 0921  BP: 118/76  Pulse: 67  Resp: 18  Temp: (!) 97 F (36.1 C)  TempSrc: Oral  Weight: 150 lb 5.7 oz (68.2 kg)  Height:  _0  (1.626 m)   Physical Exam  Constitutional: She is oriented to person, place, and time. She appears well-developed and well-nourished.  HENT:  Head: Normocephalic and atraumatic.  Mouth/Throat: Oropharynx is clear and moist.  Eyes: Pupils are equal, round, and reactive to light. EOM are normal.  Neck: Normal range of motion.  Cardiovascular: Normal rate, regular rhythm and normal heart sounds.  Pulmonary/Chest: Effort normal and breath sounds normal.  Abdominal: Soft. Bowel sounds are normal.  Neurological: She is alert and oriented to person,  place, and time.  Skin: Skin is warm and dry.     CMP Latest Ref Rng & Units 08/31/2018  Glucose 70 - 99 mg/dL 101(H)  BUN 8 - 23 mg/dL 15  Creatinine 0.44 - 1.00 mg/dL 0.64  Sodium 135 - 145 mmol/L 140  Potassium 3.5 - 5.1 mmol/L 3.9  Chloride 98 - 111 mmol/L 106  CO2 22 - 32 mmol/L 24  Calcium 8.9 - 10.3 mg/dL 9.1  Total Protein 6.5 - 8.1 g/dL 6.6  Total Bilirubin 0.3 - 1.2 mg/dL 0.6  Alkaline Phos 38 - 126 U/L 76  AST 15 - 41 U/L 19  ALT 0 - 44 U/L 12   CBC Latest Ref Rng & Units 08/31/2018  WBC 4.0 - 10.5 K/uL 4.7  Hemoglobin 12.0 - 15.0 g/dL 11.8(L)  Hematocrit 36.0 - 46.0 % 36.7  Platelets 150 - 400 K/uL 173      Assessment and plan- Patient is a 72 y.o. female with h/ostage IIIb adenocarcinoma of the colonpT4apN1a cM0status post hemicolectomy.  Here for on treatment assessment prior to cycle 3 of adjuvant FOLFOX chemotherapy  Counts okay to proceed with cycle 3 of adjuvant FOLFOX chemotherapy today.  Her ANC is 2.3 and I will consider adding Neulasta with cycle 4 onwards.  She will come back on day 3 for pump disconnect and I will see him back in 2 weeks with CBC and CMP prior to cycle 4 of adjuvant FOLFOX chemotherapy.  Iron deficiency anemia: She is status post 1 dose of Feraheme and will get her second dose today   Visit Diagnosis 1. Malignant neoplasm of ascending colon ([place])   2. Encounter for antineoplastic  chemotherapy   3. Iron deficiency anemia, unspecified iron deficiency anemia type      Dr. Randa Evens, MD, MPH [facility] [phone] 08/31/2018 9:38 AM

## 2018-08-31 NOTE — Progress Notes (Signed)
She has sore in right nostril that is still there from after her 1st chemo, she has mouth sores this time and the MMW was not helping but sores are better. She manages her constipation

## 2018-09-02 ENCOUNTER — Inpatient Hospital Stay: Payer: Medicare Other

## 2018-09-02 VITALS — BP 126/77 | HR 76 | Temp 97.6°F | Resp 18

## 2018-09-02 DIAGNOSIS — C183 Malignant neoplasm of hepatic flexure: Secondary | ICD-10-CM

## 2018-09-02 DIAGNOSIS — Z5111 Encounter for antineoplastic chemotherapy: Secondary | ICD-10-CM | POA: Diagnosis not present

## 2018-09-02 MED ORDER — SODIUM CHLORIDE 0.9% FLUSH
10.0000 mL | INTRAVENOUS | Status: DC | PRN
  Administered 2018-09-02: 10 mL
  Filled 2018-09-02: qty 10

## 2018-09-02 MED ORDER — HEPARIN SOD (PORK) LOCK FLUSH 100 UNIT/ML IV SOLN
500.0000 [IU] | Freq: Once | INTRAVENOUS | Status: AC | PRN
  Administered 2018-09-02: 500 [IU]

## 2018-09-02 MED ORDER — HEPARIN SOD (PORK) LOCK FLUSH 100 UNIT/ML IV SOLN
INTRAVENOUS | Status: AC
  Filled 2018-09-02: qty 5

## 2018-09-02 NOTE — Progress Notes (Signed)
Patient came to have her pump removed today and we noticed that her 5FU had not infused.  Patient said that the pump had beeped every five minutes.  Pharmacy said that the medication is good for 7 days and advised to hook the infusion back up.  Dr. Janese Banks notified and advised the same.  The pharmacy is using a new phaseal connector that makes the bag long and the bag has to be folded in half to fit into the pouch.  Pharmacy is discussing how to prevent this from happening again.  Patient is to return Friday to have pump disconnected.

## 2018-09-02 NOTE — Patient Instructions (Signed)
Patient instructed where to check the continuous infusion pump to make sure that it is infusing and verbalized understanding.

## 2018-09-14 ENCOUNTER — Inpatient Hospital Stay (HOSPITAL_BASED_OUTPATIENT_CLINIC_OR_DEPARTMENT_OTHER): Payer: Medicare Other | Admitting: Oncology

## 2018-09-14 ENCOUNTER — Inpatient Hospital Stay: Payer: Medicare Other

## 2018-09-14 ENCOUNTER — Other Ambulatory Visit: Payer: Self-pay

## 2018-09-14 VITALS — BP 100/62 | HR 82 | Temp 96.0°F | Resp 18 | Wt 152.0 lb

## 2018-09-14 VITALS — BP 132/80 | HR 62 | Resp 18

## 2018-09-14 DIAGNOSIS — D509 Iron deficiency anemia, unspecified: Secondary | ICD-10-CM

## 2018-09-14 DIAGNOSIS — C183 Malignant neoplasm of hepatic flexure: Secondary | ICD-10-CM

## 2018-09-14 DIAGNOSIS — Z79899 Other long term (current) drug therapy: Secondary | ICD-10-CM | POA: Diagnosis not present

## 2018-09-14 DIAGNOSIS — R200 Anesthesia of skin: Secondary | ICD-10-CM

## 2018-09-14 DIAGNOSIS — C182 Malignant neoplasm of ascending colon: Secondary | ICD-10-CM | POA: Diagnosis not present

## 2018-09-14 DIAGNOSIS — F209 Schizophrenia, unspecified: Secondary | ICD-10-CM

## 2018-09-14 DIAGNOSIS — K1370 Unspecified lesions of oral mucosa: Secondary | ICD-10-CM

## 2018-09-14 DIAGNOSIS — Z7982 Long term (current) use of aspirin: Secondary | ICD-10-CM

## 2018-09-14 DIAGNOSIS — E538 Deficiency of other specified B group vitamins: Secondary | ICD-10-CM

## 2018-09-14 DIAGNOSIS — Z5111 Encounter for antineoplastic chemotherapy: Secondary | ICD-10-CM | POA: Diagnosis not present

## 2018-09-14 DIAGNOSIS — M810 Age-related osteoporosis without current pathological fracture: Secondary | ICD-10-CM

## 2018-09-14 DIAGNOSIS — R202 Paresthesia of skin: Secondary | ICD-10-CM

## 2018-09-14 DIAGNOSIS — J449 Chronic obstructive pulmonary disease, unspecified: Secondary | ICD-10-CM

## 2018-09-14 DIAGNOSIS — K5900 Constipation, unspecified: Secondary | ICD-10-CM

## 2018-09-14 DIAGNOSIS — Z87891 Personal history of nicotine dependence: Secondary | ICD-10-CM

## 2018-09-14 DIAGNOSIS — M129 Arthropathy, unspecified: Secondary | ICD-10-CM

## 2018-09-14 DIAGNOSIS — E559 Vitamin D deficiency, unspecified: Secondary | ICD-10-CM

## 2018-09-14 LAB — COMPREHENSIVE METABOLIC PANEL
ALT: 19 U/L (ref 0–44)
ANION GAP: 8 (ref 5–15)
AST: 27 U/L (ref 15–41)
Albumin: 3.5 g/dL (ref 3.5–5.0)
Alkaline Phosphatase: 68 U/L (ref 38–126)
BUN: 16 mg/dL (ref 8–23)
CHLORIDE: 106 mmol/L (ref 98–111)
CO2: 26 mmol/L (ref 22–32)
Calcium: 8.9 mg/dL (ref 8.9–10.3)
Creatinine, Ser: 0.61 mg/dL (ref 0.44–1.00)
Glucose, Bld: 98 mg/dL (ref 70–99)
POTASSIUM: 3.5 mmol/L (ref 3.5–5.1)
Sodium: 140 mmol/L (ref 135–145)
Total Bilirubin: 0.7 mg/dL (ref 0.3–1.2)
Total Protein: 6.2 g/dL — ABNORMAL LOW (ref 6.5–8.1)

## 2018-09-14 LAB — CBC WITH DIFFERENTIAL/PLATELET
ABS IMMATURE GRANULOCYTES: 0.03 10*3/uL (ref 0.00–0.07)
Basophils Absolute: 0 10*3/uL (ref 0.0–0.1)
Basophils Relative: 0 %
EOS ABS: 0.4 10*3/uL (ref 0.0–0.5)
Eosinophils Relative: 6 %
HCT: 36.9 % (ref 36.0–46.0)
Hemoglobin: 12.2 g/dL (ref 12.0–15.0)
IMMATURE GRANULOCYTES: 0 %
Lymphocytes Relative: 24 %
Lymphs Abs: 1.7 10*3/uL (ref 0.7–4.0)
MCH: 29.9 pg (ref 26.0–34.0)
MCHC: 33.1 g/dL (ref 30.0–36.0)
MCV: 90.4 fL (ref 80.0–100.0)
Monocytes Absolute: 1.1 10*3/uL — ABNORMAL HIGH (ref 0.1–1.0)
Monocytes Relative: 16 %
NEUTROS ABS: 3.7 10*3/uL (ref 1.7–7.7)
NEUTROS PCT: 54 %
PLATELETS: 144 10*3/uL — AB (ref 150–400)
RBC: 4.08 MIL/uL (ref 3.87–5.11)
RDW: 17.8 % — AB (ref 11.5–15.5)
WBC: 7 10*3/uL (ref 4.0–10.5)
nRBC: 0 % (ref 0.0–0.2)

## 2018-09-14 MED ORDER — LEUCOVORIN CALCIUM INJECTION 350 MG
700.0000 mg | Freq: Once | INTRAVENOUS | Status: AC
  Administered 2018-09-14: 700 mg via INTRAVENOUS
  Filled 2018-09-14: qty 35

## 2018-09-14 MED ORDER — FLUOROURACIL CHEMO INJECTION 2.5 GM/50ML
400.0000 mg/m2 | Freq: Once | INTRAVENOUS | Status: AC
  Administered 2018-09-14: 700 mg via INTRAVENOUS
  Filled 2018-09-14: qty 14

## 2018-09-14 MED ORDER — PALONOSETRON HCL INJECTION 0.25 MG/5ML
0.2500 mg | Freq: Once | INTRAVENOUS | Status: AC
  Administered 2018-09-14: 0.25 mg via INTRAVENOUS
  Filled 2018-09-14: qty 5

## 2018-09-14 MED ORDER — OXALIPLATIN CHEMO INJECTION 100 MG/20ML
65.0000 mg/m2 | Freq: Once | INTRAVENOUS | Status: AC
  Administered 2018-09-14: 115 mg via INTRAVENOUS
  Filled 2018-09-14: qty 20

## 2018-09-14 MED ORDER — SODIUM CHLORIDE 0.9 % IV SOLN
2400.0000 mg/m2 | INTRAVENOUS | Status: DC
  Administered 2018-09-14: 4300 mg via INTRAVENOUS
  Filled 2018-09-14: qty 86

## 2018-09-14 MED ORDER — SODIUM CHLORIDE 0.9% FLUSH
10.0000 mL | INTRAVENOUS | Status: DC | PRN
  Administered 2018-09-14: 10 mL via INTRAVENOUS
  Filled 2018-09-14: qty 10

## 2018-09-14 MED ORDER — DEXAMETHASONE SODIUM PHOSPHATE 10 MG/ML IJ SOLN
10.0000 mg | Freq: Once | INTRAMUSCULAR | Status: AC
  Administered 2018-09-14: 10 mg via INTRAVENOUS
  Filled 2018-09-14: qty 1
  Filled 2018-09-14: qty 3

## 2018-09-14 MED ORDER — DEXTROSE 5 % IV SOLN
Freq: Once | INTRAVENOUS | Status: AC
  Administered 2018-09-14: 10:00:00 via INTRAVENOUS
  Filled 2018-09-14: qty 250

## 2018-09-14 NOTE — Patient Instructions (Signed)

## 2018-09-14 NOTE — Progress Notes (Signed)
Pt here for f/u. States "overall feeling good". No voiced complaints.

## 2018-09-14 NOTE — Progress Notes (Signed)
Patient reports that she had a problem with her mouth after the last treatment and used the prescribed mouthwash which improved the condition.  She also reports a slight nosebleed over the last week.

## 2018-09-15 ENCOUNTER — Encounter: Payer: Self-pay | Admitting: Oncology

## 2018-09-15 NOTE — Progress Notes (Signed)
Hematology/Oncology Consult note [facility]  [phone] [phone]  Patient Care Team: Clarisse Gouge, MD as PCP - General (Family Medicine) Clent Jacks, RN as Registered Nurse   Name of the patient: Kathy [name]  [id number]  Jan 14, 1946   Date of visit: 09/15/18  Diagnosis- Stage IIIb adenocarcinoma of the colonpT4apN1a cM0status post hemicolectomy  Chief complaint/ Reason for visit-on treatment assessment prior to cycle 4 of adjuvant FOLFOX chemotherapy  Heme/Onc history: patient is a 72 year old female with no significant medical history other than schizophrenia and osteoporosis. She was recently found to have iron deficiency anemia which did not get better despite taking iron supplements. She underwent colonoscopy by Dr. Alice Reichert in August 2019 which showed a partially obstructing mass in the distal ascending colon. This was biopsied and was consistent with moderately differentiated adenocarcinoma. Patient also had a CT abdomen and pelvis with contrast in August 2019 which showed a 3.2 cm constricting apple core type ascending colon cancer near the hepatic flexure. Small pericolonic lymph nodes but no overt adenopathy. 3.5 mm left hepatic lobe lesion which was eventually characterized as hemangioma on MRI. CT chest showed small subcentimeter pulmonary nodules which were nonspecific.  Patient underwent right hemicolectomy on 07/06/2018 which showed moderately differentiated adenocarcinoma with invasion through the visceral peritoneum. Metastatic carcinoma in 1 of 44 regional lymph nodes. Margins were negative. No lymphovascular invasion or perineural invasion was identified. No tumor deposits identified. pT4a pN1aMSI stable.Cycle 1 of adjuvant FOLFOX chemotherapy started on 08/03/2018  Interval history-she continues to have mild on and off mouth sores for which she is using Magic mouthwash as well as salt water rinses  with good control.  She also reports occasional periods of constipation for which she is using MiraLAX and senna.  She does not wish to add any further constipation meds at this time  ECOG PS- 1 Pain scale- 0 Opioid associated constipation- no  Review of systems- Review of Systems  HENT:       Mouth sores  Gastrointestinal: Positive for constipation.     Allergies  Allergen Reactions  . Biaxin [Clarithromycin] Shortness Of Breath  . Levofloxacin     Muscle pain  . Penicillins Rash    Has patient had a PCN reaction causing immediate rash, facial/tongue/throat swelling, SOB or lightheadedness with hypotension: no Has patient had a PCN reaction causing severe rash involving mucus membranes or skin necrosis: no Has patient had a PCN reaction that required hospitalization: no Has patient had a PCN reaction occurring within the last 10 years: no If all of the above answers are "NO", then may proceed with Cephalosporin use.      Past Medical History:  Diagnosis Date  . Arthritis    hips  . B12 deficiency   . Colon cancer ([place]) 07/06/2018  . 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 COPD (chronic obstructive pulmonary disease) ([place])   . Fibrocystic breast disease   . Mediastinal mass    removed 1/16  . Motion sickness    fair rides  . Osteoporosis   . Schizophrenia ([place])   . Vitamin D deficiency   . Wears dentures    partial upper and lower     Past Surgical History:  Procedure Laterality Date  . BREAST BIOPSY Left    neg  . BREAST BIOPSY Right 05/04/13   [place] bx/clip-neg  . COLONOSCOPY    . COLONOSCOPY WITH PROPOFOL N/A 06/10/2018   Procedure: COLONOSCOPY WITH PROPOFOL;  Surgeon:  Toledo, [name], MD;  Location: [facility];  Service: Gastroenterology;  Laterality: N/A;  . ESOPHAGOGASTRODUODENOSCOPY N/A 06/10/2018   Procedure: ESOPHAGOGASTRODUODENOSCOPY (EGD);  Surgeon: Toledo, [name], MD;  Location: [facility];  Service: Gastroenterology;  Laterality: N/A;  .  HALLUX VALGUS AKIN Right 11/20/2016   Procedure: HALLUX VALGUS AKIN;  Surgeon: Samara Deist, DPM;  Location: [place];  Service: Podiatry;  Laterality: Right;  . HALLUX VALGUS AUSTIN Right 11/20/2016   Procedure: HALLUX VALGUS AUSTIN  Alroy Dust) right;  Surgeon: Samara Deist, DPM;  Location: [place];  Service: Podiatry;  Laterality: Right;  IVA with Popliteal  . HALLUX VALGUS AUSTIN Left 01/15/2017   Procedure: HALLUX VALGUS AUSTIN  Correction left foot  Iva Popiteal;  Surgeon: Samara Deist, DPM;  Location: [place];  Service: Podiatry;  Laterality: Left;  IVA Popliteal   . LAPAROSCOPIC RIGHT COLECTOMY N/A 07/06/2018   Procedure: LAPAROSCOPIC COLECTOMY;  Surgeon: Benjamine Sprague, DO;  Location: [facility];  Service: General;  Laterality: N/A;  . MEDIASTINAL MASS EXCISION Left 11/09/2014   Dr. Wynelle [place], [place]  . PORTACATH PLACEMENT Right 07/30/2018   Procedure: INSERTION PORT-A-CATH;  Surgeon: Benjamine Sprague, DO;  Location: [facility];  Service: General;  Laterality: Right;  . THORACOSCOPY Left 11/09/2014   with excision mediastinal mass  . TUBAL LIGATION      Social History   Socioeconomic History  . Marital status: Widowed    Spouse name: Not on file  . Number of children: Not on file  . Years of education: Not on file  . Highest education level: Not on file  Occupational History  . Not on file  Social Needs  . Financial resource strain: Not on file  . Food insecurity:    Worry: Not on file    Inability: Not on file  . Transportation needs:    Medical: Not on file    Non-medical: Not on file  Tobacco Use  . Smoking status: Former Smoker    Last attempt to quit: 10/18/2014    Years since quitting: 3.9  . Smokeless tobacco: Never Used  Substance and Sexual Activity  . Alcohol use: No  . Drug use: Never  . Sexual activity: Not on file  Lifestyle  . Physical activity:    Days per week: Not on file    Minutes per session: Not on file  . Stress: Not on  file  Relationships  . Social connections:    Talks on phone: Not on file    Gets together: Not on file    Attends religious service: Not on file    Active member of club or organization: Not on file    Attends meetings of clubs or organizations: Not on file    Relationship status: Not on file  . Intimate partner violence:    Fear of current or ex partner: Not on file    Emotionally abused: Not on file    Physically abused: Not on file    Forced sexual activity: Not on file  Other Topics Concern  . Not on file  Social History Narrative  . Not on file    Family History  Problem Relation Age of Onset  . Cancer Father   . Breast cancer Neg Hx      Current Outpatient Medications:  .  ASPIRIN 81 PO, Take 81 mg by mouth daily. , Disp: , Rfl:  .  Cholecalciferol (VITAMIN D3) 2000 units TABS, Take 2,000 Units by mouth daily. , Disp: ,  Rfl:  .  conjugated estrogens (PREMARIN) vaginal cream, Place 1 Applicatorful vaginally 2 (two) times a week. , Disp: , Rfl:  .  dexamethasone (DECADRON) 4 MG tablet, Take 2 tablets (8 mg total) by mouth daily. Start the day after chemotherapy for 2 days. Take with food., Disp: 30 tablet, Rfl: 1 .  lidocaine-prilocaine (EMLA) cream, Apply to affected area once, Disp: 30 g, Rfl: 3 .  magic mouthwash w/lidocaine SOLN, Take 10 mLs by mouth 4 (four) times daily., Disp: 480 mL, Rfl: 3 .  polyethylene glycol (MIRALAX / GLYCOLAX) packet, Take 17 g by mouth daily., Disp: , Rfl:  .  PROCYCLIDINE HCL PO, Take 5 mg by mouth 3 (three) times daily. (Kemadrine) , Disp: , Rfl:  .  senna (SENOKOT) 8.6 MG tablet, Take 1 tablet by mouth daily., Disp: , Rfl:  .  thiothixene (NAVANE) 5 MG capsule, Take 10 mg by mouth at bedtime. , Disp: , Rfl:  .  vitamin B-12 (CYANOCOBALAMIN) 1000 MCG tablet, Take 1,000 mcg by mouth daily. , Disp: , Rfl:  .  ibuprofen (ADVIL,MOTRIN) 800 MG tablet, Take 1 tablet (800 mg total) by mouth every 8 (eight) hours as needed for mild pain or  moderate pain. (Patient not taking: Reported on 09/14/2018), Disp: 30 tablet, Rfl: 0 .  LORazepam (ATIVAN) 0.5 MG tablet, Take 1 tablet (0.5 mg total) by mouth every 6 (six) hours as needed (Nausea or vomiting). (Patient not taking: Reported on 07/30/2018), Disp: 30 tablet, Rfl: 0 .  ondansetron (ZOFRAN) 8 MG tablet, Take 1 tablet (8 mg total) by mouth 2 (two) times daily as needed for refractory nausea / vomiting. Start on day 3 after chemotherapy. (Patient not taking: Reported on 07/30/2018), Disp: 30 tablet, Rfl: 1 .  prochlorperazine (COMPAZINE) 10 MG tablet, Take 1 tablet (10 mg total) by mouth every 6 (six) hours as needed (Nausea or vomiting). (Patient not taking: Reported on 08/17/2018), Disp: 30 tablet, Rfl: 1  Physical exam:  Vitals:   09/14/18 0918  BP: 100/62  Pulse: 82  Resp: 18  Temp: (!) 96 F (35.6 C)  TempSrc: Tympanic  Weight: 152 lb 0.1 oz (68.9 kg)   Physical Exam  Constitutional: She is oriented to person, place, and time. She appears well-developed and well-nourished.  Elderly female in no acute distress  HENT:  Head: Normocephalic and atraumatic.  Mouth/Throat: Oropharynx is clear and moist.  Eyes: Pupils are equal, round, and reactive to light. EOM are normal.  Neck: Normal range of motion.  Cardiovascular: Normal rate, regular rhythm and normal heart sounds.  Pulmonary/Chest: Effort normal and breath sounds normal.  Abdominal: Soft. Bowel sounds are normal.  Neurological: She is alert and oriented to person, place, and time.  Skin: Skin is warm and dry.     CMP Latest Ref Rng & Units 09/14/2018  Glucose 70 - 99 mg/dL 98  BUN 8 - 23 mg/dL 16  Creatinine 0.44 - 1.00 mg/dL 0.61  Sodium 135 - 145 mmol/L 140  Potassium 3.5 - 5.1 mmol/L 3.5  Chloride 98 - 111 mmol/L 106  CO2 22 - 32 mmol/L 26  Calcium 8.9 - 10.3 mg/dL 8.9  Total Protein 6.5 - 8.1 g/dL 6.2(L)  Total Bilirubin 0.3 - 1.2 mg/dL 0.7  Alkaline Phos 38 - 126 U/L 68  AST 15 - 41 U/L 27  ALT 0 -  44 U/L 19   CBC Latest Ref Rng & Units 09/14/2018  WBC 4.0 - 10.5 K/uL 7.0  Hemoglobin 12.0 -  15.0 g/dL 12.2  Hematocrit 36.0 - 46.0 % 36.9  Platelets 150 - 400 K/uL 144(L)      Assessment and plan- Patient is a 72 y.o. female with h/ostage IIIb adenocarcinoma of the colonpT4apN1a cM0status post hemicolectomy.    She is here for on treatment assessment prior to cycle 4 of adjuvant FOLFOX chemotherapy  She is tolerating chemotherapy well so far without any significant side effects.  We are to continue oxaliplatin at a reduced dose of 65 mg/m.  She has not required any Neulasta on day 3 of pump disconnect so far.  She denies any peripheral neuropathy at this time.  She did have some evidence of iron deficiency and she is status post 2 doses of Feraheme.  I will see her back in 2 weeks time for cycle 5 of FOLFOX chemotherapy.  She will continue conservative measures for her mouth sores and constipation.  I do not notice any significant mouth sores on my exam today   Visit Diagnosis 1. Encounter for antineoplastic chemotherapy   2. Malignant neoplasm of ascending colon ([facility])      Dr. Randa Evens, MD, MPH [facility] [phone] 09/15/2018 8:58 AM

## 2018-09-16 ENCOUNTER — Inpatient Hospital Stay: Payer: Medicare Other

## 2018-09-16 VITALS — BP 116/74 | HR 91 | Temp 95.7°F | Resp 18

## 2018-09-16 DIAGNOSIS — Z5111 Encounter for antineoplastic chemotherapy: Secondary | ICD-10-CM | POA: Diagnosis not present

## 2018-09-16 DIAGNOSIS — C183 Malignant neoplasm of hepatic flexure: Secondary | ICD-10-CM

## 2018-09-16 MED ORDER — SODIUM CHLORIDE 0.9% FLUSH
10.0000 mL | INTRAVENOUS | Status: DC | PRN
  Administered 2018-09-16: 10 mL
  Filled 2018-09-16: qty 10

## 2018-09-16 MED ORDER — HEPARIN SOD (PORK) LOCK FLUSH 100 UNIT/ML IV SOLN
500.0000 [IU] | Freq: Once | INTRAVENOUS | Status: AC | PRN
  Administered 2018-09-16: 500 [IU]

## 2018-09-23 ENCOUNTER — Other Ambulatory Visit: Payer: Self-pay | Admitting: Oncology

## 2018-09-23 DIAGNOSIS — C183 Malignant neoplasm of hepatic flexure: Secondary | ICD-10-CM

## 2018-09-24 ENCOUNTER — Other Ambulatory Visit: Payer: Self-pay | Admitting: Oncology

## 2018-09-24 ENCOUNTER — Encounter: Payer: Self-pay | Admitting: Oncology

## 2018-09-24 ENCOUNTER — Ambulatory Visit: Payer: Medicare Other

## 2018-09-24 ENCOUNTER — Telehealth: Payer: Self-pay | Admitting: *Deleted

## 2018-09-24 ENCOUNTER — Inpatient Hospital Stay: Payer: Medicare Other | Attending: Oncology | Admitting: Oncology

## 2018-09-24 ENCOUNTER — Other Ambulatory Visit: Payer: Self-pay

## 2018-09-24 VITALS — BP 119/73 | Temp 97.0°F | Ht 64.0 in | Wt 148.0 lb

## 2018-09-24 DIAGNOSIS — D1803 Hemangioma of intra-abdominal structures: Secondary | ICD-10-CM | POA: Diagnosis not present

## 2018-09-24 DIAGNOSIS — E559 Vitamin D deficiency, unspecified: Secondary | ICD-10-CM | POA: Diagnosis not present

## 2018-09-24 DIAGNOSIS — D6959 Other secondary thrombocytopenia: Secondary | ICD-10-CM | POA: Diagnosis not present

## 2018-09-24 DIAGNOSIS — F209 Schizophrenia, unspecified: Secondary | ICD-10-CM | POA: Insufficient documentation

## 2018-09-24 DIAGNOSIS — R197 Diarrhea, unspecified: Secondary | ICD-10-CM | POA: Insufficient documentation

## 2018-09-24 DIAGNOSIS — R110 Nausea: Secondary | ICD-10-CM

## 2018-09-24 DIAGNOSIS — Z7982 Long term (current) use of aspirin: Secondary | ICD-10-CM | POA: Diagnosis not present

## 2018-09-24 DIAGNOSIS — J449 Chronic obstructive pulmonary disease, unspecified: Secondary | ICD-10-CM | POA: Diagnosis not present

## 2018-09-24 DIAGNOSIS — Z5189 Encounter for other specified aftercare: Secondary | ICD-10-CM

## 2018-09-24 DIAGNOSIS — M810 Age-related osteoporosis without current pathological fracture: Secondary | ICD-10-CM | POA: Insufficient documentation

## 2018-09-24 DIAGNOSIS — J069 Acute upper respiratory infection, unspecified: Secondary | ICD-10-CM | POA: Diagnosis not present

## 2018-09-24 DIAGNOSIS — Z95828 Presence of other vascular implants and grafts: Secondary | ICD-10-CM

## 2018-09-24 DIAGNOSIS — M549 Dorsalgia, unspecified: Secondary | ICD-10-CM | POA: Insufficient documentation

## 2018-09-24 DIAGNOSIS — Z5111 Encounter for antineoplastic chemotherapy: Secondary | ICD-10-CM | POA: Insufficient documentation

## 2018-09-24 DIAGNOSIS — Z87891 Personal history of nicotine dependence: Secondary | ICD-10-CM | POA: Insufficient documentation

## 2018-09-24 DIAGNOSIS — D6481 Anemia due to antineoplastic chemotherapy: Secondary | ICD-10-CM | POA: Insufficient documentation

## 2018-09-24 DIAGNOSIS — C182 Malignant neoplasm of ascending colon: Secondary | ICD-10-CM | POA: Diagnosis present

## 2018-09-24 DIAGNOSIS — K5900 Constipation, unspecified: Secondary | ICD-10-CM | POA: Diagnosis not present

## 2018-09-24 DIAGNOSIS — M129 Arthropathy, unspecified: Secondary | ICD-10-CM | POA: Insufficient documentation

## 2018-09-24 DIAGNOSIS — Z79899 Other long term (current) drug therapy: Secondary | ICD-10-CM | POA: Diagnosis not present

## 2018-09-24 DIAGNOSIS — E860 Dehydration: Secondary | ICD-10-CM

## 2018-09-24 DIAGNOSIS — N390 Urinary tract infection, site not specified: Secondary | ICD-10-CM | POA: Diagnosis not present

## 2018-09-24 DIAGNOSIS — T451X5A Adverse effect of antineoplastic and immunosuppressive drugs, initial encounter: Secondary | ICD-10-CM | POA: Insufficient documentation

## 2018-09-24 DIAGNOSIS — E876 Hypokalemia: Secondary | ICD-10-CM

## 2018-09-24 DIAGNOSIS — K521 Toxic gastroenteritis and colitis: Secondary | ICD-10-CM | POA: Insufficient documentation

## 2018-09-24 DIAGNOSIS — R5383 Other fatigue: Secondary | ICD-10-CM

## 2018-09-24 DIAGNOSIS — K1230 Oral mucositis (ulcerative), unspecified: Secondary | ICD-10-CM | POA: Diagnosis not present

## 2018-09-24 LAB — COMPREHENSIVE METABOLIC PANEL
ALT: 29 U/L (ref 0–44)
ANION GAP: 9 (ref 5–15)
AST: 19 U/L (ref 15–41)
Albumin: 2.6 g/dL — ABNORMAL LOW (ref 3.5–5.0)
Alkaline Phosphatase: 51 U/L (ref 38–126)
BUN: 21 mg/dL (ref 8–23)
CO2: 20 mmol/L — ABNORMAL LOW (ref 22–32)
Calcium: 8.2 mg/dL — ABNORMAL LOW (ref 8.9–10.3)
Chloride: 108 mmol/L (ref 98–111)
Creatinine, Ser: 0.75 mg/dL (ref 0.44–1.00)
GFR calc Af Amer: >60 mL/min (ref >60)
GFR calc non Af Amer: >60 mL/min (ref >60)
GLUCOSE: 115 mg/dL — AB (ref 70–99)
Potassium: 2.3 mmol/L — CL (ref 3.5–5.1)
Sodium: 137 mmol/L (ref 135–145)
Total Bilirubin: 0.7 mg/dL (ref 0.3–1.2)
Total Protein: 4.9 g/dL — ABNORMAL LOW (ref 6.5–8.1)

## 2018-09-24 LAB — CBC WITH DIFFERENTIAL/PLATELET
Abs Immature Granulocytes: 0.03 10*3/uL (ref 0.00–0.07)
Basophils Absolute: 0 10*3/uL (ref 0.0–0.1)
Basophils Relative: 1 %
Eosinophils Absolute: 0 10*3/uL (ref 0.0–0.5)
Eosinophils Relative: 0 %
HCT: 35 % — ABNORMAL LOW (ref 36.0–46.0)
Hemoglobin: 11.7 g/dL — ABNORMAL LOW (ref 12.0–15.0)
Immature Granulocytes: 1 %
LYMPHS ABS: 1 10*3/uL (ref 0.7–4.0)
Lymphocytes Relative: 30 %
MCH: 29.5 pg (ref 26.0–34.0)
MCHC: 33.4 g/dL (ref 30.0–36.0)
MCV: 88.4 fL (ref 80.0–100.0)
Monocytes Absolute: 1.1 10*3/uL — ABNORMAL HIGH (ref 0.1–1.0)
Monocytes Relative: 32 %
Neutro Abs: 1.2 10*3/uL — ABNORMAL LOW (ref 1.7–7.7)
Neutrophils Relative %: 36 %
Platelets: 148 10*3/uL — ABNORMAL LOW (ref 150–400)
RBC: 3.96 MIL/uL (ref 3.87–5.11)
RDW: 18.3 % — ABNORMAL HIGH (ref 11.5–15.5)
WBC: 3.3 10*3/uL — ABNORMAL LOW (ref 4.0–10.5)
nRBC: 0 % (ref 0.0–0.2)

## 2018-09-24 LAB — MAGNESIUM: Magnesium: 1.8 mg/dL (ref 1.7–2.4)

## 2018-09-24 MED ORDER — ONDANSETRON HCL 4 MG/2ML IJ SOLN
8.0000 mg | Freq: Once | INTRAMUSCULAR | Status: AC
  Administered 2018-09-24: 8 mg via INTRAVENOUS

## 2018-09-24 MED ORDER — SODIUM CHLORIDE 0.9 % IV SOLN: 8.0000 mg | Freq: Once | INTRAVENOUS | Status: DC

## 2018-09-24 MED ORDER — SODIUM CHLORIDE 0.9 % IV SOLN
INTRAVENOUS | Status: DC
  Filled 2018-09-24: qty 250

## 2018-09-24 MED ORDER — SODIUM CHLORIDE 0.9% FLUSH
10.0000 mL | INTRAVENOUS | Status: DC | PRN
  Administered 2018-09-24: 10 mL via INTRAVENOUS
  Filled 2018-09-24: qty 10

## 2018-09-24 MED ORDER — ONDANSETRON HCL 4 MG/2ML IJ SOLN
INTRAMUSCULAR | Status: AC
  Filled 2018-09-24: qty 4

## 2018-09-24 MED ORDER — NYSTATIN 100000 UNIT/ML MT SUSP: 5.0000 mL | Freq: Four times a day (QID) | OROMUCOSAL | 1 refills | Status: DC

## 2018-09-24 MED ORDER — HEPARIN SOD (PORK) LOCK FLUSH 100 UNIT/ML IV SOLN
500.0000 [IU] | Freq: Once | INTRAVENOUS | Status: AC
  Administered 2018-09-24: 500 [IU] via INTRAVENOUS

## 2018-09-24 MED ORDER — POTASSIUM CHLORIDE CRYS ER 10 MEQ PO TBCR
40.0000 meq | EXTENDED_RELEASE_TABLET | Freq: Once | ORAL | Status: AC
  Administered 2018-09-24: 40 meq via ORAL

## 2018-09-24 MED ORDER — SODIUM CHLORIDE 0.9 % IV SOLN: 10.0000 mg | Freq: Once | INTRAVENOUS | Status: DC

## 2018-09-24 MED ORDER — DEXAMETHASONE SODIUM PHOSPHATE 10 MG/ML IJ SOLN
10.0000 mg | Freq: Once | INTRAMUSCULAR | Status: AC
  Administered 2018-09-24: 10 mg via INTRAVENOUS
  Filled 2018-09-24: qty 1

## 2018-09-24 MED ORDER — SODIUM CHLORIDE 0.9 % IV SOLN
Freq: Once | INTRAVENOUS | Status: AC
  Administered 2018-09-24: 14:00:00 via INTRAVENOUS
  Filled 2018-09-24: qty 250

## 2018-09-24 MED ORDER — POTASSIUM CHLORIDE CRYS ER 20 MEQ PO TBCR: 40.0000 meq | EXTENDED_RELEASE_TABLET | Freq: Two times a day (BID) | ORAL | 0 refills | Status: DC

## 2018-09-24 NOTE — Progress Notes (Addendum)
Symptom Management Consult note [facility]  [phone] [phone]  Patient Care Team: Clarisse Gouge, MD as PCP - General (Family Medicine) Clent Jacks, RN as Registered Nurse   Name of the patient: Kathy [name]  [id number]  Jul 08, 1946   Date of visit: 09/24/2018  Diagnosis: Colon cancer  Chief Complaint: diarrhea  Current Treatment: s/p cycle 4 of adjuvant FOLFOX. Last given on 09/14/18  Oncology History: Patient last seen by primary oncologist Dr. Janese Banks on 09/14/2018 prior to cycle 4 of FOLFOX.  At that time, she continued to have mild mucositis.  Currently using Magic mouthwash as well as salt water rinses with good control.  Admitted to periods of constipation for which she was using MiraLAX and Senokot.  They proceeded with cycle 4 and she was scheduled to return to clinic in 2 weeks for consideration of cycle 5.    Colon cancer ([place])   07/06/2018 Initial Diagnosis    Colon cancer ([place])    07/23/2018 Cancer Staging    Staging form: Colon and Rectum, AJCC 8th Edition - Pathologic stage from 07/23/2018: Stage IIIB (pT4a, pN1a, cM0) - Signed by Sindy Guadeloupe, MD on 07/24/2018    08/02/2018 -  Chemotherapy    The patient had palonosetron (ALOXI) injection 0.25 mg, 0.25 mg, Intravenous,  Once, 4 of 12 cycles Administration: 0.25 mg (08/03/2018), 0.25 mg (08/17/2018), 0.25 mg (08/31/2018), 0.25 mg (09/14/2018) leucovorin 700 mg in dextrose 5 % 250 mL infusion, 716 mg, Intravenous,  Once, 4 of 12 cycles Administration: 700 mg (08/03/2018), 700 mg (08/17/2018), 700 mg (08/31/2018), 700 mg (09/14/2018) oxaliplatin (ELOXATIN) 115 mg in dextrose 5 % 500 mL chemo infusion, 65 mg/m2 = 115 mg (100 % of original dose 65 mg/m2), Intravenous,  Once, 4 of 12 cycles Dose modification: 65 mg/m2 (original dose 65 mg/m2, Cycle 1, Reason: Patient Age) Administration: 115 mg (08/03/2018), 115 mg (08/17/2018), 115 mg (08/31/2018), 115 mg  (09/14/2018) fluorouracil (ADRUCIL) chemo injection 700 mg, 400 mg/m2 = 700 mg, Intravenous,  Once, 4 of 12 cycles Administration: 700 mg (08/03/2018), 700 mg (08/17/2018), 700 mg (08/31/2018), 700 mg (09/14/2018) fluorouracil (ADRUCIL) 4,300 mg in sodium chloride 0.9 % 64 mL chemo infusion, 2,400 mg/m2 = 4,300 mg, Intravenous, 1 Day/Dose, 4 of 12 cycles Administration: 4,300 mg (08/03/2018), 4,300 mg (08/17/2018), 4,300 mg (08/31/2018), 4,300 mg (09/14/2018)  for chemotherapy treatment.      Subjective Data: ECOG: 1 - Symptomatic but completely ambulatory  Subjective:     BRIDGET [place] is a 72 y.o. female who presents for evaluation of diarrhea. Onset of diarrhea was 3 days ago. Diarrhea is occurring approximately 8 times per day. Patient describes diarrhea as watery. Diarrhea has been associated with abdominal pain described as aching and relieved with bowel movement. Patient denies fever, recent antibiotic use, recent travel. Previous visits for diarrhea: none. Evaluation to date: none.  Treatment to date: Immdium without relief.  The following portions of the patient's history were reviewed and updated as appropriate: allergies, current medications, past family history, past medical history, past social history, past surgical history and problem list.  Review of Systems A comprehensive review of systems was negative except for: Gastrointestinal: positive for diarrhea    Objective:    BP 119/73 (BP Location: Left Arm, Patient Position: Sitting) Comment: Recheck w/ standing: 110/76, Pt. denies any dizziness.   Temp (!) 97 F (36.1 C) (Tympanic)   Ht 5\' 4"  (1.626 m)   Wt 148 lb (67.1 kg)  BMI 25.40 kg/m  General: alert, fatigued and no distress  Hydration:  mildly dehydrated  Abdomen:    soft, non-tender; bowel sounds normal; no masses,  no organomegaly    Assessment:    Diarrhea of uncertain etiology, moderate in severity   Plan:    Appropriate educational material discussed  and distributed. Lab studies per orders. Medications per orders. OTC antidiarrheals may be used judiciously. Stool studies per orders.    Colon Cancer: S/p cycle 4 FOLFOX. Has tolerated well with mild diarrhea (1 day or so) after treatment Scheduled to RTC on 09/28/18 for Cycle 5 FOLFOX and 2 days later for pump.   Diarrhea: Uncertain etiology.  She denies abdominal pain or fevers.  Intermittently nauseated without vomiting.  States has had previous episodes of diarrhea after treatments lasting approximately 1 day.  Typically resolves on its own.  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  On assessment, white plaques visualized on tongue and bugle mucosa.  She denies dysphasia. Rx nystatin swish and swallow 4 times daily.  She is not currently using Magic mouthwash.  She uses it for several days after treatment only.  Plan 1 liter Nacl in clinic today.  Orthostatics negative.  Stat Labs: Revealed a potassium of 2.3.  Patient able to tolerate p.o.'s.  Consulted with Dr. Rogue Bussing, and given she is tolerating oral's she can be sent home with oral medication versus admission with IV potassium.  We will give 40 mEq of potassium while in clinic today. RX 40 mEq daily x7 days.  Labs will be rechecked on Monday prior to treatment.  We spoke at length about neutropenia and white blood cell and ANC are both low.  Encouraged her to stay away from large crowds.  If she develops a fever she is to call clinic ASAP. Stat stool sample to rule out infection and/or C. Difficile.  Unfortunately unable to produce specimen in clinic.  Collection container and bag sent home with patient and granddaughter.  Instructed to return as soon as possible.  Results: C. difficile antigen positive, toxin negative.  GI panel negative.  Waiting on C. difficile PCR.  We will continue to monitor for results of PCR.   Addendum: Spoke with patient and diarrhea is "a little  better".  Return stool sample this morning.  Gave her preliminary results.  She is okay to begin Lomotil at this time.  Will call patient with results from C. difficile PCR.  Greater than 50% was spent in counseling and coordination of care with this patient including but not limited to discussion of the relevant topics above (See A&P) including, but not limited to diagnosis and management of acute and chronic medical conditions.   Faythe Casa, NP 09/25/2018 3:03 PM

## 2018-09-24 NOTE — Telephone Encounter (Signed)
I have contacted daughter who is reluctant to accept appointment and then she called her mother who is not wanting to come to [facility] and is unable to make it from her living room to the bathroom without defecating on herself. I stressed the importance of her needing to be seen and she stated she would call her mother back

## 2018-09-24 NOTE — Progress Notes (Signed)
Patient has been having diarrhea x 3 days and watery stools 8-10 a day, she is taking imodium and drinking water but diarrhea kept coming so she stopped drinking water today. She has dizziness upon standing at time. Vs taken orthostatic and not much difference in values. She is not diizzy right now. She has been nauseated at times and taking compazine and it helps some. She is nauseated now and took last dose this am.she also notates that she has lethargy and fatigue. I explained that fatigue comes with chemo but patient usually walks a lot for exercise and these few days she has not felt like doing anything

## 2018-09-24 NOTE — Telephone Encounter (Signed)
yes

## 2018-09-24 NOTE — Telephone Encounter (Signed)
Erasmo Downer called back and states patient will come in today, but they cannot make appointment at 1130 because she has to go get patient an hour away then another half hour to get to [place], Appointment changed to 130

## 2018-09-24 NOTE — Patient Instructions (Signed)
It was great to meet you today.  Discharge instructions: 3 prescriptions have been sent to your pharmacy in [place].  They include: Nystatin swish and swallow, Lomotil and 40 mEq potassium tablets.  Please take 1 dose this evening of your potassium (40 meq) and start your twice daily dosing tomorrow.  Use your nystatin beginning this evening and continue for the next several days.  Please do not start taking Lomotil until we rule out infection.  Please return stool sample to [facility] ASAP.  Once we receive the results of your stool sample, we will let you know what the next steps are.   Please try and stay hydrated.  Below are some tips and tricks about diarrhea.  Dr. Janese Banks will recheck your labs on Monday prior to your next cycle of chemotherapy.    Diarrhea, Adult Diarrhea is when you have loose and water poop (stool) often. Diarrhea can make you feel weak and cause you to get dehydrated. Dehydration can make you tired and thirsty, make you have a dry mouth, and make it so you pee (urinate) less often. Diarrhea often lasts 2-3 days. However, it can last longer if it is a sign of something more serious. It is important to treat your diarrhea as told by your doctor. Follow these instructions at home: Eating and drinking  Follow these recommendations as told by your doctor:  Take an oral rehydration solution (ORS). This is a drink that is sold at pharmacies and stores.  Drink clear fluids, such as: ? Water. ? Ice chips. ? Diluted fruit juice. ? Low-calorie sports drinks.  Eat bland, easy-to-digest foods in small amounts as you are able. These foods include: ? Bananas. ? Applesauce. ? Rice. ? Low-fat (lean) meats. ? Toast. ? Crackers.  Avoid drinking fluids that have a lot of sugar or caffeine in them.  Avoid alcohol.  Avoid spicy or fatty foods.  General instructions   Drink enough fluid to keep your pee (urine) clear or pale yellow.  Wash your hands often. If you  cannot use soap and water, use hand sanitizer.  Make sure that all people in your home wash their hands well and often.  Take over-the-counter and prescription medicines only as told by your doctor.  Rest at home while you get better.  Watch your condition for any changes.  Take a warm bath to help with any burning or pain from having diarrhea.  Keep all follow-up visits as told by your doctor. This is important. Contact a doctor if:  You have a fever.  Your diarrhea gets worse.  You have new symptoms.  You cannot keep fluids down.  You feel light-headed or dizzy.  You have a headache.  You have muscle cramps. Get help right away if:  You have chest pain.  You feel very weak or you pass out (faint).  You have bloody or black poop or poop that look like tar.  You have very bad pain, cramping, or bloating in your belly (abdomen).  You have trouble breathing or you are breathing very quickly.  Your heart is beating very quickly.  Your skin feels cold and clammy.  You feel confused.  You have signs of dehydration, such as: ? Dark pee, hardly any pee, or no pee. ? Cracked lips. ? Dry mouth. ? Sunken eyes. ? Sleepiness. ? Weakness. This information is not intended to replace advice given to you by your health care provider. Make sure you discuss any questions you have  with your health care provider. Document Released: 03/25/2008 Document Revised: 04/26/2016 Document Reviewed: 06/13/2015 Elsevier Interactive Patient Education  2018 [employer].

## 2018-09-24 NOTE — Telephone Encounter (Signed)
She can be seen in symptom management clinic today for fluids and blood work

## 2018-09-24 NOTE — Telephone Encounter (Signed)
Daughter called reporting that patient is having side effects from her chemotherapy; severe diarrhea and Imodium is not helping, she is very fatigued and is having nausea. Please advise

## 2018-09-24 NOTE — Telephone Encounter (Signed)
   Ref Range & Units 14:34  Potassium 3.5 - 5.1 mmol/L 2.3Low Panic

## 2018-09-25 ENCOUNTER — Inpatient Hospital Stay: Payer: Medicare Other

## 2018-09-25 DIAGNOSIS — C182 Malignant neoplasm of ascending colon: Secondary | ICD-10-CM | POA: Diagnosis not present

## 2018-09-25 DIAGNOSIS — R197 Diarrhea, unspecified: Secondary | ICD-10-CM

## 2018-09-25 LAB — GASTROINTESTINAL PANEL BY PCR, STOOL (REPLACES STOOL CULTURE)

## 2018-09-25 LAB — C DIFFICILE QUICK SCREEN W PCR REFLEX
C DIFFICILE (CDIFF) TOXIN: NEGATIVE
C Diff antigen: POSITIVE — AB

## 2018-09-25 LAB — CLOSTRIDIUM DIFFICILE BY PCR, REFLEXED: Toxigenic C. Difficile by PCR: NEGATIVE

## 2018-09-25 MED ORDER — DIPHENOXYLATE-ATROPINE 2.5-0.025 MG PO TABS: 1.0000 | ORAL_TABLET | Freq: Four times a day (QID) | ORAL | 0 refills | Status: DC | PRN

## 2018-09-25 MED ORDER — POTASSIUM CHLORIDE CRYS ER 20 MEQ PO TBCR: 20.0000 meq | EXTENDED_RELEASE_TABLET | Freq: Two times a day (BID) | ORAL | Status: DC

## 2018-09-28 ENCOUNTER — Inpatient Hospital Stay (HOSPITAL_BASED_OUTPATIENT_CLINIC_OR_DEPARTMENT_OTHER): Payer: Medicare Other | Admitting: Oncology

## 2018-09-28 ENCOUNTER — Inpatient Hospital Stay: Payer: Medicare Other

## 2018-09-28 ENCOUNTER — Encounter: Payer: Self-pay | Admitting: Oncology

## 2018-09-28 VITALS — BP 110/74 | HR 84 | Temp 96.0°F | Resp 16 | Wt 148.5 lb

## 2018-09-28 DIAGNOSIS — K1230 Oral mucositis (ulcerative), unspecified: Secondary | ICD-10-CM

## 2018-09-28 DIAGNOSIS — Z5111 Encounter for antineoplastic chemotherapy: Secondary | ICD-10-CM

## 2018-09-28 DIAGNOSIS — C183 Malignant neoplasm of hepatic flexure: Secondary | ICD-10-CM

## 2018-09-28 DIAGNOSIS — M810 Age-related osteoporosis without current pathological fracture: Secondary | ICD-10-CM

## 2018-09-28 DIAGNOSIS — D701 Agranulocytosis secondary to cancer chemotherapy: Secondary | ICD-10-CM

## 2018-09-28 DIAGNOSIS — R197 Diarrhea, unspecified: Secondary | ICD-10-CM

## 2018-09-28 DIAGNOSIS — M129 Arthropathy, unspecified: Secondary | ICD-10-CM

## 2018-09-28 DIAGNOSIS — C182 Malignant neoplasm of ascending colon: Secondary | ICD-10-CM

## 2018-09-28 DIAGNOSIS — E559 Vitamin D deficiency, unspecified: Secondary | ICD-10-CM

## 2018-09-28 DIAGNOSIS — F209 Schizophrenia, unspecified: Secondary | ICD-10-CM

## 2018-09-28 DIAGNOSIS — Z79899 Other long term (current) drug therapy: Secondary | ICD-10-CM

## 2018-09-28 DIAGNOSIS — K521 Toxic gastroenteritis and colitis: Secondary | ICD-10-CM

## 2018-09-28 DIAGNOSIS — Z87891 Personal history of nicotine dependence: Secondary | ICD-10-CM

## 2018-09-28 DIAGNOSIS — J449 Chronic obstructive pulmonary disease, unspecified: Secondary | ICD-10-CM

## 2018-09-28 DIAGNOSIS — K5900 Constipation, unspecified: Secondary | ICD-10-CM

## 2018-09-28 DIAGNOSIS — Z7982 Long term (current) use of aspirin: Secondary | ICD-10-CM

## 2018-09-28 DIAGNOSIS — D509 Iron deficiency anemia, unspecified: Secondary | ICD-10-CM

## 2018-09-28 DIAGNOSIS — D1803 Hemangioma of intra-abdominal structures: Secondary | ICD-10-CM

## 2018-09-28 DIAGNOSIS — T451X5A Adverse effect of antineoplastic and immunosuppressive drugs, initial encounter: Principal | ICD-10-CM

## 2018-09-28 LAB — CBC WITH DIFFERENTIAL/PLATELET
Abs Immature Granulocytes: 0.12 10*3/uL — ABNORMAL HIGH (ref 0.00–0.07)
BASOS PCT: 1 %
Basophils Absolute: 0 10*3/uL (ref 0.0–0.1)
EOS ABS: 0 10*3/uL (ref 0.0–0.5)
Eosinophils Relative: 1 %
HEMATOCRIT: 35.8 % — AB (ref 36.0–46.0)
HEMOGLOBIN: 11.8 g/dL — AB (ref 12.0–15.0)
Immature Granulocytes: 3 %
Lymphocytes Relative: 34 %
Lymphs Abs: 1.3 10*3/uL (ref 0.7–4.0)
MCH: 30.3 pg (ref 26.0–34.0)
MCHC: 33 g/dL (ref 30.0–36.0)
MCV: 92 fL (ref 80.0–100.0)
Monocytes Absolute: 0.8 10*3/uL (ref 0.1–1.0)
Monocytes Relative: 20 %
Neutro Abs: 1.6 10*3/uL — ABNORMAL LOW (ref 1.7–7.7)
Neutrophils Relative %: 41 %
Platelets: 155 10*3/uL (ref 150–400)
RBC: 3.89 MIL/uL (ref 3.87–5.11)
RDW: 20.5 % — ABNORMAL HIGH (ref 11.5–15.5)
WBC: 3.7 10*3/uL — ABNORMAL LOW (ref 4.0–10.5)
nRBC: 0 % (ref 0.0–0.2)

## 2018-09-28 LAB — COMPREHENSIVE METABOLIC PANEL
ALT: 32 U/L (ref 0–44)
AST: 26 U/L (ref 15–41)
Albumin: 2.6 g/dL — ABNORMAL LOW (ref 3.5–5.0)
Alkaline Phosphatase: 59 U/L (ref 38–126)
Anion gap: 7 (ref 5–15)
BUN: 15 mg/dL (ref 8–23)
CALCIUM: 8.6 mg/dL — AB (ref 8.9–10.3)
CO2: 21 mmol/L — ABNORMAL LOW (ref 22–32)
Chloride: 108 mmol/L (ref 98–111)
Creatinine, Ser: 0.53 mg/dL (ref 0.44–1.00)
GFR calc Af Amer: >60 mL/min (ref >60)
GFR calc non Af Amer: >60 mL/min (ref >60)
Glucose, Bld: 105 mg/dL — ABNORMAL HIGH (ref 70–99)
Potassium: 3.9 mmol/L (ref 3.5–5.1)
Sodium: 136 mmol/L (ref 135–145)
Total Bilirubin: 0.5 mg/dL (ref 0.3–1.2)
Total Protein: 5.2 g/dL — ABNORMAL LOW (ref 6.5–8.1)

## 2018-09-28 MED ORDER — SODIUM CHLORIDE 0.9 % IV SOLN: Freq: Once | INTRAVENOUS | Status: DC

## 2018-09-28 MED ORDER — SODIUM CHLORIDE 0.9% FLUSH
10.0000 mL | INTRAVENOUS | Status: DC | PRN
  Administered 2018-09-28: 10 mL via INTRAVENOUS
  Filled 2018-09-28: qty 10

## 2018-09-28 MED ORDER — HEPARIN SOD (PORK) LOCK FLUSH 100 UNIT/ML IV SOLN
500.0000 [IU] | Freq: Once | INTRAVENOUS | Status: AC
  Administered 2018-09-28: 500 [IU] via INTRAVENOUS

## 2018-09-28 MED ORDER — SODIUM CHLORIDE 0.9 % IV SOLN
Freq: Once | INTRAVENOUS | Status: AC
  Administered 2018-09-28: 11:00:00 via INTRAVENOUS
  Filled 2018-09-28: qty 250

## 2018-09-28 NOTE — Progress Notes (Signed)
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 She was recently found to have iron deficiency anemia which did not get better despite taking iron supplements. She underwent colonoscopy by Dr. Alice Reichert in August 2019 which showed a partially obstructing mass in the distal ascending colon. This was biopsied and was consistent with moderately differentiated adenocarcinoma. Patient also had a CT abdomen and pelvis with contrast in August 2019 which showed a 3.2 cm constricting apple core type ascending colon cancer near the hepatic flexure. Small pericolonic lymph nodes but no overt adenopathy. 3.5 mm left hepatic lobe lesion which was eventually characterized as hemangioma on MRI. CT chest showed small subcentimeter pulmonary nodules which were nonspecific.  Patient underwent right hemicolectomy on 07/06/2018 which showed moderately differentiated adenocarcinoma with invasion through the visceral peritoneum. Metastatic carcinoma in 1 of 44 regional lymph nodes. Margins were negative. No lymphovascular invasion or perineural invasion was identified. No tumor deposits identified. pT4a pN1aMSI stable.Cycle 1 of adjuvant FOLFOX chemotherapy started on 08/03/2018  Interval history-patient tolerated her first 3 cycles of chemotherapy well.  She then started having significant diarrhea after her fourth  cycle of chemotherapy which started about 5 days back but has still continued.  She felt a little better after she received IV fluids and symptom management clinic last week.  She had no diarrhea yesterday but again reported 5 episodes of watery diarrhea this morning.  She reports fatigue because of diarrhea and is trying to keep up with her oral and fluid intake.  Denies any nausea or vomiting.  Denies any fever  ECOG PS- 1 Pain scale- 0 Opioid associated constipation- no  Review of systems- Review of Systems  Constitutional: Positive for malaise/fatigue. Negative for chills, fever and weight loss.  HENT: Negative for congestion, ear discharge and nosebleeds.   Eyes: Negative for blurred vision.  Respiratory: Negative for cough, hemoptysis, sputum production, shortness of breath and wheezing.   Cardiovascular: Negative for chest pain, palpitations, orthopnea and claudication.  Gastrointestinal: Negative for abdominal pain, blood in stool, constipation, diarrhea, heartburn, melena, nausea and vomiting.  Genitourinary: Negative for dysuria, flank pain, frequency, hematuria and urgency.  Musculoskeletal: Negative for back pain, joint pain and myalgias.  Skin: Negative for rash.  Neurological: Negative for dizziness, tingling, focal weakness, seizures, weakness and headaches.  Endo/Heme/Allergies: Does not bruise/bleed easily.  Psychiatric/Behavioral: Negative for depression and suicidal ideas. The patient does not have insomnia.       Allergies  Allergen Reactions  . Biaxin [Clarithromycin] Shortness Of Breath  . Levofloxacin     Muscle pain  . Penicillins Rash    Has patient had a PCN reaction causing immediate rash, facial/tongue/throat swelling, SOB or lightheadedness with hypotension: no Has patient had a PCN reaction causing severe rash involving mucus membranes or skin necrosis: no Has patient had a PCN reaction that required hospitalization: no Has patient had a PCN reaction  occurring within the last 10 years: no If all of the above answers are "NO",  then may proceed with Cephalosporin use.      Past Medical History:  Diagnosis Date  . Arthritis    hips  . B12 deficiency   . Colon cancer ([place]) 07/06/2018  . 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 COPD (chronic obstructive pulmonary disease) ([place])   . Fibrocystic breast disease   . Mediastinal mass    removed 1/16  . Motion sickness    fair rides  . Osteoporosis   . Schizophrenia ([place])   . Vitamin D deficiency   . Wears dentures    partial upper and lower     Past Surgical History:  Procedure Laterality Date  . BREAST BIOPSY Left    neg  . BREAST BIOPSY Right 05/04/13   [place] bx/clip-neg  . COLONOSCOPY    . COLONOSCOPY WITH PROPOFOL N/A 06/10/2018   Procedure: COLONOSCOPY WITH PROPOFOL;  Surgeon: Toledo, [name], MD;  Location: [facility];  Service: Gastroenterology;  Laterality: N/A;  . ESOPHAGOGASTRODUODENOSCOPY N/A 06/10/2018   Procedure: ESOPHAGOGASTRODUODENOSCOPY (EGD);  Surgeon: Toledo, [name], MD;  Location: [facility];  Service: Gastroenterology;  Laterality: N/A;  . HALLUX VALGUS AKIN Right 11/20/2016   Procedure: HALLUX VALGUS AKIN;  Surgeon: Samara Deist, DPM;  Location: [place];  Service: Podiatry;  Laterality: Right;  . HALLUX VALGUS AUSTIN Right 11/20/2016   Procedure: HALLUX VALGUS AUSTIN  Alroy Dust) right;  Surgeon: Samara Deist, DPM;  Location: [place];  Service: Podiatry;  Laterality: Right;  IVA with Popliteal  . HALLUX VALGUS AUSTIN Left 01/15/2017   Procedure: HALLUX VALGUS AUSTIN  Correction left foot  Iva Popiteal;  Surgeon: Samara Deist, DPM;  Location: [place];  Service: Podiatry;  Laterality: Left;  IVA Popliteal   . LAPAROSCOPIC RIGHT COLECTOMY N/A 07/06/2018   Procedure: LAPAROSCOPIC COLECTOMY;  Surgeon: Benjamine Sprague, DO;  Location: [facility];  Service: General;  Laterality: N/A;  . MEDIASTINAL MASS EXCISION Left 11/09/2014    Dr. Wynelle Cleveland, [place]  . PORTACATH PLACEMENT Right 07/30/2018   Procedure: INSERTION PORT-A-CATH;  Surgeon: Benjamine Sprague, DO;  Location: [facility];  Service: General;  Laterality: Right;  . THORACOSCOPY Left 11/09/2014   with excision mediastinal mass  . TUBAL LIGATION      Social History   Socioeconomic History  . Marital status: Widowed    Spouse name: Not on file  . Number of children: Not on file  . Years of education: Not on file  . Highest education level: Not on file  Occupational History  . Not on file  Social Needs  . Financial resource strain: Not on file  . Food insecurity:    Worry: Not on file    Inability: Not on file  . Transportation needs:    Medical: Not on file    Non-medical: Not on file  Tobacco Use  . Smoking status: Former Smoker    Last attempt to quit: 10/18/2014    Years since quitting: 3.9  . Smokeless tobacco: Never Used  Substance and Sexual Activity  . Alcohol use: No  . Drug use: Never  . Sexual activity: Not Currently  Lifestyle  . Physical activity:    Days per week: Not on file    Minutes per session: Not on file  . Stress: Not on file  Relationships  . Social connections:    Talks on phone: Not on file    Gets together: Not on file    Attends religious service: Not on file    Active member of  club or organization: Not on file    Attends meetings of clubs or organizations: Not on file    Relationship status: Not on file  . Intimate partner violence:    Fear of current or ex partner: Not on file    Emotionally abused: Not on file    Physically abused: Not on file    Forced sexual activity: Not on file  Other Topics Concern  . Not on file  Social History Narrative  . Not on file    Family History  Problem Relation Age of Onset  . Cancer Father   . Breast cancer Neg Hx      Current Outpatient Medications:  .  ASPIRIN 81 PO, Take 81 mg by mouth daily. , Disp: , Rfl:  .  Cholecalciferol (VITAMIN D3) 2000 units TABS, Take  2,000 Units by mouth daily. , Disp: , Rfl:  .  conjugated estrogens (PREMARIN) vaginal cream, Place 1 Applicatorful vaginally 2 (two) times a week. , Disp: , Rfl:  .  dexamethasone (DECADRON) 4 MG tablet, Take 2 tablets (8 mg total) by mouth daily. Start the day after chemotherapy for 2 days. Take with food., Disp: 30 tablet, Rfl: 1 .  diphenoxylate-atropine (LOMOTIL) 2.5-0.025 MG tablet, Take 1 tablet by mouth 4 (four) times daily as needed for diarrhea or loose stools., Disp: 30 tablet, Rfl: 0 .  lidocaine-prilocaine (EMLA) cream, APPLY EXTERNALLY TO THE AFFECTED AREA 1 TIME, Disp: 30 g, Rfl: 0 .  magic mouthwash w/lidocaine SOLN, Take 10 mLs by mouth 4 (four) times daily., Disp: 480 mL, Rfl: 3 .  potassium chloride SA (K-DUR,KLOR-CON) 20 MEQ tablet, TAKE 2 TABLET BY MOUTH TWICE DAILY, Disp: 360 tablet, Rfl: 0 .  PROCYCLIDINE HCL PO, Take 5 mg by mouth 3 (three) times daily. (Kemadrine) , Disp: , Rfl:  .  thiothixene (NAVANE) 5 MG capsule, Take 10 mg by mouth at bedtime. , Disp: , Rfl:  .  vitamin B-12 (CYANOCOBALAMIN) 1000 MCG tablet, Take 1,000 mcg by mouth daily. , Disp: , Rfl:  .  ibuprofen (ADVIL,MOTRIN) 800 MG tablet, Take 1 tablet (800 mg total) by mouth every 8 (eight) hours as needed for mild pain or moderate pain. (Patient not taking: Reported on 09/24/2018), Disp: 30 tablet, Rfl: 0 .  LORazepam (ATIVAN) 0.5 MG tablet, Take 1 tablet (0.5 mg total) by mouth every 6 (six) hours as needed (Nausea or vomiting). (Patient not taking: Reported on 09/28/2018), Disp: 30 tablet, Rfl: 0 .  nystatin (MYCOSTATIN) 100000 UNIT/ML suspension, Take 5 mLs (500,000 Units total) by mouth 4 (four) times daily. (Patient not taking: Reported on 09/28/2018), Disp: 60 mL, Rfl: 1 .  ondansetron (ZOFRAN) 8 MG tablet, Take 1 tablet (8 mg total) by mouth 2 (two) times daily as needed for refractory nausea / vomiting. Start on day 3 after chemotherapy. (Patient not taking: Reported on 07/30/2018), Disp: 30 tablet, Rfl: 1 .   polyethylene glycol (MIRALAX / GLYCOLAX) packet, Take 17 g by mouth daily., Disp: , Rfl:  .  prochlorperazine (COMPAZINE) 10 MG tablet, Take 1 tablet (10 mg total) by mouth every 6 (six) hours as needed (Nausea or vomiting). (Patient not taking: Reported on 09/28/2018), Disp: 30 tablet, Rfl: 1 .  senna (SENOKOT) 8.6 MG tablet, Take 1 tablet by mouth daily., Disp: , Rfl:   Physical exam:  Vitals:   09/28/18 1026  BP: 110/74  Pulse: 84  Resp: 16  Temp: (!) 96 F (35.6 C)  TempSrc: Tympanic  Weight: 148 lb  7.7 oz (67.4 kg)   Physical Exam  Constitutional: She is oriented to person, place, and time. She appears well-developed and well-nourished.  Appears mildly fatigued  HENT:  Head: Normocephalic and atraumatic.  Mucous membranes are moist.  No mouth sores  Eyes: Pupils are equal, round, and reactive to light. EOM are normal.  Neck: Normal range of motion.  Cardiovascular: Normal rate, regular rhythm and normal heart sounds.  Pulmonary/Chest: Effort normal and breath sounds normal.  Abdominal: Soft. Bowel sounds are normal. She exhibits no distension. There is no tenderness.  Neurological: She is alert and oriented to person, place, and time.  Skin: Skin is warm and dry.     CMP Latest Ref Rng & Units 09/28/2018  Glucose 70 - 99 mg/dL 105(H)  BUN 8 - 23 mg/dL 15  Creatinine 0.44 - 1.00 mg/dL 0.53  Sodium 135 - 145 mmol/L 136  Potassium 3.5 - 5.1 mmol/L 3.9  Chloride 98 - 111 mmol/L 108  CO2 22 - 32 mmol/L 21(L)  Calcium 8.9 - 10.3 mg/dL 8.6(L)  Total Protein 6.5 - 8.1 g/dL 5.2(L)  Total Bilirubin 0.3 - 1.2 mg/dL 0.5  Alkaline Phos 38 - 126 U/L 59  AST 15 - 41 U/L 26  ALT 0 - 44 U/L 32   CBC Latest Ref Rng & Units 09/28/2018  WBC 4.0 - 10.5 K/uL 3.7(L)  Hemoglobin 12.0 - 15.0 g/dL 11.8(L)  Hematocrit 36.0 - 46.0 % 35.8(L)  Platelets 150 - 400 K/uL 155     Assessment and plan- Patient is a 72 y.o. female with h/ostage IIIb adenocarcinoma of the colonpT4apN1a cM0status  post hemicolectomy.She is here for on treatment assessment prior to cycle 5 of adjuvant FOLFOX chemotherapy  Patient is leukopenic today with a white count of 3.7 and an absolute neutrophil count of 1.6.  Moreover she has ongoing chemo induced diarrhea and will not be receiving any chemotherapy today.  Stool for C. difficile and and distended PCR is negative.  Continue supportive treatment for now.  I will give her 1 L of IV fluids today.  Advised her to go back on Imodium from Lomotil but take 2 mg with each bowel movement to a maximum of 16 mg.  She will return back to clinic in 2 days time for another liter of IV fluids and we will assess her symptoms at that time to see if her diarrhea is better.  Since her diarrhea has not responded well to Imodium and Lomotil I would like to give her long-acting octreotide 30 mg x 1 and we are currently awaiting insurance approval for this and she can hopefully get it in 2 days time.  Patient will call us and let us know if she does not feel better.  She were to develop any fever or worsening abdominal pain ongoing diarrhea she will need a CT abdomen and pelvis with contrast to rule out neutropenic enterocolitis.  She knows that she needs to try to keep up with her oral intake in the meanwhile to stay hydrated.  Her hypokalemia has now resolved and I will not be giving her any IV potassium today.  She will continue with her oral potassium supplements for now.  I will tentatively see her back in 1 week's time with CBC and CMP for cycle 5 of adjuvant FOLFOX and will consider dose modifications at that time   Visit Diagnosis 1. Chemotherapy induced neutropenia (HCC)   2. Chemotherapy induced diarrhea      Dr. Randa Evens,  MD, MPH [place] [facility] [phone] 09/28/2018 4:29 PM

## 2018-09-28 NOTE — Patient Instructions (Signed)
Dehydration, Adult Dehydration is when there is not enough fluid or water in your body. This happens when you lose more fluids than you take in. Dehydration can range from mild to very bad. It should be treated right away to keep it from getting very bad. Symptoms of mild dehydration may include:  Thirst.  Dry lips.  Slightly dry mouth.  Dry, warm skin.  Dizziness. Symptoms of moderate dehydration may include:  Very dry mouth.  Muscle cramps.  Dark pee (urine). Pee may be the color of tea.  Your body making less pee.  Your eyes making fewer tears.  Heartbeat that is uneven or faster than normal (palpitations).  Headache.  Light-headedness, especially when you stand up from sitting.  Fainting (syncope). Symptoms of very bad dehydration may include:  Changes in skin, such as: ? Cold and clammy skin. ? Blotchy (mottled) or pale skin. ? Skin that does not quickly return to normal after being lightly pinched and let go (poor skin turgor).  Changes in body fluids, such as: ? Feeling very thirsty. ? Your eyes making fewer tears. ? Not sweating when body temperature is high, such as in hot weather. ? Your body making very little pee.  Changes in vital signs, such as: ? Weak pulse. ? Pulse that is more than 100 beats a minute when you are sitting still. ? Fast breathing. ? Low blood pressure.  Other changes, such as: ? Sunken eyes. ? Cold hands and feet. ? Confusion. ? Lack of energy (lethargy). ? Trouble waking up from sleep. ? Short-term weight loss. ? Unconsciousness. Follow these instructions at home:  If told by your doctor, drink an ORS: ? Make an ORS by using instructions on the package. ? Start by drinking small amounts, about  cup (120 mL) every 5-10 minutes. ? Slowly drink more until you have had the amount that your doctor said to have.  Drink enough clear fluid to keep your pee clear or pale yellow. If you were told to drink an ORS, finish the ORS  first, then start slowly drinking clear fluids. Drink fluids such as: ? Water. Do not drink only water by itself. Doing that can make the salt (sodium) level in your body get too low (hyponatremia). ? Ice chips. ? Fruit juice that you have added water to (diluted). ? Low-calorie sports drinks.  Avoid: ? Alcohol. ? Drinks that have a lot of sugar. These include high-calorie sports drinks, fruit juice that does not have water added, and soda. ? Caffeine. ? Foods that are greasy or have a lot of fat or sugar.  Take over-the-counter and prescription medicines only as told by your doctor.  Do not take salt tablets. Doing that can make the salt level in your body get too high (hypernatremia).  Eat foods that have minerals (electrolytes). Examples include bananas, oranges, potatoes, tomatoes, and spinach.  Keep all follow-up visits as told by your doctor. This is important. Contact a doctor if:  You have belly (abdominal) pain that: ? Gets worse. ? Stays in one area (localizes).  You have a rash.  You have a stiff neck.  You get angry or annoyed more easily than normal (irritability).  You are more sleepy than normal.  You have a harder time waking up than normal.  You feel: ? Weak. ? Dizzy. ? Very thirsty.  You have peed (urinated) only a small amount of very dark pee during 6-8 hours. Get help right away if:  You have symptoms of   very bad dehydration.  You cannot drink fluids without throwing up (vomiting).  Your symptoms get worse with treatment.  You have a fever.  You have a very bad headache.  You are throwing up or having watery poop (diarrhea) and it: ? Gets worse. ? Does not go away.  You have blood or something green (bile) in your throw-up.  You have blood in your poop (stool). This may cause poop to look black and tarry.  You have not peed in 6-8 hours.  You pass out (faint).  Your heart rate when you are sitting still is more than 100 beats a  minute.  You have trouble breathing. This information is not intended to replace advice given to you by your health care provider. Make sure you discuss any questions you have with your health care provider. Document Released: 08/03/2009 Document Revised: 04/26/2016 Document Reviewed: 12/01/2015 Elsevier Interactive Patient Education  2018 [employer]  

## 2018-09-30 ENCOUNTER — Other Ambulatory Visit: Payer: Self-pay | Admitting: *Deleted

## 2018-09-30 ENCOUNTER — Inpatient Hospital Stay: Payer: Medicare Other

## 2018-09-30 VITALS — BP 104/70 | HR 88 | Temp 95.9°F | Resp 16

## 2018-09-30 DIAGNOSIS — C182 Malignant neoplasm of ascending colon: Secondary | ICD-10-CM

## 2018-09-30 DIAGNOSIS — E860 Dehydration: Secondary | ICD-10-CM

## 2018-09-30 DIAGNOSIS — R300 Dysuria: Secondary | ICD-10-CM

## 2018-09-30 LAB — COMPREHENSIVE METABOLIC PANEL
ALT: 25 U/L (ref 0–44)
AST: 20 U/L (ref 15–41)
Albumin: 2.7 g/dL — ABNORMAL LOW (ref 3.5–5.0)
Alkaline Phosphatase: 61 U/L (ref 38–126)
Anion gap: 9 (ref 5–15)
BUN: 11 mg/dL (ref 8–23)
CHLORIDE: 104 mmol/L (ref 98–111)
CO2: 23 mmol/L (ref 22–32)
Calcium: 8.4 mg/dL — ABNORMAL LOW (ref 8.9–10.3)
Creatinine, Ser: 0.58 mg/dL (ref 0.44–1.00)
GFR calc Af Amer: >60 mL/min (ref >60)
GFR calc non Af Amer: >60 mL/min (ref >60)
Glucose, Bld: 102 mg/dL — ABNORMAL HIGH (ref 70–99)
Potassium: 4.2 mmol/L (ref 3.5–5.1)
Sodium: 136 mmol/L (ref 135–145)
TOTAL PROTEIN: 5.3 g/dL — AB (ref 6.5–8.1)
Total Bilirubin: 0.7 mg/dL (ref 0.3–1.2)

## 2018-09-30 LAB — URINALYSIS, COMPLETE (UACMP) WITH MICROSCOPIC
Bilirubin Urine: NEGATIVE
Glucose, UA: NEGATIVE mg/dL
Ketones, ur: NEGATIVE mg/dL
Nitrite: NEGATIVE
Protein, ur: NEGATIVE mg/dL
Specific Gravity, Urine: 1.02 (ref 1.005–1.030)
WBC, UA: >50 WBC/hpf (ref 0–5)
pH: 5.5 (ref 5.0–8.0)

## 2018-09-30 MED ORDER — SODIUM CHLORIDE 0.9 % IV SOLN
Freq: Once | INTRAVENOUS | Status: AC
  Administered 2018-09-30: 10:00:00 via INTRAVENOUS
  Filled 2018-09-30: qty 250

## 2018-09-30 MED ORDER — SODIUM CHLORIDE 0.9% FLUSH
10.0000 mL | INTRAVENOUS | Status: DC | PRN
  Administered 2018-09-30: 10 mL via INTRAVENOUS
  Filled 2018-09-30: qty 10

## 2018-09-30 MED ORDER — SULFAMETHOXAZOLE-TRIMETHOPRIM 800-160 MG PO TABS: 1.0000 | ORAL_TABLET | Freq: Two times a day (BID) | ORAL | 0 refills | Status: DC

## 2018-09-30 MED ORDER — HEPARIN SOD (PORK) LOCK FLUSH 100 UNIT/ML IV SOLN
500.0000 [IU] | Freq: Once | INTRAVENOUS | Status: AC
  Administered 2018-09-30: 500 [IU] via INTRAVENOUS

## 2018-09-30 NOTE — Progress Notes (Signed)
Patient here today.  Vital signs stable.  Labwork within range.  Patient reports that stools are not liquid anymore but not fully formed.  Patient is aware that an antibiotic will be called in for her urine bacteria.

## 2018-09-30 NOTE — Patient Instructions (Signed)
Dehydration, Adult Dehydration is when there is not enough fluid or water in your body. This happens when you lose more fluids than you take in. Dehydration can range from mild to very bad. It should be treated right away to keep it from getting very bad. Symptoms of mild dehydration may include:  Thirst.  Dry lips.  Slightly dry mouth.  Dry, warm skin.  Dizziness. Symptoms of moderate dehydration may include:  Very dry mouth.  Muscle cramps.  Dark pee (urine). Pee may be the color of tea.  Your body making less pee.  Your eyes making fewer tears.  Heartbeat that is uneven or faster than normal (palpitations).  Headache.  Light-headedness, especially when you stand up from sitting.  Fainting (syncope). Symptoms of very bad dehydration may include:  Changes in skin, such as: ? Cold and clammy skin. ? Blotchy (mottled) or pale skin. ? Skin that does not quickly return to normal after being lightly pinched and let go (poor skin turgor).  Changes in body fluids, such as: ? Feeling very thirsty. ? Your eyes making fewer tears. ? Not sweating when body temperature is high, such as in hot weather. ? Your body making very little pee.  Changes in vital signs, such as: ? Weak pulse. ? Pulse that is more than 100 beats a minute when you are sitting still. ? Fast breathing. ? Low blood pressure.  Other changes, such as: ? Sunken eyes. ? Cold hands and feet. ? Confusion. ? Lack of energy (lethargy). ? Trouble waking up from sleep. ? Short-term weight loss. ? Unconsciousness. Follow these instructions at home:  If told by your doctor, drink an ORS: ? Make an ORS by using instructions on the package. ? Start by drinking small amounts, about  cup (120 mL) every 5-10 minutes. ? Slowly drink more until you have had the amount that your doctor said to have.  Drink enough clear fluid to keep your pee clear or pale yellow. If you were told to drink an ORS, finish the ORS  first, then start slowly drinking clear fluids. Drink fluids such as: ? Water. Do not drink only water by itself. Doing that can make the salt (sodium) level in your body get too low (hyponatremia). ? Ice chips. ? Fruit juice that you have added water to (diluted). ? Low-calorie sports drinks.  Avoid: ? Alcohol. ? Drinks that have a lot of sugar. These include high-calorie sports drinks, fruit juice that does not have water added, and soda. ? Caffeine. ? Foods that are greasy or have a lot of fat or sugar.  Take over-the-counter and prescription medicines only as told by your doctor.  Do not take salt tablets. Doing that can make the salt level in your body get too high (hypernatremia).  Eat foods that have minerals (electrolytes). Examples include bananas, oranges, potatoes, tomatoes, and spinach.  Keep all follow-up visits as told by your doctor. This is important. Contact a doctor if:  You have belly (abdominal) pain that: ? Gets worse. ? Stays in one area (localizes).  You have a rash.  You have a stiff neck.  You get angry or annoyed more easily than normal (irritability).  You are more sleepy than normal.  You have a harder time waking up than normal.  You feel: ? Weak. ? Dizzy. ? Very thirsty.  You have peed (urinated) only a small amount of very dark pee during 6-8 hours. Get help right away if:  You have symptoms of   very bad dehydration.  You cannot drink fluids without throwing up (vomiting).  Your symptoms get worse with treatment.  You have a fever.  You have a very bad headache.  You are throwing up or having watery poop (diarrhea) and it: ? Gets worse. ? Does not go away.  You have blood or something green (bile) in your throw-up.  You have blood in your poop (stool). This may cause poop to look black and tarry.  You have not peed in 6-8 hours.  You pass out (faint).  Your heart rate when you are sitting still is more than 100 beats a  minute.  You have trouble breathing. This information is not intended to replace advice given to you by your health care provider. Make sure you discuss any questions you have with your health care provider. Document Released: 08/03/2009 Document Revised: 04/26/2016 Document Reviewed: 12/01/2015 Elsevier Interactive Patient Education  2018 [employer]  

## 2018-10-02 ENCOUNTER — Telehealth: Payer: Self-pay

## 2018-10-02 LAB — URINE CULTURE

## 2018-10-05 ENCOUNTER — Inpatient Hospital Stay: Payer: Medicare Other

## 2018-10-05 ENCOUNTER — Encounter: Payer: Self-pay | Admitting: Oncology

## 2018-10-05 ENCOUNTER — Inpatient Hospital Stay (HOSPITAL_BASED_OUTPATIENT_CLINIC_OR_DEPARTMENT_OTHER): Payer: Medicare Other | Admitting: Oncology

## 2018-10-05 VITALS — BP 101/67 | HR 80 | Temp 96.4°F | Resp 16 | Wt 147.5 lb

## 2018-10-05 DIAGNOSIS — M549 Dorsalgia, unspecified: Secondary | ICD-10-CM

## 2018-10-05 DIAGNOSIS — M129 Arthropathy, unspecified: Secondary | ICD-10-CM

## 2018-10-05 DIAGNOSIS — C182 Malignant neoplasm of ascending colon: Secondary | ICD-10-CM

## 2018-10-05 DIAGNOSIS — E559 Vitamin D deficiency, unspecified: Secondary | ICD-10-CM

## 2018-10-05 DIAGNOSIS — M810 Age-related osteoporosis without current pathological fracture: Secondary | ICD-10-CM

## 2018-10-05 DIAGNOSIS — D1803 Hemangioma of intra-abdominal structures: Secondary | ICD-10-CM

## 2018-10-05 DIAGNOSIS — J449 Chronic obstructive pulmonary disease, unspecified: Secondary | ICD-10-CM

## 2018-10-05 DIAGNOSIS — Z5111 Encounter for antineoplastic chemotherapy: Secondary | ICD-10-CM

## 2018-10-05 DIAGNOSIS — F209 Schizophrenia, unspecified: Secondary | ICD-10-CM

## 2018-10-05 DIAGNOSIS — Z87891 Personal history of nicotine dependence: Secondary | ICD-10-CM

## 2018-10-05 DIAGNOSIS — K1230 Oral mucositis (ulcerative), unspecified: Secondary | ICD-10-CM

## 2018-10-05 DIAGNOSIS — D6481 Anemia due to antineoplastic chemotherapy: Secondary | ICD-10-CM | POA: Diagnosis not present

## 2018-10-05 DIAGNOSIS — J069 Acute upper respiratory infection, unspecified: Secondary | ICD-10-CM

## 2018-10-05 DIAGNOSIS — D6959 Other secondary thrombocytopenia: Secondary | ICD-10-CM

## 2018-10-05 DIAGNOSIS — C183 Malignant neoplasm of hepatic flexure: Secondary | ICD-10-CM

## 2018-10-05 DIAGNOSIS — D509 Iron deficiency anemia, unspecified: Secondary | ICD-10-CM

## 2018-10-05 DIAGNOSIS — R197 Diarrhea, unspecified: Secondary | ICD-10-CM

## 2018-10-05 DIAGNOSIS — T451X5A Adverse effect of antineoplastic and immunosuppressive drugs, initial encounter: Secondary | ICD-10-CM

## 2018-10-05 DIAGNOSIS — Z79899 Other long term (current) drug therapy: Secondary | ICD-10-CM

## 2018-10-05 DIAGNOSIS — K5900 Constipation, unspecified: Secondary | ICD-10-CM

## 2018-10-05 DIAGNOSIS — K521 Toxic gastroenteritis and colitis: Secondary | ICD-10-CM

## 2018-10-05 DIAGNOSIS — Z7982 Long term (current) use of aspirin: Secondary | ICD-10-CM

## 2018-10-05 LAB — CBC WITH DIFFERENTIAL/PLATELET
Abs Immature Granulocytes: 0.08 10*3/uL — ABNORMAL HIGH (ref 0.00–0.07)
Basophils Absolute: 0 10*3/uL (ref 0.0–0.1)
Basophils Relative: 1 %
Eosinophils Absolute: 0 10*3/uL (ref 0.0–0.5)
Eosinophils Relative: 1 %
HCT: 34.1 % — ABNORMAL LOW (ref 36.0–46.0)
Hemoglobin: 10.8 g/dL — ABNORMAL LOW (ref 12.0–15.0)
Immature Granulocytes: 2 %
Lymphocytes Relative: 21 %
Lymphs Abs: 1.1 10*3/uL (ref 0.7–4.0)
MCH: 30.2 pg (ref 26.0–34.0)
MCHC: 31.7 g/dL (ref 30.0–36.0)
MCV: 95.3 fL (ref 80.0–100.0)
Monocytes Absolute: 0.6 10*3/uL (ref 0.1–1.0)
Monocytes Relative: 11 %
NEUTROS PCT: 64 %
Neutro Abs: 3.6 10*3/uL (ref 1.7–7.7)
PLATELETS: 143 10*3/uL — AB (ref 150–400)
RBC: 3.58 MIL/uL — ABNORMAL LOW (ref 3.87–5.11)
RDW: 20.5 % — AB (ref 11.5–15.5)
WBC: 5.4 10*3/uL (ref 4.0–10.5)
nRBC: 0 % (ref 0.0–0.2)

## 2018-10-05 LAB — COMPREHENSIVE METABOLIC PANEL
ALT: 25 U/L (ref 0–44)
AST: 26 U/L (ref 15–41)
Albumin: 2.7 g/dL — ABNORMAL LOW (ref 3.5–5.0)
Alkaline Phosphatase: 95 U/L (ref 38–126)
Anion gap: 6 (ref 5–15)
BUN: 15 mg/dL (ref 8–23)
CHLORIDE: 104 mmol/L (ref 98–111)
CO2: 26 mmol/L (ref 22–32)
Calcium: 8.4 mg/dL — ABNORMAL LOW (ref 8.9–10.3)
Creatinine, Ser: 0.88 mg/dL (ref 0.44–1.00)
GFR calc Af Amer: >60 mL/min (ref >60)
GFR calc non Af Amer: >60 mL/min (ref >60)
Glucose, Bld: 96 mg/dL (ref 70–99)
POTASSIUM: 3.8 mmol/L (ref 3.5–5.1)
Sodium: 136 mmol/L (ref 135–145)
Total Bilirubin: 0.7 mg/dL (ref 0.3–1.2)
Total Protein: 5.5 g/dL — ABNORMAL LOW (ref 6.5–8.1)

## 2018-10-05 MED ORDER — HEPARIN SOD (PORK) LOCK FLUSH 100 UNIT/ML IV SOLN: 500.0000 [IU] | Freq: Once | INTRAVENOUS | Status: AC

## 2018-10-05 MED ORDER — SODIUM CHLORIDE 0.9 % IV SOLN
2400.0000 mg/m2 | INTRAVENOUS | Status: AC
  Administered 2018-10-05: 4300 mg via INTRAVENOUS
  Filled 2018-10-05: qty 86

## 2018-10-05 MED ORDER — PALONOSETRON HCL INJECTION 0.25 MG/5ML
0.2500 mg | Freq: Once | INTRAVENOUS | Status: AC
  Administered 2018-10-05: 0.25 mg via INTRAVENOUS
  Filled 2018-10-05: qty 5

## 2018-10-05 MED ORDER — SODIUM CHLORIDE 0.9% FLUSH
10.0000 mL | INTRAVENOUS | Status: AC | PRN
  Administered 2018-10-05: 10 mL via INTRAVENOUS
  Filled 2018-10-05: qty 10

## 2018-10-05 MED ORDER — DEXTROSE 5 % IV SOLN
Freq: Once | INTRAVENOUS | Status: AC
  Administered 2018-10-05: 10:00:00 via INTRAVENOUS
  Filled 2018-10-05: qty 250

## 2018-10-05 MED ORDER — LEUCOVORIN CALCIUM INJECTION 350 MG
700.0000 mg | Freq: Once | INTRAVENOUS | Status: AC
  Administered 2018-10-05: 700 mg via INTRAVENOUS
  Filled 2018-10-05: qty 35

## 2018-10-05 MED ORDER — OXALIPLATIN CHEMO INJECTION 100 MG/20ML
65.0000 mg/m2 | Freq: Once | INTRAVENOUS | Status: AC
  Administered 2018-10-05: 115 mg via INTRAVENOUS
  Filled 2018-10-05: qty 23

## 2018-10-05 MED ORDER — DEXAMETHASONE SODIUM PHOSPHATE 10 MG/ML IJ SOLN
10.0000 mg | Freq: Once | INTRAMUSCULAR | Status: AC
  Administered 2018-10-05: 10 mg via INTRAVENOUS
  Filled 2018-10-05: qty 1

## 2018-10-05 NOTE — Progress Notes (Signed)
Pt reports productive cough (Thick, White) and Nasal Congestion that started yesterday. Lower Back Pain Starting Thursday. Pt states she is no longer have diarrhea and is "back to her normal". Denies any other complaints at this time.

## 2018-10-05 NOTE — Patient Instructions (Signed)

## 2018-10-05 NOTE — Addendum Note (Signed)
Addended by: Randa Evens C on: 10/05/2018 01:32 PM   Modules accepted: Orders

## 2018-10-05 NOTE — Progress Notes (Signed)
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 She was recently found to have iron deficiency anemia which did not get better despite taking iron supplements. She underwent colonoscopy by Dr. Alice Reichert in August 2019 which showed a partially obstructing mass in the distal ascending colon. This was biopsied and was consistent with moderately differentiated adenocarcinoma. Patient also had a CT abdomen and pelvis with contrast in August 2019 which showed a 3.2 cm constricting apple core type ascending colon cancer near the hepatic flexure. Small pericolonic lymph nodes but no overt adenopathy. 3.5 mm left hepatic lobe lesion which was eventually characterized as hemangioma on MRI. CT chest showed small subcentimeter pulmonary nodules which were nonspecific.  Patient underwent right hemicolectomy on 07/06/2018 which showed moderately differentiated adenocarcinoma with invasion through the visceral peritoneum. Metastatic carcinoma in 1 of 44 regional lymph nodes. Margins were negative. No lymphovascular invasion or perineural invasion was identified. No tumor deposits identified. pT4a pN1aMSI stable.Cycle 1 of adjuvant FOLFOX chemotherapy started on 08/03/2018  Interval history-patient reports some nasal congestion and headache as well as nonspecific back pain which started after she lifted a  suitcase.  Her diarrhea has now resolved.  Her last bowel movement was today and was formed.  She reports fatigue  ECOG PS- 1 Pain scale- 0 Opioid associated constipation- no  Review of systems- Review of Systems  Constitutional: Positive for malaise/fatigue. Negative for chills, fever and weight loss.  HENT: Positive for congestion. Negative for ear discharge and nosebleeds.   Eyes: Negative for blurred vision.  Respiratory: Negative for cough, hemoptysis, sputum production, shortness of breath and wheezing.   Cardiovascular: Negative for chest pain, palpitations, orthopnea and claudication.  Gastrointestinal: Negative for abdominal pain, blood in stool, constipation, diarrhea, heartburn, melena, nausea and vomiting.  Genitourinary: Negative for dysuria, flank pain, frequency, hematuria and urgency.  Musculoskeletal: Positive for back pain. Negative for joint pain and myalgias.  Skin: Negative for rash.  Neurological: Negative for dizziness, tingling, focal weakness, seizures, weakness and headaches.  Endo/Heme/Allergies: Does not bruise/bleed easily.  Psychiatric/Behavioral: Negative for depression and suicidal ideas. The patient does not have insomnia.        Allergies  Allergen Reactions  . Biaxin [Clarithromycin] Shortness Of Breath  . Levofloxacin     Muscle pain  . Penicillins Rash    Has patient had a PCN reaction causing immediate rash, facial/tongue/throat swelling, SOB or lightheadedness with hypotension: no Has patient had a PCN reaction causing severe rash involving mucus membranes or skin necrosis: no Has patient had a PCN reaction that required hospitalization: no Has patient had a PCN reaction occurring within the last 10 years: no If all of the above answers are "NO", then may proceed with Cephalosporin use.      Past Medical History:  Diagnosis Date  . Arthritis    hips  . B12 deficiency   . Colon cancer ([place]) 07/06/2018  . Constipation due to slow transit    . COPD (chronic obstructive  pulmonary disease) ([place])   . Fibrocystic breast disease   . Mediastinal mass    removed 1/16  . Motion sickness    fair rides  . Osteoporosis   . Schizophrenia ([place])   . Vitamin D deficiency   . Wears dentures    partial upper and lower     Past Surgical History:  Procedure Laterality Date  . BREAST BIOPSY Left    neg  . BREAST BIOPSY Right 05/04/13   [place] bx/clip-neg  . COLONOSCOPY    . COLONOSCOPY WITH PROPOFOL N/A 06/10/2018   Procedure: COLONOSCOPY WITH PROPOFOL;  Surgeon: Toledo, [name], MD;  Location: [facility];  Service: Gastroenterology;  Laterality: N/A;  . ESOPHAGOGASTRODUODENOSCOPY N/A 06/10/2018   Procedure: ESOPHAGOGASTRODUODENOSCOPY (EGD);  Surgeon: Toledo, [name], MD;  Location: [facility];  Service: Gastroenterology;  Laterality: N/A;  . HALLUX VALGUS AKIN Right 11/20/2016   Procedure: HALLUX VALGUS AKIN;  Surgeon: Samara Deist, DPM;  Location: [place];  Service: Podiatry;  Laterality: Right;  . HALLUX VALGUS AUSTIN Right 11/20/2016   Procedure: HALLUX VALGUS AUSTIN  Alroy Dust) right;  Surgeon: Samara Deist, DPM;  Location: [place];  Service: Podiatry;  Laterality: Right;  IVA with Popliteal  . HALLUX VALGUS AUSTIN Left 01/15/2017   Procedure: HALLUX VALGUS AUSTIN  Correction left foot  Iva Popiteal;  Surgeon: Samara Deist, DPM;  Location: [place];  Service: Podiatry;  Laterality: Left;  IVA Popliteal   . LAPAROSCOPIC RIGHT COLECTOMY N/A 07/06/2018   Procedure: LAPAROSCOPIC COLECTOMY;  Surgeon: Benjamine Sprague, DO;  Location: [facility];  Service: General;  Laterality: N/A;  . MEDIASTINAL MASS EXCISION Left 11/09/2014   Dr. Wynelle [place], [place]  . PORTACATH PLACEMENT Right 07/30/2018   Procedure: INSERTION PORT-A-CATH;  Surgeon: Benjamine Sprague, DO;  Location: [facility];  Service: General;  Laterality: Right;  . THORACOSCOPY Left 11/09/2014   with excision mediastinal mass  . TUBAL LIGATION       Social History   Socioeconomic History  . Marital status: Widowed    Spouse name: Not on file  . Number of children: Not on file  . Years of education: Not on file  . Highest education level: Not on file  Occupational History  . Not on file  Social Needs  . Financial resource strain: Not on file  . Food insecurity:    Worry: Not on file    Inability: Not on file  . Transportation needs:    Medical: Not on file    Non-medical: Not on file  Tobacco Use  . Smoking status: Former Smoker    Last attempt to quit: 10/18/2014    Years since quitting: 3.9  . Smokeless tobacco: Never Used  Substance and Sexual Activity  . Alcohol use: No  . Drug use: Never  . Sexual activity: Not Currently  Lifestyle  . Physical activity:    Days per week: Not on file    Minutes per session: Not on file  . Stress: Not on file  Relationships  . Social connections:    Talks on phone: Not on file    Gets together: Not on file    Attends religious service: Not on file    Active member of club or organization: Not on file    Attends meetings of clubs or organizations: Not on file    Relationship status: Not on file  . Intimate partner violence:    Fear of current or ex partner: Not on file    Emotionally abused:  Not on file    Physically abused: Not on file    Forced sexual activity: Not on file  Other Topics Concern  . Not on file  Social History Narrative  . Not on file    Family History  Problem Relation Age of Onset  . Cancer Father   . Breast cancer Neg Hx      Current Outpatient Medications:  .  ASPIRIN 81 PO, Take 81 mg by mouth daily. , Disp: , Rfl:  .  Cholecalciferol (VITAMIN D3) 2000 units TABS, Take 2,000 Units by mouth daily. , Disp: , Rfl:  .  conjugated estrogens (PREMARIN) vaginal cream, Place 1 Applicatorful vaginally 2 (two) times a week. , Disp: , Rfl:  .  dexamethasone (DECADRON) 4 MG tablet, Take 2 tablets (8 mg total) by mouth daily. Start the day after  chemotherapy for 2 days. Take with food., Disp: 30 tablet, Rfl: 1 .  diphenoxylate-atropine (LOMOTIL) 2.5-0.025 MG tablet, Take 1 tablet by mouth 4 (four) times daily as needed for diarrhea or loose stools., Disp: 30 tablet, Rfl: 0 .  ibuprofen (ADVIL,MOTRIN) 800 MG tablet, Take 1 tablet (800 mg total) by mouth every 8 (eight) hours as needed for mild pain or moderate pain., Disp: 30 tablet, Rfl: 0 .  lidocaine-prilocaine (EMLA) cream, APPLY EXTERNALLY TO THE AFFECTED AREA 1 TIME, Disp: 30 g, Rfl: 0 .  magic mouthwash w/lidocaine SOLN, Take 10 mLs by mouth 4 (four) times daily., Disp: 480 mL, Rfl: 3 .  PROCYCLIDINE HCL PO, Take 5 mg by mouth 3 (three) times daily. (Kemadrine) , Disp: , Rfl:  .  senna (SENOKOT) 8.6 MG tablet, Take 1 tablet by mouth daily., Disp: , Rfl:  .  thiothixene (NAVANE) 5 MG capsule, Take 10 mg by mouth at bedtime. , Disp: , Rfl:  .  vitamin B-12 (CYANOCOBALAMIN) 1000 MCG tablet, Take 1,000 mcg by mouth daily. , Disp: , Rfl:  .  LORazepam (ATIVAN) 0.5 MG tablet, Take 1 tablet (0.5 mg total) by mouth every 6 (six) hours as needed (Nausea or vomiting). (Patient not taking: Reported on 09/28/2018), Disp: 30 tablet, Rfl: 0 .  nystatin (MYCOSTATIN) 100000 UNIT/ML suspension, Take 5 mLs (500,000 Units total) by mouth 4 (four) times daily. (Patient not taking: Reported on 09/28/2018), Disp: 60 mL, Rfl: 1 .  ondansetron (ZOFRAN) 8 MG tablet, Take 1 tablet (8 mg total) by mouth 2 (two) times daily as needed for refractory nausea / vomiting. Start on day 3 after chemotherapy. (Patient not taking: Reported on 07/30/2018), Disp: 30 tablet, Rfl: 1 .  polyethylene glycol (MIRALAX / GLYCOLAX) packet, Take 17 g by mouth daily., Disp: , Rfl:  .  potassium chloride SA (K-DUR,KLOR-CON) 20 MEQ tablet, TAKE 2 TABLET BY MOUTH TWICE DAILY (Patient not taking: Reported on 10/05/2018), Disp: 360 tablet, Rfl: 0 .  prochlorperazine (COMPAZINE) 10 MG tablet, Take 1 tablet (10 mg total) by mouth every 6 (six)  hours as needed (Nausea or vomiting). (Patient not taking: Reported on 09/28/2018), Disp: 30 tablet, Rfl: 1 No current facility-administered medications for this visit.   Facility-Administered Medications Ordered in Other Visits:  .  fluorouracil (ADRUCIL) 4,300 mg in sodium chloride 0.9 % 64 mL chemo infusion, 2,400 mg/m2 (Treatment Plan Recorded), Intravenous, 1 day or 1 dose, Sindy Guadeloupe, MD .  heparin lock flush 100 unit/mL, 500 Units, Intravenous, Once, Sindy Guadeloupe, MD .  leucovorin 700 mg in dextrose 5 % 250 mL infusion, 700 mg, Intravenous, Once, Sindy Guadeloupe,  MD, Last Rate: 143 mL/hr at 10/05/18 1004, 700 mg at 10/05/18 1004 .  oxaliplatin (ELOXATIN) 115 mg in dextrose 5 % 500 mL chemo infusion, 65 mg/m2 (Treatment Plan Recorded), Intravenous, Once, Sindy Guadeloupe, MD, Last Rate: 262 mL/hr at 10/05/18 1006, 115 mg at 10/05/18 1006 .  sodium chloride flush (NS) 0.9 % injection 10 mL, 10 mL, Intravenous, PRN, Sindy Guadeloupe, MD, 10 mL at 10/05/18 0845  Physical exam:  Vitals:   10/05/18 0857  BP: 101/67  Pulse: 80  Resp: 16  Temp: (!) 96.4 F (35.8 C)  TempSrc: Tympanic  Weight: 147 lb 7.8 oz (66.9 kg)   Physical Exam Constitutional:      General: She is not in acute distress. HENT:     Head: Normocephalic and atraumatic.  Eyes:     Pupils: Pupils are equal, round, and reactive to light.  Neck:     Musculoskeletal: Normal range of motion.  Cardiovascular:     Rate and Rhythm: Normal rate and regular rhythm.     Heart sounds: Normal heart sounds.  Pulmonary:     Effort: Pulmonary effort is normal.     Breath sounds: Normal breath sounds.  Abdominal:     General: Bowel sounds are normal.     Palpations: Abdomen is soft.  Skin:    General: Skin is warm and dry.  Neurological:     Mental Status: She is alert and oriented to person, place, and time.      CMP Latest Ref Rng & Units 10/05/2018  Glucose 70 - 99 mg/dL 96  BUN 8 - 23 mg/dL 15  Creatinine 0.44 -  1.00 mg/dL 0.88  Sodium 135 - 145 mmol/L 136  Potassium 3.5 - 5.1 mmol/L 3.8  Chloride 98 - 111 mmol/L 104  CO2 22 - 32 mmol/L 26  Calcium 8.9 - 10.3 mg/dL 8.4(L)  Total Protein 6.5 - 8.1 g/dL 5.5(L)  Total Bilirubin 0.3 - 1.2 mg/dL 0.7  Alkaline Phos 38 - 126 U/L 95  AST 15 - 41 U/L 26  ALT 0 - 44 U/L 25   CBC Latest Ref Rng & Units 10/05/2018  WBC 4.0 - 10.5 K/uL 5.4  Hemoglobin 12.0 - 15.0 g/dL 10.8(L)  Hematocrit 36.0 - 46.0 % 34.1(L)  Platelets 150 - 400 K/uL 143(L)      Assessment and plan- Patient is a 72 y.o. female h/ostage IIIb adenocarcinoma of the colonpT4apN1a cM0status post hemicolectomy. He is here for on treatment assessment prior to cycle 5 of adjuvant FOLFOX chemotherapy  1.  Counts are okay to proceed with cycle 5 of adjuvant FOLFOX chemotherapy today.  Given that she had significant chemo induced diarrhea I will not give her 5-FU bolus.  She will only receive leucovorin and infusional 5-FU chemotherapy and oxaliplatin today.  She will be seen by nurse practitioner in 2 weeks with CBC CMP for infusional 5-FU chemotherapy and leucovorin.  At that time if her Williams is less than 2 she should receive udencya on 10/22/18.  If anc is <1 in 2 weeks, she should not receive chemotherapy. I will be entering [place] order for that day but will not sign it and it can be given if she needs it.  2.  She will see Dr. Mike Gip 4 weeks from now with CBC CMP for cycle 7 of infusional 5-FU chemotherapy and leucovorin and oxaliplatin.  Plan is to complete 12 cycles of adjuvant chemotherapy if possible followed by repeat scans  3.  Mild chemo  induced anemia and thrombocytopenia: Continue to monitor.  She was found to have iron deficiency back in October 2019 for which she received 2 doses of Feraheme.  I will repeat CBC ferritin and iron studies with her next set of labs.  4.  Her protein levels are low with an albumin of 2.7.  I have suggested that she should start taking Ensure 1-2 times  a day to improve her nutritional intake  5.  Patient will let us know if she develops any further diarrhea with this chemotherapy.  At that time she will call us and we will set her up for some IV fluids.  She also knows to take as needed Imodium with each bowel movement.  Plan was to give her octreotide if her diarrhea did not resolve during her last visit which she ultimately did not receive but can be considered in the future as well.  6.  Patient was complaining of burning urination during her last visit.  Urine culture showed Citrobacter and Enterobacteriaceae which was sensitive to Bactrim and patient was given 5 days of Bactrim.  Symptomatically she is feeling better today  7. Nasal congestion- likely viral. Hold off on antibiotics   Visit Diagnosis 1. Encounter for antineoplastic chemotherapy   2. Malignant neoplasm of ascending colon ([place])   3. Chemotherapy induced diarrhea   4. Chemotherapy-induced thrombocytopenia   5. Antineoplastic chemotherapy induced anemia   6. Iron deficiency anemia, unspecified iron deficiency anemia type      Dr. Randa Evens, MD, MPH [facility] at [facility] [phone] 10/05/2018 10:46 AM

## 2018-10-07 ENCOUNTER — Inpatient Hospital Stay: Payer: Medicare Other

## 2018-10-07 VITALS — BP 112/72 | HR 94 | Resp 16

## 2018-10-07 DIAGNOSIS — C182 Malignant neoplasm of ascending colon: Secondary | ICD-10-CM | POA: Diagnosis not present

## 2018-10-07 DIAGNOSIS — C183 Malignant neoplasm of hepatic flexure: Secondary | ICD-10-CM

## 2018-10-07 MED ORDER — HEPARIN SOD (PORK) LOCK FLUSH 100 UNIT/ML IV SOLN
500.0000 [IU] | Freq: Once | INTRAVENOUS | Status: AC | PRN
  Administered 2018-10-07: 500 [IU]

## 2018-10-07 MED ORDER — SODIUM CHLORIDE 0.9% FLUSH
10.0000 mL | INTRAVENOUS | Status: DC | PRN
  Administered 2018-10-07: 10 mL
  Filled 2018-10-07: qty 10

## 2018-10-12 ENCOUNTER — Telehealth: Payer: Self-pay | Admitting: *Deleted

## 2018-10-12 MED ORDER — DOXYCYCLINE HYCLATE 100 MG PO TABS: 100.0000 mg | ORAL_TABLET | Freq: Two times a day (BID) | ORAL | 0 refills | Status: DC

## 2018-10-12 NOTE — Telephone Encounter (Signed)
Patient called to say that she is still dealing with head cold, she has no fever, and is coughing up clear sputum. She feels like it is head cold and after she started taking sudafed. I told her that we usually do not give atb when you have no fever or yellow/green sputum.  I will check with Dr. Janese Banks and in between the daughter called and wanted to know if she could have atb during the holidays if she needs it if she did not get better. Dr. Janese Banks prefers that she treat the symptoms with OTC meds.  She is willing to send in doxycycline since patient has allergy to clarithromycin and zpak is contraindicationJust in case the pt. May need it if it gets worse. I told this to daughter over the phone. She will relay info to her mother.  Wants rx sent to [facility] [place]

## 2018-10-19 ENCOUNTER — Other Ambulatory Visit: Payer: Self-pay | Admitting: *Deleted

## 2018-10-19 ENCOUNTER — Inpatient Hospital Stay: Payer: Medicare Other

## 2018-10-19 ENCOUNTER — Encounter: Payer: Self-pay | Admitting: Nurse Practitioner

## 2018-10-19 ENCOUNTER — Inpatient Hospital Stay (HOSPITAL_BASED_OUTPATIENT_CLINIC_OR_DEPARTMENT_OTHER): Payer: Medicare Other | Admitting: Nurse Practitioner

## 2018-10-19 VITALS — BP 132/81 | HR 91 | Temp 97.7°F | Resp 18 | Ht 64.0 in | Wt 151.7 lb

## 2018-10-19 VITALS — BP 157/84 | HR 92 | Resp 16

## 2018-10-19 DIAGNOSIS — M810 Age-related osteoporosis without current pathological fracture: Secondary | ICD-10-CM

## 2018-10-19 DIAGNOSIS — C182 Malignant neoplasm of ascending colon: Secondary | ICD-10-CM

## 2018-10-19 DIAGNOSIS — J069 Acute upper respiratory infection, unspecified: Secondary | ICD-10-CM

## 2018-10-19 DIAGNOSIS — D6959 Other secondary thrombocytopenia: Secondary | ICD-10-CM | POA: Diagnosis not present

## 2018-10-19 DIAGNOSIS — E559 Vitamin D deficiency, unspecified: Secondary | ICD-10-CM

## 2018-10-19 DIAGNOSIS — M549 Dorsalgia, unspecified: Secondary | ICD-10-CM

## 2018-10-19 DIAGNOSIS — D509 Iron deficiency anemia, unspecified: Secondary | ICD-10-CM

## 2018-10-19 DIAGNOSIS — D6481 Anemia due to antineoplastic chemotherapy: Secondary | ICD-10-CM

## 2018-10-19 DIAGNOSIS — N390 Urinary tract infection, site not specified: Secondary | ICD-10-CM

## 2018-10-19 DIAGNOSIS — F209 Schizophrenia, unspecified: Secondary | ICD-10-CM

## 2018-10-19 DIAGNOSIS — D1803 Hemangioma of intra-abdominal structures: Secondary | ICD-10-CM

## 2018-10-19 DIAGNOSIS — Z87891 Personal history of nicotine dependence: Secondary | ICD-10-CM

## 2018-10-19 DIAGNOSIS — M129 Arthropathy, unspecified: Secondary | ICD-10-CM

## 2018-10-19 DIAGNOSIS — C183 Malignant neoplasm of hepatic flexure: Secondary | ICD-10-CM

## 2018-10-19 DIAGNOSIS — J449 Chronic obstructive pulmonary disease, unspecified: Secondary | ICD-10-CM

## 2018-10-19 DIAGNOSIS — Z7982 Long term (current) use of aspirin: Secondary | ICD-10-CM

## 2018-10-19 DIAGNOSIS — Z5111 Encounter for antineoplastic chemotherapy: Secondary | ICD-10-CM

## 2018-10-19 DIAGNOSIS — K1230 Oral mucositis (ulcerative), unspecified: Secondary | ICD-10-CM

## 2018-10-19 DIAGNOSIS — Z79899 Other long term (current) drug therapy: Secondary | ICD-10-CM

## 2018-10-19 DIAGNOSIS — R197 Diarrhea, unspecified: Secondary | ICD-10-CM

## 2018-10-19 DIAGNOSIS — K5900 Constipation, unspecified: Secondary | ICD-10-CM

## 2018-10-19 DIAGNOSIS — T451X5A Adverse effect of antineoplastic and immunosuppressive drugs, initial encounter: Secondary | ICD-10-CM

## 2018-10-19 DIAGNOSIS — K521 Toxic gastroenteritis and colitis: Secondary | ICD-10-CM

## 2018-10-19 LAB — CBC WITH DIFFERENTIAL/PLATELET
Abs Immature Granulocytes: 0.04 10*3/uL (ref 0.00–0.07)
Basophils Absolute: 0 10*3/uL (ref 0.0–0.1)
Basophils Relative: 0 %
Eosinophils Absolute: 0.6 10*3/uL — ABNORMAL HIGH (ref 0.0–0.5)
Eosinophils Relative: 10 %
HCT: 33.3 % — ABNORMAL LOW (ref 36.0–46.0)
Hemoglobin: 10.8 g/dL — ABNORMAL LOW (ref 12.0–15.0)
Immature Granulocytes: 1 %
Lymphocytes Relative: 30 %
Lymphs Abs: 1.7 10*3/uL (ref 0.7–4.0)
MCH: 31.6 pg (ref 26.0–34.0)
MCHC: 32.4 g/dL (ref 30.0–36.0)
MCV: 97.4 fL (ref 80.0–100.0)
Monocytes Absolute: 0.8 10*3/uL (ref 0.1–1.0)
Monocytes Relative: 14 %
NRBC: 0 % (ref 0.0–0.2)
Neutro Abs: 2.5 10*3/uL (ref 1.7–7.7)
Neutrophils Relative %: 45 %
Platelets: 194 10*3/uL (ref 150–400)
RBC: 3.42 MIL/uL — AB (ref 3.87–5.11)
RDW: 21.3 % — ABNORMAL HIGH (ref 11.5–15.5)
WBC: 5.6 10*3/uL (ref 4.0–10.5)

## 2018-10-19 LAB — COMPREHENSIVE METABOLIC PANEL
ALT: 25 U/L (ref 0–44)
AST: 26 U/L (ref 15–41)
Albumin: 3.1 g/dL — ABNORMAL LOW (ref 3.5–5.0)
Alkaline Phosphatase: 125 U/L (ref 38–126)
Anion gap: 7 (ref 5–15)
BUN: 21 mg/dL (ref 8–23)
CO2: 27 mmol/L (ref 22–32)
Calcium: 8.9 mg/dL (ref 8.9–10.3)
Chloride: 107 mmol/L (ref 98–111)
Creatinine, Ser: 0.69 mg/dL (ref 0.44–1.00)
GFR calc Af Amer: >60 mL/min (ref >60)
GFR calc non Af Amer: >60 mL/min (ref >60)
Glucose, Bld: 90 mg/dL (ref 70–99)
Potassium: 3.7 mmol/L (ref 3.5–5.1)
SODIUM: 141 mmol/L (ref 135–145)
Total Bilirubin: 0.3 mg/dL (ref 0.3–1.2)
Total Protein: 5.8 g/dL — ABNORMAL LOW (ref 6.5–8.1)

## 2018-10-19 LAB — IRON AND TIBC
Iron: 105 ug/dL (ref 28–170)
Saturation Ratios: 34 % — ABNORMAL HIGH (ref 10.4–31.8)
TIBC: 309 ug/dL (ref 250–450)
UIBC: 204 ug/dL

## 2018-10-19 LAB — FERRITIN: Ferritin: 260 ng/mL (ref 11–307)

## 2018-10-19 MED ORDER — CYCLOBENZAPRINE HCL 5 MG PO TABS: 5.0000 mg | ORAL_TABLET | Freq: Three times a day (TID) | ORAL | 0 refills | Status: DC | PRN

## 2018-10-19 MED ORDER — PALONOSETRON HCL INJECTION 0.25 MG/5ML
0.2500 mg | Freq: Once | INTRAVENOUS | Status: AC
  Administered 2018-10-19: 0.25 mg via INTRAVENOUS
  Filled 2018-10-19: qty 5

## 2018-10-19 MED ORDER — DEXTROSE 5 % IV SOLN
Freq: Once | INTRAVENOUS | Status: AC
  Administered 2018-10-19: 10:00:00 via INTRAVENOUS
  Filled 2018-10-19: qty 250

## 2018-10-19 MED ORDER — ACETAMINOPHEN 325 MG PO TABS
650.0000 mg | ORAL_TABLET | Freq: Once | ORAL | Status: AC
  Administered 2018-10-19: 650 mg via ORAL
  Filled 2018-10-19: qty 2

## 2018-10-19 MED ORDER — SODIUM CHLORIDE 0.9 % IV SOLN
2400.0000 mg/m2 | INTRAVENOUS | Status: DC
  Administered 2018-10-19: 4300 mg via INTRAVENOUS
  Filled 2018-10-19: qty 86

## 2018-10-19 MED ORDER — BENZONATATE 100 MG PO CAPS: 100.0000 mg | ORAL_CAPSULE | Freq: Three times a day (TID) | ORAL | 0 refills | Status: DC | PRN

## 2018-10-19 MED ORDER — HEPARIN SOD (PORK) LOCK FLUSH 100 UNIT/ML IV SOLN: 500.0000 [IU] | Freq: Once | INTRAVENOUS | Status: DC | PRN

## 2018-10-19 MED ORDER — SODIUM CHLORIDE 0.9% FLUSH
10.0000 mL | INTRAVENOUS | Status: DC | PRN
  Administered 2018-10-19: 10 mL
  Filled 2018-10-19: qty 10

## 2018-10-19 MED ORDER — DEXAMETHASONE SODIUM PHOSPHATE 10 MG/ML IJ SOLN
10.0000 mg | Freq: Once | INTRAMUSCULAR | Status: AC
  Administered 2018-10-19: 10 mg via INTRAVENOUS
  Filled 2018-10-19: qty 1

## 2018-10-19 MED ORDER — LEUCOVORIN CALCIUM INJECTION 350 MG
700.0000 mg | Freq: Once | INTRAVENOUS | Status: AC
  Administered 2018-10-19: 700 mg via INTRAVENOUS
  Filled 2018-10-19: qty 35

## 2018-10-19 MED ORDER — OXALIPLATIN CHEMO INJECTION 100 MG/20ML
65.0000 mg/m2 | Freq: Once | INTRAVENOUS | Status: AC
  Administered 2018-10-19: 115 mg via INTRAVENOUS
  Filled 2018-10-19: qty 20

## 2018-10-19 MED ORDER — SODIUM CHLORIDE 0.9 % IV SOLN
Freq: Once | INTRAVENOUS | Status: AC
  Administered 2018-10-19: 10:00:00 via INTRAVENOUS
  Filled 2018-10-19: qty 250

## 2018-10-19 NOTE — Patient Instructions (Addendum)
Please start Afrin twice a day for 3 days along with simultaneous Flonase twice a day.  Continue Flonase after completing the course of Afrin.  This will help her sinuses and help resolve infection.  Please stop Sudafed.  I have sent prescription for Tessalon for your cough.  You can also continue dextromethorphan/Robitussin DM and guaifenesin/Mucinex for cough.  Please ensure you are drinking plenty of fluids to help thin your secretions.  If symptoms not improved in 2-3 days please contact clinic.  For your back pain, I have sent prescription for Flexeril for muscle spasms.  Start Tylenol for pain.  Try to stand and walk every hour you are awake.  If pain persists or is unrelieved please contact clinic or follow-up with your PCP.  Please take senna and/or MiraLAX 1-3 times a day every day to avoid constipation.  If you have diarrhea please reduce or stop stool softeners.  Ensure you are drinking plenty of fluids, eating fruits and vegetables, and being active as this reduces the risk of constipation.  If despite taking stool softeners, you do not have a bowel movement for 2 days, please take a laxative such as mag citrate and contact clinic for further instructions.  The goal is to have at least one bowel movement every day or every other day without pain or straining.

## 2018-10-19 NOTE — Patient Instructions (Signed)
Fluorouracil, 5-FU injection What is this medicine? FLUOROURACIL, 5-FU (flure oh YOOR a sil) is a chemotherapy drug. It slows the growth of cancer cells. This medicine is used to treat many types of cancer like breast cancer, colon or rectal cancer, pancreatic cancer, and stomach cancer. This medicine may be used for other purposes; ask your health care provider or pharmacist if you have questions. COMMON BRAND NAME(S): Adrucil What should I tell my health care provider before I take this medicine? They need to know if you have any of these conditions: -blood disorders -dihydropyrimidine dehydrogenase (DPD) deficiency -infection (especially a virus infection such as chickenpox, cold sores, or herpes) -kidney disease -liver disease -malnourished, poor nutrition -recent or ongoing radiation therapy -an unusual or allergic reaction to fluorouracil, other chemotherapy, other medicines, foods, dyes, or preservatives -pregnant or trying to get pregnant -breast-feeding How should I use this medicine? This drug is given as an infusion or injection into a vein. It is administered in a hospital or clinic by a specially trained health care professional. Talk to your pediatrician regarding the use of this medicine in children. Special care may be needed. Overdosage: If you think you have taken too much of this medicine contact a poison control center or emergency room at once. NOTE: This medicine is only for you. Do not share this medicine with others. What if I miss a dose? It is important not to miss your dose. Call your doctor or health care professional if you are unable to keep an appointment. What may interact with this medicine? -allopurinol -cimetidine -dapsone -digoxin -hydroxyurea -leucovorin -levamisole -medicines for seizures like ethotoin, fosphenytoin, phenytoin -medicines to increase blood counts like filgrastim, pegfilgrastim, sargramostim -medicines that treat or prevent blood  clots like warfarin, enoxaparin, and dalteparin -methotrexate -metronidazole -pyrimethamine -some other chemotherapy drugs like busulfan, cisplatin, estramustine, vinblastine -trimethoprim -trimetrexate -vaccines Talk to your doctor or health care professional before taking any of these medicines: -acetaminophen -aspirin -ibuprofen -ketoprofen -naproxen This list may not describe all possible interactions. Give your health care provider a list of all the medicines, herbs, non-prescription drugs, or dietary supplements you use. Also tell them if you smoke, drink alcohol, or use illegal drugs. Some items may interact with your medicine. What should I watch for while using this medicine? Visit your doctor for checks on your progress. This drug may make you feel generally unwell. This is not uncommon, as chemotherapy can affect healthy cells as well as cancer cells. Report any side effects. Continue your course of treatment even though you feel ill unless your doctor tells you to stop. In some cases, you may be given additional medicines to help with side effects. Follow all directions for their use. Call your doctor or health care professional for advice if you get a fever, chills or sore throat, or other symptoms of a cold or flu. Do not treat yourself. This drug decreases your body's ability to fight infections. Try to avoid being around people who are sick. This medicine may increase your risk to bruise or bleed. Call your doctor or health care professional if you notice any unusual bleeding. Be careful brushing and flossing your teeth or using a toothpick because you may get an infection or bleed more easily. If you have any dental work done, tell your dentist you are receiving this medicine. Avoid taking products that contain aspirin, acetaminophen, ibuprofen, naproxen, or ketoprofen unless instructed by your doctor. These medicines may hide a fever. Do not become pregnant while taking this  medicine. Women should inform their doctor if they wish to become pregnant or think they might be pregnant. There is a potential for serious side effects to an unborn child. Talk to your health care professional or pharmacist for more information. Do not breast-feed an infant while taking this medicine. Men should inform their doctor if they wish to father a child. This medicine may lower sperm counts. Do not treat diarrhea with over the counter products. Contact your doctor if you have diarrhea that lasts more than 2 days or if it is severe and watery. This medicine can make you more sensitive to the sun. Keep out of the sun. If you cannot avoid being in the sun, wear protective clothing and use sunscreen. Do not use sun lamps or tanning beds/booths. What side effects may I notice from receiving this medicine? Side effects that you should report to your doctor or health care professional as soon as possible: -allergic reactions like skin rash, itching or hives, swelling of the face, lips, or tongue -low blood counts - this medicine may decrease the number of white blood cells, red blood cells and platelets. You may be at increased risk for infections and bleeding. -signs of infection - fever or chills, cough, sore throat, pain or difficulty passing urine -signs of decreased platelets or bleeding - bruising, pinpoint red spots on the skin, black, tarry stools, blood in the urine -signs of decreased red blood cells - unusually weak or tired, fainting spells, lightheadedness -breathing problems -changes in vision -chest pain -mouth sores -nausea and vomiting -pain, swelling, redness at site where injected -pain, tingling, numbness in the hands or feet -redness, swelling, or sores on hands or feet -stomach pain -unusual bleeding Side effects that usually do not require medical attention (report to your doctor or health care professional if they continue or are bothersome): -changes in finger or  toe nails -diarrhea -dry or itchy skin -hair loss -headache -loss of appetite -sensitivity of eyes to the light -stomach upset -unusually teary eyes This list may not describe all possible side effects. Call your doctor for medical advice about side effects. You may report side effects to FDA at 1-800-FDA-1088. Where should I keep my medicine? This drug is given in a hospital or clinic and will not be stored at home. NOTE: This sheet is a summary. It may not cover all possible information. If you have questions about this medicine, talk to your doctor, pharmacist, or health care provider.  2019 Elsevier/Gold Standard (2008-02-10 13:53:16) Dehydration, Adult  Dehydration is when there is not enough fluid or water in your body. This happens when you lose more fluids than you take in. Dehydration can range from mild to very bad. It should be treated right away to keep it from getting very bad. Symptoms of mild dehydration may include:  Thirst.  Dry lips.  Slightly dry mouth.  Dry, warm skin.  Dizziness. Symptoms of moderate dehydration may include:  Very dry mouth.  Muscle cramps.  Dark pee (urine). Pee may be the color of tea.  Your body making less pee.  Your eyes making fewer tears.  Heartbeat that is uneven or faster than normal (palpitations).  Headache.  Light-headedness, especially when you stand up from sitting.  Fainting (syncope). Symptoms of very bad dehydration may include:  Changes in skin, such as: ? Cold and clammy skin. ? Blotchy (mottled) or pale skin. ? Skin that does not quickly return to normal after being lightly pinched and let go (poor  skin turgor).  Changes in body fluids, such as: ? Feeling very thirsty. ? Your eyes making fewer tears. ? Not sweating when body temperature is high, such as in hot weather. ? Your body making very little pee.  Changes in vital signs, such as: ? Weak pulse. ? Pulse that is more than 100 beats a minute  when you are sitting still. ? Fast breathing. ? Low blood pressure.  Other changes, such as: ? Sunken eyes. ? Cold hands and feet. ? Confusion. ? Lack of energy (lethargy). ? Trouble waking up from sleep. ? Short-term weight loss. ? Unconsciousness. Follow these instructions at home:   If told by your doctor, drink an ORS: ? Make an ORS by using instructions on the package. ? Start by drinking small amounts, about  cup (120 mL) every 5-10 minutes. ? Slowly drink more until you have had the amount that your doctor said to have.  Drink enough clear fluid to keep your pee clear or pale yellow. If you were told to drink an ORS, finish the ORS first, then start slowly drinking clear fluids. Drink fluids such as: ? Water. Do not drink only water by itself. Doing that can make the salt (sodium) level in your body get too low (hyponatremia). ? Ice chips. ? Fruit juice that you have added water to (diluted). ? Low-calorie sports drinks.  Avoid: ? Alcohol. ? Drinks that have a lot of sugar. These include high-calorie sports drinks, fruit juice that does not have water added, and soda. ? Caffeine. ? Foods that are greasy or have a lot of fat or sugar.  Take over-the-counter and prescription medicines only as told by your doctor.  Do not take salt tablets. Doing that can make the salt level in your body get too high (hypernatremia).  Eat foods that have minerals (electrolytes). Examples include bananas, oranges, potatoes, tomatoes, and spinach.  Keep all follow-up visits as told by your doctor. This is important. Contact a doctor if:  You have belly (abdominal) pain that: ? Gets worse. ? Stays in one area (localizes).  You have a rash.  You have a stiff neck.  You get angry or annoyed more easily than normal (irritability).  You are more sleepy than normal.  You have a harder time waking up than normal.  You feel: ? Weak. ? Dizzy. ? Very thirsty.  You have peed  (urinated) only a small amount of very dark pee during 6-8 hours. Get help right away if:  You have symptoms of very bad dehydration.  You cannot drink fluids without throwing up (vomiting).  Your symptoms get worse with treatment.  You have a fever.  You have a very bad headache.  You are throwing up or having watery poop (diarrhea) and it: ? Gets worse. ? Does not go away.  You have blood or something green (bile) in your throw-up.  You have blood in your poop (stool). This may cause poop to look black and tarry.  You have not peed in 6-8 hours.  You pass out (faint).  Your heart rate when you are sitting still is more than 100 beats a minute.  You have trouble breathing. This information is not intended to replace advice given to you by your health care provider. Make sure you discuss any questions you have with your health care provider. Document Released: 08/03/2009 Document Revised: 04/26/2016 Document Reviewed: 12/01/2015 Elsevier Interactive Patient Education  2019 [place]. Leucovorin injection What is this medicine? LEUCOVORIN (loo  koe VOR in) is used to prevent or treat the harmful effects of some medicines. This medicine is used to treat anemia caused by a low amount of folic acid in the body. It is also used with 5-fluorouracil (5-FU) to treat colon cancer. This medicine may be used for other purposes; ask your health care provider or pharmacist if you have questions. What should I tell my health care provider before I take this medicine? They need to know if you have any of these conditions: -anemia from low levels of vitamin B-12 in the blood -an unusual or allergic reaction to leucovorin, folic acid, other medicines, foods, dyes, or preservatives -pregnant or trying to get pregnant -breast-feeding How should I use this medicine? This medicine is for injection into a muscle or into a vein. It is given by a health care professional in a hospital or clinic  setting. Talk to your pediatrician regarding the use of this medicine in children. Special care may be needed. Overdosage: If you think you have taken too much of this medicine contact a poison control center or emergency room at once. NOTE: This medicine is only for you. Do not share this medicine with others. What if I miss a dose? This does not apply. What may interact with this medicine? -capecitabine -fluorouracil -phenobarbital -phenytoin -primidone -trimethoprim-sulfamethoxazole This list may not describe all possible interactions. Give your health care provider a list of all the medicines, herbs, non-prescription drugs, or dietary supplements you use. Also tell them if you smoke, drink alcohol, or use illegal drugs. Some items may interact with your medicine. What should I watch for while using this medicine? Your condition will be monitored carefully while you are receiving this medicine. This medicine may increase the side effects of 5-fluorouracil, 5-FU. Tell your doctor or health care professional if you have diarrhea or mouth sores that do not get better or that get worse. What side effects may I notice from receiving this medicine? Side effects that you should report to your doctor or health care professional as soon as possible: -allergic reactions like skin rash, itching or hives, swelling of the face, lips, or tongue -breathing problems -fever, infection -mouth sores -unusual bleeding or bruising -unusually weak or tired Side effects that usually do not require medical attention (report to your doctor or health care professional if they continue or are bothersome): -constipation or diarrhea -loss of appetite -nausea, vomiting This list may not describe all possible side effects. Call your doctor for medical advice about side effects. You may report side effects to FDA at 1-800-FDA-1088. Where should I keep my medicine? This drug is given in a hospital or clinic and  will not be stored at home. NOTE: This sheet is a summary. It may not cover all possible information. If you have questions about this medicine, talk to your doctor, pharmacist, or health care provider.  2019 Elsevier/Gold Standard (2008-04-12 16:50:29) Oxaliplatin Injection What is this medicine? OXALIPLATIN (ox AL i PLA tin) is a chemotherapy drug. It targets fast dividing cells, like cancer cells, and causes these cells to die. This medicine is used to treat cancers of the colon and rectum, and many other cancers. This medicine may be used for other purposes; ask your health care provider or pharmacist if you have questions. COMMON BRAND NAME(S): Eloxatin What should I tell my health care provider before I take this medicine? They need to know if you have any of these conditions: -kidney disease -an unusual or allergic reaction to  oxaliplatin, other chemotherapy, other medicines, foods, dyes, or preservatives -pregnant or trying to get pregnant -breast-feeding How should I use this medicine? This drug is given as an infusion into a vein. It is administered in a hospital or clinic by a specially trained health care professional. Talk to your pediatrician regarding the use of this medicine in children. Special care may be needed. Overdosage: If you think you have taken too much of this medicine contact a poison control center or emergency room at once. NOTE: This medicine is only for you. Do not share this medicine with others. What if I miss a dose? It is important not to miss a dose. Call your doctor or health care professional if you are unable to keep an appointment. What may interact with this medicine? -medicines to increase blood counts like filgrastim, pegfilgrastim, sargramostim -probenecid -some antibiotics like amikacin, gentamicin, neomycin, polymyxin B, streptomycin, tobramycin -zalcitabine Talk to your doctor or health care professional before taking any of these  medicines: -acetaminophen -aspirin -ibuprofen -ketoprofen -naproxen This list may not describe all possible interactions. Give your health care provider a list of all the medicines, herbs, non-prescription drugs, or dietary supplements you use. Also tell them if you smoke, drink alcohol, or use illegal drugs. Some items may interact with your medicine. What should I watch for while using this medicine? Your condition will be monitored carefully while you are receiving this medicine. You will need important blood work done while you are taking this medicine. This medicine can make you more sensitive to cold. Do not drink cold drinks or use ice. Cover exposed skin before coming in contact with cold temperatures or cold objects. When out in cold weather wear warm clothing and cover your mouth and nose to warm the air that goes into your lungs. Tell your doctor if you get sensitive to the cold. This drug may make you feel generally unwell. This is not uncommon, as chemotherapy can affect healthy cells as well as cancer cells. Report any side effects. Continue your course of treatment even though you feel ill unless your doctor tells you to stop. In some cases, you may be given additional medicines to help with side effects. Follow all directions for their use. Call your doctor or health care professional for advice if you get a fever, chills or sore throat, or other symptoms of a cold or flu. Do not treat yourself. This drug decreases your body's ability to fight infections. Try to avoid being around people who are sick. This medicine may increase your risk to bruise or bleed. Call your doctor or health care professional if you notice any unusual bleeding. Be careful brushing and flossing your teeth or using a toothpick because you may get an infection or bleed more easily. If you have any dental work done, tell your dentist you are receiving this medicine. Avoid taking products that contain aspirin,  acetaminophen, ibuprofen, naproxen, or ketoprofen unless instructed by your doctor. These medicines may hide a fever. Do not become pregnant while taking this medicine. Women should inform their doctor if they wish to become pregnant or think they might be pregnant. There is a potential for serious side effects to an unborn child. Talk to your health care professional or pharmacist for more information. Do not breast-feed an infant while taking this medicine. Call your doctor or health care professional if you get diarrhea. Do not treat yourself. What side effects may I notice from receiving this medicine? Side effects that you should  report to your doctor or health care professional as soon as possible: -allergic reactions like skin rash, itching or hives, swelling of the face, lips, or tongue -low blood counts - This drug may decrease the number of white blood cells, red blood cells and platelets. You may be at increased risk for infections and bleeding. -signs of infection - fever or chills, cough, sore throat, pain or difficulty passing urine -signs of decreased platelets or bleeding - bruising, pinpoint red spots on the skin, black, tarry stools, nosebleeds -signs of decreased red blood cells - unusually weak or tired, fainting spells, lightheadedness -breathing problems -chest pain, pressure -cough -diarrhea -jaw tightness -mouth sores -nausea and vomiting -pain, swelling, redness or irritation at the injection site -pain, tingling, numbness in the hands or feet -problems with balance, talking, walking -redness, blistering, peeling or loosening of the skin, including inside the mouth -trouble passing urine or change in the amount of urine Side effects that usually do not require medical attention (report to your doctor or health care professional if they continue or are bothersome): -changes in vision -constipation -hair loss -loss of appetite -metallic taste in the mouth or changes  in taste -stomach pain This list may not describe all possible side effects. Call your doctor for medical advice about side effects. You may report side effects to FDA at 1-800-FDA-1088. Where should I keep my medicine? This drug is given in a hospital or clinic and will not be stored at home. NOTE: This sheet is a summary. It may not cover all possible information. If you have questions about this medicine, talk to your doctor, pharmacist, or health care provider.  2019 Elsevier/Gold Standard (2008-05-03 17:22:47)

## 2018-10-19 NOTE — Progress Notes (Signed)
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 She was recently found to have iron deficiency anemia which did not get better despite taking iron supplements. She underwent colonoscopy by Dr. Alice Reichert in August 2019 which showed a partially obstructing mass in the distal ascending colon. This was biopsied and was consistent with moderately differentiated adenocarcinoma. Patient also had a CT abdomen and pelvis with contrast in August 2019 which showed a 3.2 cm constricting apple core type ascending colon cancer near the hepatic flexure. Small pericolonic lymph nodes but no overt adenopathy. 3.5 mm left hepatic lobe lesion which was eventually characterized as hemangioma on MRI. CT chest showed small subcentimeter pulmonary nodules which were nonspecific.  Patient underwent right hemicolectomy on 07/06/2018 which showed moderately differentiated adenocarcinoma with invasion through the visceral peritoneum. Metastatic carcinoma in 1 of 44 regional lymph nodes. Margins were negative. No lymphovascular invasion or perineural invasion was identified. No tumor deposits identified. pT4a pN1aMSI stable.Cycle 1 of adjuvant FOLFOX chemotherapy started on 08/03/2018.   Overall has tolerated treatment well except for diarrhea. 5-FU bolus was dropped at cycle 5 d/t diarrhea.   Interval history-  Patient returns to clinic today for assessment prior to cycle 6 of adjuvant FOLFOX chemotherapy. In interim, she was complaining of nasal congestion and headache for greater than 1 week.  She was given prescription for doxycycline and reports symptoms did not improve with prescription.  She has been taking sudaphed and aleve without improvement. Completed doxycycline on 10/17/18. Diarrhea is well controlled.  She continues to report fatigue. Complains of back pain today. Intermittent constipation. Has been taking senna without improvement in symptoms.   ECOG PS- 1 Pain scale- 8 Opioid associated constipation- no  Review of systems- Review of Systems  Constitutional: Positive for malaise/fatigue. Negative for chills, fever and weight loss.  HENT: Positive for congestion. Negative for ear discharge and nosebleeds.   Eyes: Negative for blurred vision.  Respiratory: Negative for cough, hemoptysis, sputum production, shortness of breath and wheezing.   Cardiovascular: Negative for chest pain, palpitations, orthopnea and claudication.  Gastrointestinal: Negative for abdominal pain, blood in stool, constipation, diarrhea, heartburn, melena, nausea and vomiting.  Genitourinary: Negative for dysuria, flank pain, frequency, hematuria and urgency.  Musculoskeletal: Positive for back pain. Negative for joint pain and myalgias.  Skin: Negative for rash.  Neurological: Negative for dizziness, tingling, focal weakness, seizures, weakness and headaches.  Endo/Heme/Allergies: Does not bruise/bleed easily.  Psychiatric/Behavioral: Negative for depression and suicidal ideas. The patient does not have insomnia.      Allergies  Allergen Reactions  . Biaxin [Clarithromycin] Shortness Of Breath  . Levofloxacin     Muscle pain  . Penicillins Rash    Has patient had a PCN reaction causing immediate rash, facial/tongue/throat swelling, SOB or lightheadedness with hypotension: no Has patient had a PCN reaction  causing severe rash involving mucus membranes or skin necrosis: no Has patient had a PCN reaction that required hospitalization: no Has patient had a PCN reaction  occurring within the last 10 years: no If all of the above answers are "NO", then may proceed with Cephalosporin use.     Past Medical History:  Diagnosis Date  . Arthritis    hips  . B12 deficiency   . Colon cancer ([place]) 07/06/2018  . 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 COPD (chronic obstructive pulmonary disease) ([place])   . Fibrocystic breast disease   . Mediastinal mass    removed 1/16  . Motion sickness    fair rides  . Osteoporosis   . Schizophrenia ([place])   . Vitamin D deficiency   . Wears dentures    partial upper and lower    Past Surgical History:  Procedure Laterality Date  . BREAST BIOPSY Left    neg  . BREAST BIOPSY Right 05/04/13   [place] bx/clip-neg  . COLONOSCOPY    . COLONOSCOPY WITH PROPOFOL N/A 06/10/2018   Procedure: COLONOSCOPY WITH PROPOFOL;  Surgeon: Toledo, [name], MD;  Location: [facility];  Service: Gastroenterology;  Laterality: N/A;  . ESOPHAGOGASTRODUODENOSCOPY N/A 06/10/2018   Procedure: ESOPHAGOGASTRODUODENOSCOPY (EGD);  Surgeon: Toledo, [name], MD;  Location: [facility];  Service: Gastroenterology;  Laterality: N/A;  . HALLUX VALGUS AKIN Right 11/20/2016   Procedure: HALLUX VALGUS AKIN;  Surgeon: Samara Deist, DPM;  Location: [place];  Service: Podiatry;  Laterality: Right;  . HALLUX VALGUS AUSTIN Right 11/20/2016   Procedure: HALLUX VALGUS AUSTIN  Alroy Dust) right;  Surgeon: Samara Deist, DPM;  Location: [place];  Service: Podiatry;  Laterality: Right;  IVA with Popliteal  . HALLUX VALGUS AUSTIN Left 01/15/2017   Procedure: HALLUX VALGUS AUSTIN  Correction left foot  Iva Popiteal;  Surgeon: Samara Deist, DPM;  Location: [place];  Service: Podiatry;  Laterality: Left;  IVA Popliteal   . LAPAROSCOPIC RIGHT COLECTOMY N/A 07/06/2018   Procedure:  LAPAROSCOPIC COLECTOMY;  Surgeon: Benjamine Sprague, DO;  Location: [facility];  Service: General;  Laterality: N/A;  . MEDIASTINAL MASS EXCISION Left 11/09/2014   Dr. Wynelle Cleveland, [place]  . PORTACATH PLACEMENT Right 07/30/2018   Procedure: INSERTION PORT-A-CATH;  Surgeon: Benjamine Sprague, DO;  Location: [facility];  Service: General;  Laterality: Right;  . THORACOSCOPY Left 11/09/2014   with excision mediastinal mass  . TUBAL LIGATION      Social History   Socioeconomic History  . Marital status: Widowed    Spouse name: Not on file  . Number of children: Not on file  . Years of education: Not on file  . Highest education level: Not on file  Occupational History  . Not on file  Social Needs  . Financial resource strain: Not on file  . Food insecurity:    Worry: Not on file    Inability: Not on file  . Transportation needs:    Medical: Not on file    Non-medical: Not on file  Tobacco Use  . Smoking status: Former Smoker    Last attempt to quit: 10/18/2014    Years since quitting: 4.0  . Smokeless tobacco: Never Used  Substance and Sexual Activity  . Alcohol use: No  . Drug use: Never  . Sexual activity: Not Currently  Lifestyle  . Physical activity:    Days per week: Not on file    Minutes per session: Not on file  . Stress: Not on file  Relationships  . Social connections:    Talks on phone: Not on file    Gets together: Not on file  Attends religious service: Not on file    Active member of club or organization: Not on file    Attends meetings of clubs or organizations: Not on file    Relationship status: Not on file  . Intimate partner violence:    Fear of current or ex partner: Not on file    Emotionally abused: Not on file    Physically abused: Not on file    Forced sexual activity: Not on file  Other Topics Concern  . Not on file  Social History Narrative  . Not on file    Family History  Problem Relation Age of Onset  . Cancer Father   . Breast cancer Neg Hx       Current Outpatient Medications:  .  ASPIRIN 81 PO, Take 81 mg by mouth daily. , Disp: , Rfl:  .  Cholecalciferol (VITAMIN D3) 2000 units TABS, Take 2,000 Units by mouth daily. , Disp: , Rfl:  .  conjugated estrogens (PREMARIN) vaginal cream, Place 1 Applicatorful vaginally 2 (two) times a week. , Disp: , Rfl:  .  ibuprofen (ADVIL,MOTRIN) 800 MG tablet, Take 1 tablet (800 mg total) by mouth every 8 (eight) hours as needed for mild pain or moderate pain., Disp: 30 tablet, Rfl: 0 .  lidocaine-prilocaine (EMLA) cream, APPLY EXTERNALLY TO THE AFFECTED AREA 1 TIME, Disp: 30 g, Rfl: 0 .  LORazepam (ATIVAN) 0.5 MG tablet, Take 1 tablet (0.5 mg total) by mouth every 6 (six) hours as needed (Nausea or vomiting)., Disp: 30 tablet, Rfl: 0 .  magic mouthwash w/lidocaine SOLN, Take 10 mLs by mouth 4 (four) times daily. (Patient taking differently: Take 10 mLs by mouth 4 (four) times daily as needed. ), Disp: 480 mL, Rfl: 3 .  Menthol-Camphor (ICY HOT ADVANCED RELIEF) 16-11 % CREA, Apply 1 Dose topically 3 (three) times daily as needed., Disp: , Rfl:  .  naproxen sodium (ALEVE) 220 MG tablet, Take 220 mg by mouth daily as needed (for back pain)., Disp: , Rfl:  .  Phenyleph-Doxylamine-DM-APAP (ALKA-SELTZER PLS ALLERGY & CGH PO), Take 1 Dose by mouth 2 (two) times daily as needed., Disp: , Rfl:  .  PROCYCLIDINE HCL PO, Take 5 mg by mouth 3 (three) times daily. (Kemadrine) , Disp: , Rfl:  .  Pseudoeph-Doxylamine-DM-APAP (NYQUIL PO), Take 1 Dose by mouth 2 (two) times daily as needed., Disp: , Rfl:  .  pseudoephedrine (SUDAFED) 120 MG 12 hr tablet, Take 120 mg by mouth 2 (two) times daily., Disp: , Rfl:  .  thiothixene (NAVANE) 5 MG capsule, Take 10 mg by mouth at bedtime. , Disp: , Rfl:  .  vitamin B-12 (CYANOCOBALAMIN) 1000 MCG tablet, Take 1,000 mcg by mouth daily. , Disp: , Rfl:  .  benzonatate (TESSALON) 100 MG capsule, Take 1 capsule (100 mg total) by mouth 3 (three) times daily as needed for cough.,  Disp: 60 capsule, Rfl: 0 .  cyclobenzaprine (FLEXERIL) 5 MG tablet, Take 1 tablet (5 mg total) by mouth 3 (three) times daily as needed for muscle spasms., Disp: 30 tablet, Rfl: 0 .  dexamethasone (DECADRON) 4 MG tablet, Take 2 tablets (8 mg total) by mouth daily. Start the day after chemotherapy for 2 days. Take with food. (Patient not taking: Reported on 10/19/2018), Disp: 30 tablet, Rfl: 1 .  diphenoxylate-atropine (LOMOTIL) 2.5-0.025 MG tablet, Take 1 tablet by mouth 4 (four) times daily as needed for diarrhea or loose stools. (Patient not taking: Reported on 10/19/2018), Disp: 30 tablet, Rfl:  0 .  doxycycline (VIBRA-TABS) 100 MG tablet, Take 1 tablet (100 mg total) by mouth 2 (two) times daily., Disp: 10 tablet, Rfl: 0 .  nystatin (MYCOSTATIN) 100000 UNIT/ML suspension, Take 5 mLs (500,000 Units total) by mouth 4 (four) times daily. (Patient not taking: Reported on 09/28/2018), Disp: 60 mL, Rfl: 1 .  ondansetron (ZOFRAN) 8 MG tablet, Take 1 tablet (8 mg total) by mouth 2 (two) times daily as needed for refractory nausea / vomiting. Start on day 3 after chemotherapy. (Patient not taking: Reported on 07/30/2018), Disp: 30 tablet, Rfl: 1 .  polyethylene glycol (MIRALAX / GLYCOLAX) packet, Take 17 g by mouth daily as needed. , Disp: , Rfl:  .  potassium chloride SA (K-DUR,KLOR-CON) 20 MEQ tablet, TAKE 2 TABLET BY MOUTH TWICE DAILY (Patient not taking: Reported on 10/05/2018), Disp: 360 tablet, Rfl: 0 .  prochlorperazine (COMPAZINE) 10 MG tablet, Take 1 tablet (10 mg total) by mouth every 6 (six) hours as needed (Nausea or vomiting). (Patient not taking: Reported on 09/28/2018), Disp: 30 tablet, Rfl: 1 .  senna (SENOKOT) 8.6 MG tablet, Take 1 tablet by mouth daily as needed. , Disp: , Rfl:  No current facility-administered medications for this visit.   Facility-Administered Medications Ordered in Other Visits:  .  fluorouracil (ADRUCIL) 4,300 mg in sodium chloride 0.9 % 64 mL chemo infusion, 2,400 mg/m2  (Treatment Plan Recorded), Intravenous, 1 day or 1 dose, Sindy Guadeloupe, MD .  heparin lock flush 100 unit/mL, 500 Units, Intravenous, Once, Randa Evens C, MD .  heparin lock flush 100 unit/mL, 500 Units, Intracatheter, Once PRN, Sindy Guadeloupe, MD .  leucovorin 700 mg in dextrose 5 % 250 mL infusion, 700 mg, Intravenous, Once, Sindy Guadeloupe, MD, Last Rate: 143 mL/hr at 10/19/18 1028, 700 mg at 10/19/18 1028 .  oxaliplatin (ELOXATIN) 115 mg in dextrose 5 % 500 mL chemo infusion, 65 mg/m2 (Treatment Plan Recorded), Intravenous, Once, Sindy Guadeloupe, MD, Last Rate: 262 mL/hr at 10/19/18 1030, 115 mg at 10/19/18 1030 .  sodium chloride flush (NS) 0.9 % injection 10 mL, 10 mL, Intravenous, PRN, Sindy Guadeloupe, MD, 10 mL at 10/05/18 0845 .  sodium chloride flush (NS) 0.9 % injection 10 mL, 10 mL, Intracatheter, PRN, Sindy Guadeloupe, MD, 10 mL at 10/19/18 4287  Physical exam:  Vitals:   10/19/18 0900  BP: 132/81  Pulse: 91  Resp: 18  Temp: 97.7 F (36.5 C)  TempSrc: Tympanic  Weight: 151 lb 10.8 oz (68.8 kg)  Height: _0  (1.626 m)   Physical Exam Constitutional:      General: She is not in acute distress.    Comments: Accompanied by son   HENT:     Head: Normocephalic and atraumatic.     Right Ear: There is impacted cerumen.     Left Ear: There is impacted cerumen.     Nose: Congestion and rhinorrhea present.     Mouth/Throat:     Pharynx: No oropharyngeal exudate or posterior oropharyngeal erythema.  Eyes:     Pupils: Pupils are equal, round, and reactive to light.  Neck:     Musculoskeletal: Normal range of motion.  Cardiovascular:     Rate and Rhythm: Normal rate and regular rhythm.     Heart sounds: Normal heart sounds.  Pulmonary:     Effort: Pulmonary effort is normal. No respiratory distress.     Breath sounds: Rhonchi present.  Abdominal:     General: Bowel sounds are normal.  Palpations: Abdomen is soft.  Skin:    General: Skin is warm and dry.  Neurological:      Mental Status: She is alert and oriented to person, place, and time.  Psychiatric:        Mood and Affect: Mood is anxious.      CMP Latest Ref Rng & Units 10/19/2018  Glucose 70 - 99 mg/dL 90  BUN 8 - 23 mg/dL 21  Creatinine 0.44 - 1.00 mg/dL 0.69  Sodium 135 - 145 mmol/L 141  Potassium 3.5 - 5.1 mmol/L 3.7  Chloride 98 - 111 mmol/L 107  CO2 22 - 32 mmol/L 27  Calcium 8.9 - 10.3 mg/dL 8.9  Total Protein 6.5 - 8.1 g/dL 5.8(L)  Total Bilirubin 0.3 - 1.2 mg/dL 0.3  Alkaline Phos 38 - 126 U/L 125  AST 15 - 41 U/L 26  ALT 0 - 44 U/L 25   CBC Latest Ref Rng & Units 10/19/2018  WBC 4.0 - 10.5 K/uL 5.6  Hemoglobin 12.0 - 15.0 g/dL 10.8(L)  Hematocrit 36.0 - 46.0 % 33.3(L)  Platelets 150 - 400 K/uL 194    No results found.   Assessment and plan- Patient is a 72 y.o. female h/ostage IIIb adenocarcinoma of the colonpT4apN1a cM0status post hemicolectomy. He is here for on treatment assessment prior to cycle 6 of adjuvant FOLFOX chemotherapy.  1.  Counts acceptable today to proceed with cycle 6 of adjuvant FOLFOX chemotherapy.  Given her history of significant chemo induced diarrhea, 5-FU bolus has been held.  She will only receive leucovorin, infusional 5-FU chemotherapy, and oxaliplatin today.  ANC today 2.5 therefore, she will not require Udenyca.  2.  She will rtc in 3 days to discontinue pump. See Dr. Mike Gip in 2 weeks for labs and consideration of cycle 7 of infusional 5-FU chemotherapy, leucovorin, and oxaliplatin.  Again discussed that plan is to complete 12 cycles of adjuvant chemotherapy if possible followed by imaging.  3.  URI-again suspect viral etiology is no improvement with doxycycline.  Today I prescribed Afrin twice a day for 3 days along with simultaneous Flonase twice a day with the expectation to continue the Flonase after completing the course of Afrin.  Explained that this is to help open up the sinuses, improve outflow, and help resolve the infection.  We  discussed the pathophysiology of how edema from viruses can cause sinus obstruction and secondary bacterial infection, and ultimately ventilating the sinuses is critical to improving and overcoming the infection.  I prescribed Tessalon for cough and recommended she continue dextromethorphan and guaifenesin and increase her fluid intake.  Given rhonchi on exam, if no improvement in symptoms in next 3 days would plan for chest x-ray and possible respiratory fluoroquinolone plus or minus steroids.  4.  Back pain-rated 8 of 10 which is significantly worse than previous.  She has a history of degenerative changes with anterior spurs and sclerosis.  I suspect that given URI she has been more sedentary and this may have exacerbated her back pain.  Advised to start Tylenol for pain and Flexeril.  Continue OTC topical anesthetics.  5.  Constipation-intermittent constipation.  Not on opiates.  Suspect related to poor oral intake and low activity level.  Advised MiraLAX and senna 1-3 times a day to prevent constipation and magnesium citrate if no bowel movement for greater than 48 hours.  Discussed that goal is to have bowel movement every day or every other day without pain or straining.  6.  Chemotherapy-induced anemia  and thrombocytopenia-mild.  She was found to have iron deficiency in 07/2018 and received 2 doses of Feraheme.  Hemoglobin 10.8, hematocrit 33.3.  Ferritin 260.  TIBC 309.  Continue to monitor.  7.  Protein levels continue to be low.  Albumin today 3.1-improved.  Encouraged her to continue to increase her oral intake including protein intake.  Continue Ensure to improve nutritional intake 1-2 times per day.  8.  Chemotherapy-induced diarrhea-resolved with holding of 5-FU bolus.  Previously discussed her notifying clinic and possible IV fluids with Imodium with each bowel movement.  We previously discussed plan to give her octreotide if her diarrhea did not resolve but could again consider it in the  future if needed.  9.  UTI-urine culture showed Citrobacter and Enterobacteriaceae which was treated with bactrim. Resolved.    Visit Diagnosis 1. Malignant neoplasm of ascending colon ([place])     Beckey Rutter, DNP, AGNP-C [place] [facility] [place] (work cell) [phone] (office)  Cc: Dr. Janese Banks

## 2018-10-19 NOTE — Progress Notes (Signed)
Patient states that she still has cold sx. She has no fever, no color to sputum. She is trying sudafed and nyquil off and on and still sx. Cont. She finished doxycycline on sat. and it did not go away with that either. She has lowe back pain and it is right in spine line area. She is taking aleve and icy hot.

## 2018-10-22 ENCOUNTER — Inpatient Hospital Stay: Payer: Medicare Other | Attending: Oncology

## 2018-10-22 ENCOUNTER — Ambulatory Visit
Admission: RE | Admit: 2018-10-22 | Discharge: 2018-10-22 | Disposition: A | Discharge status: Home / Self Care | Payer: Medicare Other | Attending: Nurse Practitioner | Admitting: Nurse Practitioner

## 2018-10-22 ENCOUNTER — Ambulatory Visit
Admission: RE | Admit: 2018-10-22 | Discharge: 2018-10-22 | Disposition: A | Discharge status: Home / Self Care | Payer: Medicare Other | Source: Ambulatory Visit | Attending: Nurse Practitioner | Admitting: Nurse Practitioner

## 2018-10-22 ENCOUNTER — Other Ambulatory Visit: Payer: Self-pay | Admitting: Oncology

## 2018-10-22 VITALS — BP 93/58 | HR 105 | Temp 96.9°F | Resp 20

## 2018-10-22 DIAGNOSIS — E559 Vitamin D deficiency, unspecified: Secondary | ICD-10-CM | POA: Insufficient documentation

## 2018-10-22 DIAGNOSIS — R05 Cough: Secondary | ICD-10-CM

## 2018-10-22 DIAGNOSIS — C182 Malignant neoplasm of ascending colon: Secondary | ICD-10-CM | POA: Insufficient documentation

## 2018-10-22 DIAGNOSIS — Z79899 Other long term (current) drug therapy: Secondary | ICD-10-CM | POA: Diagnosis not present

## 2018-10-22 DIAGNOSIS — R911 Solitary pulmonary nodule: Secondary | ICD-10-CM | POA: Insufficient documentation

## 2018-10-22 DIAGNOSIS — D6481 Anemia due to antineoplastic chemotherapy: Secondary | ICD-10-CM | POA: Diagnosis not present

## 2018-10-22 DIAGNOSIS — E860 Dehydration: Secondary | ICD-10-CM | POA: Diagnosis not present

## 2018-10-22 DIAGNOSIS — T451X5A Adverse effect of antineoplastic and immunosuppressive drugs, initial encounter: Secondary | ICD-10-CM | POA: Insufficient documentation

## 2018-10-22 DIAGNOSIS — M129 Arthropathy, unspecified: Secondary | ICD-10-CM | POA: Diagnosis not present

## 2018-10-22 DIAGNOSIS — R059 Cough, unspecified: Secondary | ICD-10-CM

## 2018-10-22 DIAGNOSIS — Z87891 Personal history of nicotine dependence: Secondary | ICD-10-CM | POA: Diagnosis not present

## 2018-10-22 DIAGNOSIS — C787 Secondary malignant neoplasm of liver and intrahepatic bile duct: Secondary | ICD-10-CM | POA: Insufficient documentation

## 2018-10-22 DIAGNOSIS — Z95828 Presence of other vascular implants and grafts: Secondary | ICD-10-CM

## 2018-10-22 DIAGNOSIS — Z7982 Long term (current) use of aspirin: Secondary | ICD-10-CM | POA: Insufficient documentation

## 2018-10-22 DIAGNOSIS — M62830 Muscle spasm of back: Secondary | ICD-10-CM | POA: Diagnosis not present

## 2018-10-22 DIAGNOSIS — R42 Dizziness and giddiness: Secondary | ICD-10-CM | POA: Insufficient documentation

## 2018-10-22 DIAGNOSIS — M810 Age-related osteoporosis without current pathological fracture: Secondary | ICD-10-CM | POA: Diagnosis not present

## 2018-10-22 DIAGNOSIS — F209 Schizophrenia, unspecified: Secondary | ICD-10-CM | POA: Diagnosis not present

## 2018-10-22 DIAGNOSIS — J449 Chronic obstructive pulmonary disease, unspecified: Secondary | ICD-10-CM | POA: Insufficient documentation

## 2018-10-22 DIAGNOSIS — E538 Deficiency of other specified B group vitamins: Secondary | ICD-10-CM | POA: Insufficient documentation

## 2018-10-22 DIAGNOSIS — Z5111 Encounter for antineoplastic chemotherapy: Secondary | ICD-10-CM | POA: Diagnosis not present

## 2018-10-22 DIAGNOSIS — E876 Hypokalemia: Secondary | ICD-10-CM | POA: Diagnosis not present

## 2018-10-22 DIAGNOSIS — K121 Other forms of stomatitis: Secondary | ICD-10-CM | POA: Diagnosis not present

## 2018-10-22 MED ORDER — SODIUM CHLORIDE 0.9% FLUSH
10.0000 mL | INTRAVENOUS | Status: DC | PRN
  Administered 2018-10-22: 10 mL via INTRAVENOUS
  Filled 2018-10-22: qty 10

## 2018-10-22 MED ORDER — HEPARIN SOD (PORK) LOCK FLUSH 100 UNIT/ML IV SOLN
500.0000 [IU] | Freq: Once | INTRAVENOUS | Status: AC
  Administered 2018-10-22: 500 [IU] via INTRAVENOUS

## 2018-10-22 MED ORDER — PREDNISONE 10 MG (21) PO TBPK: ORAL_TABLET | ORAL | 0 refills | Status: DC

## 2018-10-22 NOTE — Progress Notes (Signed)
Patient arrived this morning stating that the NP said she could get a chest xray and an antibiotic if she was not improved.  Patient stated that she was dizzy this morning and has arrived in a wheelchair being pushed by her son.  Bp 113/69, HR 88 and Temp 96.9 while sitting.  Bp 93/58 and HR 105 while standing.  Patient reports that she is still having sputum, does not feel any better, and would like an xray.  NP Beckey Rutter notified.  She orderd a stat chest xray and will call patient with results. Patient and son verbalized understanding that they were to go across the hall for the xray then the NP will call with results and any prescriptions that are needed.

## 2018-10-31 DIAGNOSIS — Z5111 Encounter for antineoplastic chemotherapy: Secondary | ICD-10-CM | POA: Insufficient documentation

## 2018-10-31 NOTE — Progress Notes (Signed)
[facility] day:  11/02/2018  Chief Complaint: Kathy [name] is a 73 y.o. female with stage IIIB colon cancer who is seen for new patient assessment and cycle #7 FOLFOX chemotherapy.  HPI:  The patient presented with iron deficiency.  Colonoscopy on 06/10/2018 by Dr. Alice Reichert revealed a partially obstructing mass in the distal ascending colon.  Biopsy confirmed moderately differentiated adenocarcinoma.  Abdomen and pelvis CT on 06/17/2018 revealed a 3.2 cm constricting apple core type ascending colon cancer near the hepatic flexure.  There were small pericolonic lymph nodes but no overt adenopathy.  There was a 3.5 mm left hepatic lobe lesion characterized as hemangioma on MRI.  Chest CT on 06/17/2018 revealed small subcentimeter pulmonary nodules, nonspecific.  She underwent right hemicolectomy on 07/06/2018 by Dr Benjamine Sprague.  Pathology revealed a moderately differentiated adenocarcinoma with invasion through the visceral peritoneum.  There was metastatic carcinoma in 1 of 44 regional lymph nodes.  Margins were negative.  There was no lymphovascular invasion or perineural invasion was identified.  There were no tumor deposits identified. Pathologic stage was pT4a pN1a.  MSI was stable.    She began cycle #1 of adjuvant FOLFOX chemotherapy on 08/03/2018.  Because of her age and frail status, her oxaliplatin was reduced to 65 mg/m2.  She has received 6 cycles to date.  She was last seen by Dr Janese Banks on 10/05/2018.  At that time, she received cycle #5 FOLFOX chemotherapy.  She had significant chemotherapy induced diarrhea, thus her 5-FU bolus was deleted.  She only receives leucovorin and infusional 5-FU chemotherapy.  She has mild chemotherapy induced anemia.  She has a history of iron deficiency requiring IV iron in the past.  Albumin was 2.7.  Ensure 1-2 times a day to improve her nutritional intake was suggested.  Urine culture revealed Citrobacter and  Enterobacteriaceae, sensitive to Bactrim.  She was gven 5 days of Bactrim on 09/30/2018.  She has not required Neulasta or Udencya.  She was seen by Beckey Rutter, NP on 10/19/2018.  She received cycle #6 FOLFOX.  She was prescribed Afrin and Flonase for URI symptoms.  B12 was 617 and folate 18.1 on 08/17/2018.  Ferritin was 260 with an iron saturation of 34% and a TIBC of 309 on 10/19/2018.  Symptomatically, patient has had significant oral "sensitivity" for which she uses Magic Mouthwash. She has significant "spasms" in her back for which she has been using PRN cyclobenzaprine.  She feels dizzy on standing.  She is eating well.  She is supplementing her diet with Boost.   Past Medical History:  Diagnosis Date  . Arthritis    hips  . B12 deficiency   . Colon cancer ([place]) 07/06/2018  . 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 COPD (chronic obstructive pulmonary disease) ([place])   . Fibrocystic breast disease   . Mediastinal mass    removed 1/16  . Motion sickness    fair rides  . Osteoporosis   . Schizophrenia ([place])   . Vitamin D deficiency   . Wears dentures    partial upper and lower    Past Surgical History:  Procedure Laterality Date  . BREAST BIOPSY Left    neg  . BREAST BIOPSY Right 05/04/13   [place] bx/clip-neg  . COLONOSCOPY    . COLONOSCOPY WITH PROPOFOL N/A 06/10/2018   Procedure: COLONOSCOPY WITH PROPOFOL;  Surgeon: Toledo, [name], MD;  Location: [facility];  Service: Gastroenterology;  Laterality: N/A;  .  ESOPHAGOGASTRODUODENOSCOPY N/A 06/10/2018   Procedure: ESOPHAGOGASTRODUODENOSCOPY (EGD);  Surgeon: Toledo, [name], MD;  Location: [facility];  Service: Gastroenterology;  Laterality: N/A;  . HALLUX VALGUS AKIN Right 11/20/2016   Procedure: HALLUX VALGUS AKIN;  Surgeon: Samara Deist, DPM;  Location: [place];  Service: Podiatry;  Laterality: Right;  . HALLUX VALGUS AUSTIN Right 11/20/2016   Procedure: HALLUX VALGUS AUSTIN  Alroy Dust) right;  Surgeon:  Samara Deist, DPM;  Location: [place];  Service: Podiatry;  Laterality: Right;  IVA with Popliteal  . HALLUX VALGUS AUSTIN Left 01/15/2017   Procedure: HALLUX VALGUS AUSTIN  Correction left foot  Iva Popiteal;  Surgeon: Samara Deist, DPM;  Location: [place];  Service: Podiatry;  Laterality: Left;  IVA Popliteal   . LAPAROSCOPIC RIGHT COLECTOMY N/A 07/06/2018   Procedure: LAPAROSCOPIC COLECTOMY;  Surgeon: Benjamine Sprague, DO;  Location: [facility];  Service: General;  Laterality: N/A;  . MEDIASTINAL MASS EXCISION Left 11/09/2014   Dr. Wynelle Cleveland, [place]  . PORTACATH PLACEMENT Right 07/30/2018   Procedure: INSERTION PORT-A-CATH;  Surgeon: Benjamine Sprague, DO;  Location: [facility];  Service: General;  Laterality: Right;  . THORACOSCOPY Left 11/09/2014   with excision mediastinal mass  . TUBAL LIGATION      Family History  Problem Relation Age of Onset  . Cancer Father   . Breast cancer Neg Hx     Social History:  reports that she quit smoking about 4 years ago. She has never used smokeless tobacco. She reports that she does not drink alcohol or use drugs.  The patient is accompanied by by her son, [name], today.  Allergies:  Allergies  Allergen Reactions  . Biaxin [Clarithromycin] Shortness Of Breath  . Levofloxacin     Muscle pain  . Penicillins Rash    Has patient had a PCN reaction causing immediate rash, facial/tongue/throat swelling, SOB or lightheadedness with hypotension: no Has patient had a PCN reaction causing severe rash involving mucus membranes or skin necrosis: no Has patient had a PCN reaction that required hospitalization: no Has patient had a PCN reaction occurring within the last 10 years: no If all of the above answers are "NO", then may proceed with Cephalosporin use.     Current Medications: Current Outpatient Medications  Medication Sig Dispense Refill  . ASPIRIN 81 PO Take 81 mg by mouth daily.     . Cholecalciferol (VITAMIN D3) 2000 units TABS  Take 2,000 Units by mouth daily.     . cyclobenzaprine (FLEXERIL) 5 MG tablet Take 1 tablet (5 mg total) by mouth 3 (three) times daily as needed for muscle spasms. 30 tablet 0  . magic mouthwash w/lidocaine SOLN Take 10 mLs by mouth 4 (four) times daily. (Patient taking differently: Take 10 mLs by mouth 4 (four) times daily as needed. ) 480 mL 3  . PROCYCLIDINE HCL PO Take 5 mg by mouth 3 (three) times daily. [facility])     . thiothixene (NAVANE) 5 MG capsule Take 10 mg by mouth at bedtime.     . vitamin B-12 (CYANOCOBALAMIN) 1000 MCG tablet Take 1,000 mcg by mouth daily.     . benzonatate (TESSALON) 100 MG capsule Take 1 capsule (100 mg total) by mouth 3 (three) times daily as needed for cough. (Patient not taking: Reported on 11/02/2018) 60 capsule 0  . conjugated estrogens (PREMARIN) vaginal cream Place 1 Applicatorful vaginally 2 (two) times a week.     Marland Kitchen dexamethasone (DECADRON) 4 MG tablet Take 2 tablets (8 mg total)  by mouth daily. Start the day after chemotherapy for 2 days. Take with food. (Patient not taking: Reported on 10/19/2018) 30 tablet 1  . diphenoxylate-atropine (LOMOTIL) 2.5-0.025 MG tablet Take 1 tablet by mouth 4 (four) times daily as needed for diarrhea or loose stools. (Patient not taking: Reported on 10/19/2018) 30 tablet 0  . ibuprofen (ADVIL,MOTRIN) 800 MG tablet Take 1 tablet (800 mg total) by mouth every 8 (eight) hours as needed for mild pain or moderate pain. (Patient not taking: Reported on 11/02/2018) 30 tablet 0  . lidocaine-prilocaine (EMLA) cream APPLY EXTERNALLY TO THE AFFECTED AREA 1 TIME (Patient not taking: Reported on 11/02/2018) 30 g 0  . LORazepam (ATIVAN) 0.5 MG tablet Take 1 tablet (0.5 mg total) by mouth every 6 (six) hours as needed (Nausea or vomiting). (Patient not taking: Reported on 11/02/2018) 30 tablet 0  . Menthol-Camphor (ICY HOT ADVANCED RELIEF) 16-11 % CREA Apply 1 Dose topically 3 (three) times daily as needed.    . naproxen sodium (ALEVE) 220 MG  tablet Take 220 mg by mouth daily as needed (for back pain).    . nystatin (MYCOSTATIN) 100000 UNIT/ML suspension Take 5 mLs (500,000 Units total) by mouth 4 (four) times daily. (Patient not taking: Reported on 09/28/2018) 60 mL 1  . ondansetron (ZOFRAN) 8 MG tablet Take 1 tablet (8 mg total) by mouth 2 (two) times daily as needed for refractory nausea / vomiting. Start on day 3 after chemotherapy. (Patient not taking: Reported on 07/30/2018) 30 tablet 1  . Phenyleph-Doxylamine-DM-APAP (ALKA-SELTZER PLS ALLERGY & CGH PO) Take 1 Dose by mouth 2 (two) times daily as needed.    . polyethylene glycol (MIRALAX / GLYCOLAX) packet Take 17 g by mouth daily as needed.     . potassium chloride SA (K-DUR,KLOR-CON) 20 MEQ tablet TAKE 2 TABLET BY MOUTH TWICE DAILY (Patient not taking: Reported on 10/05/2018) 360 tablet 0  . predniSONE (STERAPRED UNI-PAK 21 TAB) 10 MG (21) TBPK tablet Take as directed (Patient not taking: Reported on 11/02/2018) 21 tablet 0  . prochlorperazine (COMPAZINE) 10 MG tablet Take 1 tablet (10 mg total) by mouth every 6 (six) hours as needed (Nausea or vomiting). (Patient not taking: Reported on 09/28/2018) 30 tablet 1  . Pseudoeph-Doxylamine-DM-APAP (NYQUIL PO) Take 1 Dose by mouth 2 (two) times daily as needed.    . pseudoephedrine (SUDAFED) 120 MG 12 hr tablet Take 120 mg by mouth 2 (two) times daily.    Marland Kitchen senna (SENOKOT) 8.6 MG tablet Take 1 tablet by mouth daily as needed.      No current facility-administered medications for this visit.    Facility-Administered Medications Ordered in Other Visits  Medication Dose Route Frequency Provider Last Rate Last Dose  . heparin lock flush 100 unit/mL  500 Units Intravenous Once Sindy Guadeloupe, MD      . sodium chloride flush (NS) 0.9 % injection 10 mL  10 mL Intravenous PRN Sindy Guadeloupe, MD   10 mL at 10/05/18 0845    Review of Systems:  GENERAL:  Feels good.  Active.  No fevers, sweats or weight loss. PERFORMANCE STATUS (ECOG):   1-2 HEENT:  Mouth sensitive, using magic mouthwash.  No visual changes, runny nose, sore throat, mouth sores or tenderness. Lungs: No shortness of breath or cough.  No hemoptysis. Cardiac:  No chest pain, palpitations, orthopnea, or PND. GI:  Eating well.  No nausea, vomiting, diarrhea, constipation, melena or hematochezia. GU:  No urgency, frequency, dysuria, or hematuria. Musculoskeletal:  No back pain.  No joint pain.  No muscle tenderness. Extremities:  No pain or swelling. Skin:  No rashes or skin changes. Neuro:  Dizzy on standing.  No headache, numbness or weakness, balance or coordination issues. Endocrine:  No diabetes, thyroid issues, hot flashes or night sweats. Psych:  No mood changes, depression or anxiety. Pain:  No focal pain. Review of systems:  All other systems reviewed and found to be negative.  Physical Exam: Blood pressure (!) 110/50, pulse 79, temperature (!) 97.5 F (36.4 C), temperature source Tympanic, resp. rate 18, height _0  (1.626 m), weight 144 lb 15.2 oz (65.8 kg), SpO2 98 %. GENERAL:  Well developed, well nourished, woman sitting comfortably in the exam room in no acute distress. MENTAL STATUS:  Alert and oriented to person, place and time. HEAD:  Graying brown hair.  Normocephalic, atraumatic, face symmetric, no Cushingoid features. EYES:  Blue eyes.  Pupils equal round and reactive to light and accomodation.  No conjunctivitis or scleral icterus. ENT:  Oropharynx clear with 3 small ulcers.  Tongue normal. Mucous membranes moist.  RESPIRATORY:  Clear to auscultation without rales, wheezes or rhonchi. CARDIOVASCULAR:  Regular rate and rhythm without murmur, rub or gallop. ABDOMEN:  Soft, non-tender, with active bowel sounds, and no hepatosplenomegaly.  No masses. SKIN:  No rashes, ulcers or lesions. EXTREMITIES: No edema, no skin discoloration or tenderness.  No palpable cords. LYMPH NODES: No palpable cervical, supraclavicular, axillary or inguinal  adenopathy  NEUROLOGICAL: Unremarkable. PSYCH:  Appropriate.   Infusion on 11/02/2018  Component Date Value Ref Range Status  . Sodium 11/02/2018 140  135 - 145 mmol/L Final  . Potassium 11/02/2018 3.2* 3.5 - 5.1 mmol/L Final  . Chloride 11/02/2018 105  98 - 111 mmol/L Final  . CO2 11/02/2018 26  22 - 32 mmol/L Final  . Glucose, Bld 11/02/2018 95  70 - 99 mg/dL Final  . BUN 11/02/2018 17  8 - 23 mg/dL Final  . Creatinine, Ser 11/02/2018 0.68  0.44 - 1.00 mg/dL Final  . Calcium 11/02/2018 8.9  8.9 - 10.3 mg/dL Final  . Total Protein 11/02/2018 5.7* 6.5 - 8.1 g/dL Final  . Albumin 11/02/2018 3.1* 3.5 - 5.0 g/dL Final  . AST 11/02/2018 22  15 - 41 U/L Final  . ALT 11/02/2018 22  0 - 44 U/L Final  . Alkaline Phosphatase 11/02/2018 94  38 - 126 U/L Final  . Total Bilirubin 11/02/2018 0.5  0.3 - 1.2 mg/dL Final  . GFR calc non Af Amer 11/02/2018 >60  >60 mL/min Final  . GFR calc Af Amer 11/02/2018 >60  >60 mL/min Final  . Anion gap 11/02/2018 9  5 - 15 Final   Performed at [facility], [address], [place], [place] [address]  . WBC 11/02/2018 6.7  4.0 - 10.5 K/uL Final  . RBC 11/02/2018 3.44* 3.87 - 5.11 MIL/uL Final  . Hemoglobin 11/02/2018 11.3* 12.0 - 15.0 g/dL Final  . HCT 11/02/2018 34.5* 36.0 - 46.0 % Final  . MCV 11/02/2018 100.3* 80.0 - 100.0 fL Final  . MCH 11/02/2018 32.8  26.0 - 34.0 pg Final  . MCHC 11/02/2018 32.8  30.0 - 36.0 g/dL Final  . RDW 11/02/2018 19.3* 11.5 - 15.5 % Final  . Platelets 11/02/2018 163  150 - 400 K/uL Final  . nRBC 11/02/2018 0.0  0.0 - 0.2 % Final  . Neutrophils Relative % 11/02/2018 61  % Final  . Neutro Abs 11/02/2018 4.0  1.7 - 7.7 K/uL Final  . Lymphocytes Relative 11/02/2018 22  % Final  . Lymphs Abs 11/02/2018 1.5  0.7 - 4.0 K/uL Final  . Monocytes Relative 11/02/2018 16  % Final  . Monocytes Absolute 11/02/2018 1.1* 0.1 - 1.0 K/uL Final  . Eosinophils Relative 11/02/2018 0  % Final  . Eosinophils Absolute 11/02/2018 0.0   0.0 - 0.5 K/uL Final  . Basophils Relative 11/02/2018 0  % Final  . Basophils Absolute 11/02/2018 0.0  0.0 - 0.1 K/uL Final  . Immature Granulocytes 11/02/2018 1  % Final  . Abs Immature Granulocytes 11/02/2018 0.05  0.00 - 0.07 K/uL Final   Performed at [facility], [address] [place], [place] [address]    Assessment:  Kathy [name] is a 73 y.o. female with stage IIIB ascending colon cancer s/p hemicolectomy on 07/06/2018.  Pathology revealed a moderately differentiated adenocarcinoma with invasion through the visceral peritoneum.  There was metastatic carcinoma in 1 of 44 regional lymph nodes.  Margins were negative.  There was no lymphovascular invasion or perineural invasion was identified.  There were no tumor deposits identified. Pathologic stage was pT4a pN1a.  MSI was stable.  CEA was 3.0 on 08/03/2018  Abdomen and pelvis CT on 06/17/2018 revealed a 3.2 cm constricting apple core type ascending colon cancer near the hepatic flexure.  There were small pericolonic lymph nodes but no overt adenopathy.  There was a 3.5 mm left hepatic lobe lesion characterized as hemangioma on MRI.  Chest CT on 06/17/2018 revealed small subcentimeter pulmonary nodules, nonspecific.  She is s/p 6 cycles of FOLFOX chemotherapy (08/03/2018 - 10/19/2018).  Because of her age and frail status, oxaliplatin was reduced to 65 mg/m2.   She had significant chemotherapy induced diarrhea, thus her 5-FU bolus was deleted after cycle #4.    She has mild chemotherapy induced anemia.  She has a history of iron deficiency requiring IV iron in the past.  B12 was 617 and folate 18.1 on 08/17/2018.  Ferritin was 260 with an iron saturation of 34% and a TIBC of 309 on 10/19/2018.  She has schizophrenia.  Symptomatically, patient has significant oral "sensitivity" for which she uses Magic Mouthwash. She has significant "spasms" in her back for which she has been using PRN cyclobenzaprine.  She feels dizzy on  standing.  Exam is unremarkable.  Plan: 1.  Labs today: CBC with diff, CMP, Mg. 2.  Stage IIIB ascending colon cancer:  Review entire medical history and treatment to date.  Patient is s/p 6 cycles of modified FOLFOX chemotherapy.  She does not receive bolus 5FU.  Her oxaliplatin is reduced to 65 mg/m2.  Discuss postponing chemotherapy today secondary to dizziness and oral ulcerations. 3.  Dehydration and hypokalemia:  Check orthostatics.  IVF and potassium 20 meq IV today. 4.  Oral ulcers:  Continue Magic Mouthwash.  Rx:  Magic mouthwash. 5.  Back spasms:  Refill cyclobenzaprine. 6.  RTC in 1 week for MD assessment, labs (CBC with diff, CMP, Mg), and cycle #7 FOLFOX chemotherapy.   Honor Loh, NP  11/02/2018, 9:42 AM  I saw and evaluated the patient, participating in the key portions of the service and reviewing pertinent diagnostic studies and records.  I reviewed the nurse practitioner's note and agree with the findings and the plan.  The assessment and plan were discussed with the patient. Multiple questions were asked by the patient and answered.   Nolon Stalls, MD 11/02/2018,9:42 AM

## 2018-11-02 ENCOUNTER — Inpatient Hospital Stay: Payer: Medicare Other

## 2018-11-02 ENCOUNTER — Inpatient Hospital Stay (HOSPITAL_BASED_OUTPATIENT_CLINIC_OR_DEPARTMENT_OTHER): Payer: Medicare Other | Admitting: Hematology and Oncology

## 2018-11-02 ENCOUNTER — Encounter: Payer: Self-pay | Admitting: Hematology and Oncology

## 2018-11-02 VITALS — BP 110/50 | HR 79 | Temp 97.5°F | Resp 18 | Ht 64.0 in | Wt 145.0 lb

## 2018-11-02 DIAGNOSIS — C787 Secondary malignant neoplasm of liver and intrahepatic bile duct: Secondary | ICD-10-CM | POA: Diagnosis not present

## 2018-11-02 DIAGNOSIS — M129 Arthropathy, unspecified: Secondary | ICD-10-CM

## 2018-11-02 DIAGNOSIS — E876 Hypokalemia: Secondary | ICD-10-CM

## 2018-11-02 DIAGNOSIS — R911 Solitary pulmonary nodule: Secondary | ICD-10-CM

## 2018-11-02 DIAGNOSIS — Z79899 Other long term (current) drug therapy: Secondary | ICD-10-CM

## 2018-11-02 DIAGNOSIS — E538 Deficiency of other specified B group vitamins: Secondary | ICD-10-CM

## 2018-11-02 DIAGNOSIS — Z87891 Personal history of nicotine dependence: Secondary | ICD-10-CM

## 2018-11-02 DIAGNOSIS — R42 Dizziness and giddiness: Secondary | ICD-10-CM

## 2018-11-02 DIAGNOSIS — E559 Vitamin D deficiency, unspecified: Secondary | ICD-10-CM

## 2018-11-02 DIAGNOSIS — D6481 Anemia due to antineoplastic chemotherapy: Secondary | ICD-10-CM

## 2018-11-02 DIAGNOSIS — Z5111 Encounter for antineoplastic chemotherapy: Secondary | ICD-10-CM

## 2018-11-02 DIAGNOSIS — T451X5A Adverse effect of antineoplastic and immunosuppressive drugs, initial encounter: Secondary | ICD-10-CM

## 2018-11-02 DIAGNOSIS — J449 Chronic obstructive pulmonary disease, unspecified: Secondary | ICD-10-CM

## 2018-11-02 DIAGNOSIS — C182 Malignant neoplasm of ascending colon: Secondary | ICD-10-CM | POA: Diagnosis not present

## 2018-11-02 DIAGNOSIS — D509 Iron deficiency anemia, unspecified: Secondary | ICD-10-CM

## 2018-11-02 DIAGNOSIS — M62830 Muscle spasm of back: Secondary | ICD-10-CM

## 2018-11-02 DIAGNOSIS — Z7189 Other specified counseling: Secondary | ICD-10-CM

## 2018-11-02 DIAGNOSIS — M810 Age-related osteoporosis without current pathological fracture: Secondary | ICD-10-CM

## 2018-11-02 DIAGNOSIS — E860 Dehydration: Secondary | ICD-10-CM

## 2018-11-02 DIAGNOSIS — F209 Schizophrenia, unspecified: Secondary | ICD-10-CM

## 2018-11-02 DIAGNOSIS — K121 Other forms of stomatitis: Secondary | ICD-10-CM

## 2018-11-02 DIAGNOSIS — Z7982 Long term (current) use of aspirin: Secondary | ICD-10-CM

## 2018-11-02 LAB — COMPREHENSIVE METABOLIC PANEL
ALT: 22 U/L (ref 0–44)
AST: 22 U/L (ref 15–41)
Albumin: 3.1 g/dL — ABNORMAL LOW (ref 3.5–5.0)
Alkaline Phosphatase: 94 U/L (ref 38–126)
Anion gap: 9 (ref 5–15)
BUN: 17 mg/dL (ref 8–23)
CO2: 26 mmol/L (ref 22–32)
Calcium: 8.9 mg/dL (ref 8.9–10.3)
Chloride: 105 mmol/L (ref 98–111)
Creatinine, Ser: 0.68 mg/dL (ref 0.44–1.00)
GFR calc Af Amer: >60 mL/min (ref >60)
GFR calc non Af Amer: >60 mL/min (ref >60)
Glucose, Bld: 95 mg/dL (ref 70–99)
Potassium: 3.2 mmol/L — ABNORMAL LOW (ref 3.5–5.1)
Sodium: 140 mmol/L (ref 135–145)
Total Bilirubin: 0.5 mg/dL (ref 0.3–1.2)
Total Protein: 5.7 g/dL — ABNORMAL LOW (ref 6.5–8.1)

## 2018-11-02 LAB — CBC WITH DIFFERENTIAL/PLATELET
Abs Immature Granulocytes: 0.05 10*3/uL (ref 0.00–0.07)
Basophils Absolute: 0 10*3/uL (ref 0.0–0.1)
Basophils Relative: 0 %
Eosinophils Absolute: 0 10*3/uL (ref 0.0–0.5)
Eosinophils Relative: 0 %
HCT: 34.5 % — ABNORMAL LOW (ref 36.0–46.0)
Hemoglobin: 11.3 g/dL — ABNORMAL LOW (ref 12.0–15.0)
Immature Granulocytes: 1 %
Lymphocytes Relative: 22 %
Lymphs Abs: 1.5 10*3/uL (ref 0.7–4.0)
MCH: 32.8 pg (ref 26.0–34.0)
MCHC: 32.8 g/dL (ref 30.0–36.0)
MCV: 100.3 fL — ABNORMAL HIGH (ref 80.0–100.0)
Monocytes Absolute: 1.1 10*3/uL — ABNORMAL HIGH (ref 0.1–1.0)
Monocytes Relative: 16 %
Neutro Abs: 4 10*3/uL (ref 1.7–7.7)
Neutrophils Relative %: 61 %
Platelets: 163 10*3/uL (ref 150–400)
RBC: 3.44 MIL/uL — ABNORMAL LOW (ref 3.87–5.11)
RDW: 19.3 % — ABNORMAL HIGH (ref 11.5–15.5)
WBC: 6.7 10*3/uL (ref 4.0–10.5)
nRBC: 0 % (ref 0.0–0.2)

## 2018-11-02 LAB — MAGNESIUM: Magnesium: 2.3 mg/dL (ref 1.7–2.4)

## 2018-11-02 MED ORDER — MAGIC MOUTHWASH W/LIDOCAINE: 10.0000 mL | Freq: Four times a day (QID) | ORAL | Status: DC | PRN

## 2018-11-02 MED ORDER — HEPARIN SOD (PORK) LOCK FLUSH 100 UNIT/ML IV SOLN
500.0000 [IU] | Freq: Once | INTRAVENOUS | Status: AC
  Administered 2018-11-02: 500 [IU] via INTRAVENOUS
  Filled 2018-11-02: qty 5

## 2018-11-02 MED ORDER — SODIUM CHLORIDE 0.9% FLUSH
10.0000 mL | INTRAVENOUS | Status: DC | PRN
  Administered 2018-11-02: 10 mL via INTRAVENOUS
  Filled 2018-11-02: qty 10

## 2018-11-02 MED ORDER — CYCLOBENZAPRINE HCL 5 MG PO TABS: 5.0000 mg | ORAL_TABLET | Freq: Three times a day (TID) | ORAL | 0 refills | Status: DC | PRN

## 2018-11-02 MED ORDER — SODIUM CHLORIDE 0.9 % IV SOLN
Freq: Once | INTRAVENOUS | Status: AC
  Administered 2018-11-02: 10:00:00 via INTRAVENOUS
  Filled 2018-11-02: qty 1000

## 2018-11-02 NOTE — Progress Notes (Signed)
After last treatment the patient reports she has been more sensitive x 1 week. She report she has mouth wash but it has not helped this time.

## 2018-11-08 DIAGNOSIS — D6481 Anemia due to antineoplastic chemotherapy: Secondary | ICD-10-CM | POA: Insufficient documentation

## 2018-11-08 DIAGNOSIS — T451X5A Adverse effect of antineoplastic and immunosuppressive drugs, initial encounter: Secondary | ICD-10-CM

## 2018-11-08 NOTE — Progress Notes (Signed)
[facility] day:  11/09/2018  Chief Complaint: Kathy [name] is a 73 y.o. female with stage IIIB colon cancer who is seen for assessment prior to cycle #7 FOLFOX chemotherapy.  HPI:  The patient was last seen in the medical oncology clinic on 11/02/2018 for initial assessment. She had significant oral "sensitivity" for which she uses Magic Mouthwash. She has significant "spasms" in her back for which she has been using PRN cyclobenzaprine.  She felt dizzy on standing.  Exam revealed 3 oral ulcers.  Potassium was 3.2.  She received IVF + potassium.  She continued Magic Mouthwash.  During the interim, she has felt better.  She describes her mouth as "not 100%".  She notes some oral sensitivity on the right lower and left upper side of her mouth.  She states now that she is able to eat "without crying".  She occasionally gets dizzy with standing.  Weight is back to baseline.   Past Medical History:  Diagnosis Date  . Arthritis    hips  . B12 deficiency   . Colon cancer ([place]) 07/06/2018  . 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 COPD (chronic obstructive pulmonary disease) ([place])   . Fibrocystic breast disease   . Mediastinal mass    removed 1/16  . Motion sickness    fair rides  . Osteoporosis   . Schizophrenia ([place])   . Vitamin D deficiency   . Wears dentures    partial upper and lower    Past Surgical History:  Procedure Laterality Date  . BREAST BIOPSY Left    neg  . BREAST BIOPSY Right 05/04/13   [place] bx/clip-neg  . COLONOSCOPY    . COLONOSCOPY WITH PROPOFOL N/A 06/10/2018   Procedure: COLONOSCOPY WITH PROPOFOL;  Surgeon: Toledo, [name], MD;  Location: [facility];  Service: Gastroenterology;  Laterality: N/A;  . ESOPHAGOGASTRODUODENOSCOPY N/A 06/10/2018   Procedure: ESOPHAGOGASTRODUODENOSCOPY (EGD);  Surgeon: Toledo, [name], MD;  Location: [facility];  Service: Gastroenterology;  Laterality: N/A;  . HALLUX VALGUS AKIN Right  11/20/2016   Procedure: HALLUX VALGUS AKIN;  Surgeon: Samara Deist, DPM;  Location: [facility];  Service: Podiatry;  Laterality: Right;  . HALLUX VALGUS AUSTIN Right 11/20/2016   Procedure: HALLUX VALGUS AUSTIN  Alroy Dust) right;  Surgeon: Samara Deist, DPM;  Location: [facility];  Service: Podiatry;  Laterality: Right;  IVA with Popliteal  . HALLUX VALGUS AUSTIN Left 01/15/2017   Procedure: HALLUX VALGUS AUSTIN  Correction left foot  Iva Popiteal;  Surgeon: Samara Deist, DPM;  Location: [facility];  Service: Podiatry;  Laterality: Left;  IVA Popliteal   . LAPAROSCOPIC RIGHT COLECTOMY N/A 07/06/2018   Procedure: LAPAROSCOPIC COLECTOMY;  Surgeon: Benjamine Sprague, DO;  Location: [facility];  Service: General;  Laterality: N/A;  . MEDIASTINAL MASS EXCISION Left 11/09/2014   Dr. Wynelle Cleveland, [place]  . PORTACATH PLACEMENT Right 07/30/2018   Procedure: INSERTION PORT-A-CATH;  Surgeon: Benjamine Sprague, DO;  Location: [facility];  Service: General;  Laterality: Right;  . THORACOSCOPY Left 11/09/2014   with excision mediastinal mass  . TUBAL LIGATION      Family History  Problem Relation Age of Onset  . Cancer Father   . Breast cancer Neg Hx     Social History:  reports that she quit smoking about 4 years ago. She has never used smokeless tobacco. She reports that she does not drink alcohol or use drugs.  The patient is accompanied by by  her son, [name], today.  Allergies:  Allergies  Allergen Reactions  . Biaxin [Clarithromycin] Shortness Of Breath  . Levofloxacin     Muscle pain  . Penicillins Rash    Has patient had a PCN reaction causing immediate rash, facial/tongue/throat swelling, SOB or lightheadedness with hypotension: no Has patient had a PCN reaction causing severe rash involving mucus membranes or skin necrosis: no Has patient had a PCN reaction that required hospitalization: no Has patient had a PCN reaction occurring within the last 10 years: no If all of the above  answers are "NO", then may proceed with Cephalosporin use.     Current Medications: Current Outpatient Medications  Medication Sig Dispense Refill  . ASPIRIN 81 PO Take 81 mg by mouth daily.     . benzonatate (TESSALON) 100 MG capsule Take 1 capsule (100 mg total) by mouth 3 (three) times daily as needed for cough. 60 capsule 0  . Cholecalciferol (VITAMIN D3) 2000 units TABS Take 2,000 Units by mouth daily.     Marland Kitchen conjugated estrogens (PREMARIN) vaginal cream Place 1 Applicatorful vaginally 2 (two) times a week.     . cyclobenzaprine (FLEXERIL) 5 MG tablet Take 1 tablet (5 mg total) by mouth 3 (three) times daily as needed for muscle spasms. 30 tablet 0  . dexamethasone (DECADRON) 4 MG tablet Take 2 tablets (8 mg total) by mouth daily. Start the day after chemotherapy for 2 days. Take with food. 30 tablet 1  . ibuprofen (ADVIL,MOTRIN) 800 MG tablet Take 1 tablet (800 mg total) by mouth every 8 (eight) hours as needed for mild pain or moderate pain. 30 tablet 0  . lidocaine-prilocaine (EMLA) cream APPLY EXTERNALLY TO THE AFFECTED AREA 1 TIME 30 g 0  . LORazepam (ATIVAN) 0.5 MG tablet Take 1 tablet (0.5 mg total) by mouth every 6 (six) hours as needed (Nausea or vomiting). 30 tablet 0  . magic mouthwash w/lidocaine SOLN Take 10 mLs by mouth 4 (four) times daily as needed.    . ondansetron (ZOFRAN) 8 MG tablet Take 1 tablet (8 mg total) by mouth 2 (two) times daily as needed for refractory nausea / vomiting. Start on day 3 after chemotherapy. 30 tablet 1  . polyethylene glycol (MIRALAX / GLYCOLAX) packet Take 17 g by mouth daily as needed.     . Potassium Gluconate 550 MG TABS Take 1 tablet by mouth daily.    Marland Kitchen PROCYCLIDINE HCL PO Take 5 mg by mouth 3 (three) times daily. [facility])     . senna (SENOKOT) 8.6 MG tablet Take 1 tablet by mouth daily as needed.     . thiothixene (NAVANE) 5 MG capsule Take 10 mg by mouth at bedtime.     . vitamin B-12 (CYANOCOBALAMIN) 1000 MCG tablet Take 1,000 mcg by  mouth daily.     . potassium chloride SA (K-DUR,KLOR-CON) 20 MEQ tablet TAKE 2 TABLET BY MOUTH TWICE DAILY (Patient not taking: Reported on 10/05/2018) 360 tablet 0   No current facility-administered medications for this visit.    Facility-Administered Medications Ordered in Other Visits  Medication Dose Route Frequency Provider Last Rate Last Dose  . heparin lock flush 100 unit/mL  500 Units Intravenous Once Sindy Guadeloupe, MD      . heparin lock flush 100 unit/mL  500 Units Intravenous Once Corcoran, Melissa C, MD      . sodium chloride flush (NS) 0.9 % injection 10 mL  10 mL Intravenous PRN Sindy Guadeloupe, MD   10  mL at 10/05/18 0845  . sodium chloride flush (NS) 0.9 % injection 10 mL  10 mL Intravenous PRN Lequita Asal, MD   10 mL at 11/09/18 0840    Review of Systems:  GENERAL:  Feels "ok".  No fevers, sweats.  Weight back to baseline. PERFORMANCE STATUS (ECOG):  1-2 HEENT:  Mouth less sensitive.  No visual changes, runny nose, sore throat, mouth sores or tenderness. Lungs: No shortness of breath or cough.  No hemoptysis. Cardiac:  No chest pain, palpitations, orthopnea, or PND. GI:  Eating well.  No nausea, vomiting, diarrhea, constipation, melena or hematochezia. GU:  No urgency, frequency, dysuria, or hematuria. Musculoskeletal:  No back pain.  No joint pain.  No muscle tenderness. Extremities:  No pain or swelling. Skin:  No rashes or skin changes. Neuro:  Occasionally dizzy on standing.  No headache, numbness or weakness, balance or coordination issues. Endocrine:  No diabetes, thyroid issues, hot flashes or night sweats. Psych:  No mood changes, depression or anxiety. Pain:  No focal pain. Review of systems:  All other systems reviewed and found to be negative.   Physical Exam: Blood pressure 137/75, pulse 79, temperature (!) 97.5 F (36.4 C), temperature source Oral, resp. rate 16, weight 151 lb 9.1 oz (68.8 kg), SpO2 100 %. GENERAL:  Well developed, well  nourished, woman sitting comfortably in the exam room in no acute distress. MENTAL STATUS:  Alert and oriented to person, place and time. HEAD:  Graying short brown hair.  Normocephalic, atraumatic, face symmetric, no Cushingoid features. EYES:  Blue eyes.  Pupils equal round and reactive to light and accomodation.  No conjunctivitis or scleral icterus. ENT:  Oropharynx clear without lesion (ulcers resolved).  Tongue normal. Mucous membranes moist.  RESPIRATORY:  Clear to auscultation without rales, wheezes or rhonchi. CARDIOVASCULAR:  Regular rate and rhythm without murmur, rub or gallop. ABDOMEN:  Soft, non-tender, with active bowel sounds, and no hepatosplenomegaly.  No masses. SKIN:  No rashes, ulcers or lesions. EXTREMITIES: No edema, no skin discoloration or tenderness.  No palpable cords. LYMPH NODES: No palpable cervical, supraclavicular, axillary or inguinal adenopathy  NEUROLOGICAL: Unremarkable. PSYCH:  Appropriate.    Infusion on 11/09/2018  Component Date Value Ref Range Status  . Magnesium 11/09/2018 2.1  1.7 - 2.4 mg/dL Final   Performed at [facility], [address], [place], [place] [address]  . Sodium 11/09/2018 137  135 - 145 mmol/L Final  . Potassium 11/09/2018 3.5  3.5 - 5.1 mmol/L Final  . Chloride 11/09/2018 105  98 - 111 mmol/L Final  . CO2 11/09/2018 23  22 - 32 mmol/L Final  . Glucose, Bld 11/09/2018 90  70 - 99 mg/dL Final  . BUN 11/09/2018 16  8 - 23 mg/dL Final  . Creatinine, Ser 11/09/2018 0.64  0.44 - 1.00 mg/dL Final  . Calcium 11/09/2018 8.8* 8.9 - 10.3 mg/dL Final  . Total Protein 11/09/2018 5.9* 6.5 - 8.1 g/dL Final  . Albumin 11/09/2018 3.2* 3.5 - 5.0 g/dL Final  . AST 11/09/2018 23  15 - 41 U/L Final  . ALT 11/09/2018 25  0 - 44 U/L Final  . Alkaline Phosphatase 11/09/2018 102  38 - 126 U/L Final  . Total Bilirubin 11/09/2018 <0.1* 0.3 - 1.2 mg/dL Final  . GFR calc non Af Amer 11/09/2018 >60  >60 mL/min Final  . GFR calc Af Amer  11/09/2018 >60  >60 mL/min Final  . Anion gap 11/09/2018 9  5 -  15 Final   Performed at [facility], [address], [place], [place] [address]  . WBC 11/09/2018 9.3  4.0 - 10.5 K/uL Final  . RBC 11/09/2018 3.60* 3.87 - 5.11 MIL/uL Final  . Hemoglobin 11/09/2018 12.2  12.0 - 15.0 g/dL Final  . HCT 11/09/2018 36.4  36.0 - 46.0 % Final  . MCV 11/09/2018 101.1* 80.0 - 100.0 fL Final  . MCH 11/09/2018 33.9  26.0 - 34.0 pg Final  . MCHC 11/09/2018 33.5  30.0 - 36.0 g/dL Final  . RDW 11/09/2018 18.1* 11.5 - 15.5 % Final  . Platelets 11/09/2018 241  150 - 400 K/uL Final  . nRBC 11/09/2018 0.0  0.0 - 0.2 % Final  . Neutrophils Relative % 11/09/2018 56  % Final  . Neutro Abs 11/09/2018 5.2  1.7 - 7.7 K/uL Final  . Lymphocytes Relative 11/09/2018 27  % Final  . Lymphs Abs 11/09/2018 2.5  0.7 - 4.0 K/uL Final  . Monocytes Relative 11/09/2018 12  % Final  . Monocytes Absolute 11/09/2018 1.1* 0.1 - 1.0 K/uL Final  . Eosinophils Relative 11/09/2018 1  % Final  . Eosinophils Absolute 11/09/2018 0.1  0.0 - 0.5 K/uL Final  . Basophils Relative 11/09/2018 0  % Final  . Basophils Absolute 11/09/2018 0.0  0.0 - 0.1 K/uL Final  . Immature Granulocytes 11/09/2018 4  % Final  . Abs Immature Granulocytes 11/09/2018 0.35* 0.00 - 0.07 K/uL Final   Performed at [facility], [address] [place], [place] [address]    Assessment:  CLOTHILDE TIPPETTS is a 73 y.o. female with stage IIIB ascending colon cancer s/p hemicolectomy on 07/06/2018.  Pathology revealed a moderately differentiated adenocarcinoma with invasion through the visceral peritoneum.  There was metastatic carcinoma in 1 of 44 regional lymph nodes.  Margins were negative.  There was no lymphovascular invasion or perineural invasion was identified.  There were no tumor deposits identified. Pathologic stage was pT4a pN1a.  MSI was stable.  CEA was 3.0 on 08/03/2018  Abdomen and pelvis CT on 06/17/2018 revealed a 3.2 cm  constricting apple core type ascending colon cancer near the hepatic flexure.  There were small pericolonic lymph nodes but no overt adenopathy.  There was a 3.5 mm left hepatic lobe lesion characterized as hemangioma on MRI.  Chest CT on 06/17/2018 revealed small subcentimeter pulmonary nodules, nonspecific.  She is s/p 6 cycles of FOLFOX chemotherapy (08/03/2018 - 10/19/2018).  Because of her age and frail status, oxaliplatin was reduced to 65 mg/m2.   She had significant chemotherapy induced diarrhea, thus her 5-FU bolus was deleted after cycle #4.    She has mild chemotherapy induced anemia.  She has a history of iron deficiency requiring IV iron in the past.  B12 was 617 and folate 18.1 on 08/17/2018.  Ferritin was 260 with an iron saturation of 34% and a TIBC of 309 on 10/19/2018.  She has schizophrenia.  Symptomatically, she feels better.  She has some residual oral sensitivity.  Weight is back to baseline.  Exam reveals resolution of oral ulcers.    Plan: 1.  Labs today: CBC with diff, CMP, Mg. 2.  Stage IIIB ascending colon cancer:  Discuss plan to reinitiate chemotherapy..  Patient is s/p 6 cycles of modified FOLFOX chemotherapy.  She does not receive bolus 5FU.  Oxaliplatin is reduced to 65 mg/m2.  Cycle #7 mFOLFOX chemotherapy today.  RTC in 2 days for disconnect.  Check nadir counts in 1  week. 3.  Dehydration and hypokalemia:  Resolved. 4.  Oral ulcers:  Resolved.    Patient notes residual oral tenderness.  Continue Magic Mouthwash BID for prophylaxis. 5.  RTC in 1 week for labs (CBC with diff). 6.  RTC in 2 weeks for MD assessment, labs (CBC with diff, CMP, Mg), and cycle #8 FOLFOX chemotherapy.   Lequita Asal, MD  11/09/2018, 9:03 AM

## 2018-11-09 ENCOUNTER — Inpatient Hospital Stay: Payer: Medicare Other

## 2018-11-09 ENCOUNTER — Inpatient Hospital Stay (HOSPITAL_BASED_OUTPATIENT_CLINIC_OR_DEPARTMENT_OTHER): Payer: Medicare Other | Admitting: Hematology and Oncology

## 2018-11-09 ENCOUNTER — Encounter: Payer: Self-pay | Admitting: Hematology and Oncology

## 2018-11-09 VITALS — BP 137/75 | HR 79 | Temp 97.5°F | Resp 16 | Wt 151.6 lb

## 2018-11-09 DIAGNOSIS — Z87891 Personal history of nicotine dependence: Secondary | ICD-10-CM

## 2018-11-09 DIAGNOSIS — J449 Chronic obstructive pulmonary disease, unspecified: Secondary | ICD-10-CM

## 2018-11-09 DIAGNOSIS — Z5111 Encounter for antineoplastic chemotherapy: Secondary | ICD-10-CM

## 2018-11-09 DIAGNOSIS — M810 Age-related osteoporosis without current pathological fracture: Secondary | ICD-10-CM

## 2018-11-09 DIAGNOSIS — E538 Deficiency of other specified B group vitamins: Secondary | ICD-10-CM

## 2018-11-09 DIAGNOSIS — E860 Dehydration: Secondary | ICD-10-CM

## 2018-11-09 DIAGNOSIS — Z7982 Long term (current) use of aspirin: Secondary | ICD-10-CM

## 2018-11-09 DIAGNOSIS — R42 Dizziness and giddiness: Secondary | ICD-10-CM

## 2018-11-09 DIAGNOSIS — D6481 Anemia due to antineoplastic chemotherapy: Secondary | ICD-10-CM

## 2018-11-09 DIAGNOSIS — F209 Schizophrenia, unspecified: Secondary | ICD-10-CM

## 2018-11-09 DIAGNOSIS — C787 Secondary malignant neoplasm of liver and intrahepatic bile duct: Secondary | ICD-10-CM | POA: Diagnosis not present

## 2018-11-09 DIAGNOSIS — C183 Malignant neoplasm of hepatic flexure: Secondary | ICD-10-CM

## 2018-11-09 DIAGNOSIS — R911 Solitary pulmonary nodule: Secondary | ICD-10-CM

## 2018-11-09 DIAGNOSIS — M129 Arthropathy, unspecified: Secondary | ICD-10-CM

## 2018-11-09 DIAGNOSIS — E559 Vitamin D deficiency, unspecified: Secondary | ICD-10-CM

## 2018-11-09 DIAGNOSIS — K121 Other forms of stomatitis: Secondary | ICD-10-CM

## 2018-11-09 DIAGNOSIS — M62830 Muscle spasm of back: Secondary | ICD-10-CM

## 2018-11-09 DIAGNOSIS — C182 Malignant neoplasm of ascending colon: Secondary | ICD-10-CM

## 2018-11-09 DIAGNOSIS — E876 Hypokalemia: Secondary | ICD-10-CM

## 2018-11-09 DIAGNOSIS — T451X5A Adverse effect of antineoplastic and immunosuppressive drugs, initial encounter: Secondary | ICD-10-CM

## 2018-11-09 DIAGNOSIS — Z79899 Other long term (current) drug therapy: Secondary | ICD-10-CM

## 2018-11-09 LAB — MAGNESIUM: Magnesium: 2.1 mg/dL (ref 1.7–2.4)

## 2018-11-09 LAB — COMPREHENSIVE METABOLIC PANEL
ALT: 25 U/L (ref 0–44)
AST: 23 U/L (ref 15–41)
Albumin: 3.2 g/dL — ABNORMAL LOW (ref 3.5–5.0)
Alkaline Phosphatase: 102 U/L (ref 38–126)
Anion gap: 9 (ref 5–15)
BUN: 16 mg/dL (ref 8–23)
CO2: 23 mmol/L (ref 22–32)
Calcium: 8.8 mg/dL — ABNORMAL LOW (ref 8.9–10.3)
Chloride: 105 mmol/L (ref 98–111)
Creatinine, Ser: 0.64 mg/dL (ref 0.44–1.00)
GFR calc Af Amer: >60 mL/min (ref >60)
GFR calc non Af Amer: >60 mL/min (ref >60)
Glucose, Bld: 90 mg/dL (ref 70–99)
Potassium: 3.5 mmol/L (ref 3.5–5.1)
Sodium: 137 mmol/L (ref 135–145)
Total Bilirubin: <0.1 mg/dL — ABNORMAL LOW (ref 0.3–1.2)
Total Protein: 5.9 g/dL — ABNORMAL LOW (ref 6.5–8.1)

## 2018-11-09 LAB — CBC WITH DIFFERENTIAL/PLATELET
Abs Immature Granulocytes: 0.35 10*3/uL — ABNORMAL HIGH (ref 0.00–0.07)
Basophils Absolute: 0 10*3/uL (ref 0.0–0.1)
Basophils Relative: 0 %
Eosinophils Absolute: 0.1 10*3/uL (ref 0.0–0.5)
Eosinophils Relative: 1 %
HCT: 36.4 % (ref 36.0–46.0)
Hemoglobin: 12.2 g/dL (ref 12.0–15.0)
Immature Granulocytes: 4 %
Lymphocytes Relative: 27 %
Lymphs Abs: 2.5 10*3/uL (ref 0.7–4.0)
MCH: 33.9 pg (ref 26.0–34.0)
MCHC: 33.5 g/dL (ref 30.0–36.0)
MCV: 101.1 fL — ABNORMAL HIGH (ref 80.0–100.0)
Monocytes Absolute: 1.1 10*3/uL — ABNORMAL HIGH (ref 0.1–1.0)
Monocytes Relative: 12 %
Neutro Abs: 5.2 10*3/uL (ref 1.7–7.7)
Neutrophils Relative %: 56 %
Platelets: 241 10*3/uL (ref 150–400)
RBC: 3.6 MIL/uL — ABNORMAL LOW (ref 3.87–5.11)
RDW: 18.1 % — ABNORMAL HIGH (ref 11.5–15.5)
WBC: 9.3 10*3/uL (ref 4.0–10.5)
nRBC: 0 % (ref 0.0–0.2)

## 2018-11-09 MED ORDER — HEPARIN SOD (PORK) LOCK FLUSH 100 UNIT/ML IV SOLN: 500.0000 [IU] | Freq: Once | INTRAVENOUS | Status: AC

## 2018-11-09 MED ORDER — LEUCOVORIN CALCIUM INJECTION 350 MG
700.0000 mg | Freq: Once | INTRAVENOUS | Status: AC
  Administered 2018-11-09: 700 mg via INTRAVENOUS
  Filled 2018-11-09: qty 35

## 2018-11-09 MED ORDER — OXALIPLATIN CHEMO INJECTION 100 MG/20ML
65.0000 mg/m2 | Freq: Once | INTRAVENOUS | Status: AC
  Administered 2018-11-09: 115 mg via INTRAVENOUS
  Filled 2018-11-09: qty 20

## 2018-11-09 MED ORDER — DEXAMETHASONE SODIUM PHOSPHATE 10 MG/ML IJ SOLN
10.0000 mg | Freq: Once | INTRAMUSCULAR | Status: AC
  Administered 2018-11-09: 10 mg via INTRAVENOUS
  Filled 2018-11-09: qty 1

## 2018-11-09 MED ORDER — PALONOSETRON HCL INJECTION 0.25 MG/5ML
0.2500 mg | Freq: Once | INTRAVENOUS | Status: AC
  Administered 2018-11-09: 0.25 mg via INTRAVENOUS
  Filled 2018-11-09: qty 5

## 2018-11-09 MED ORDER — DEXTROSE 5 % IV SOLN
Freq: Once | INTRAVENOUS | Status: DC
  Filled 2018-11-09: qty 250

## 2018-11-09 MED ORDER — SODIUM CHLORIDE 0.9 % IV SOLN
2400.0000 mg/m2 | INTRAVENOUS | Status: DC
  Administered 2018-11-09: 4300 mg via INTRAVENOUS
  Filled 2018-11-09: qty 86

## 2018-11-09 MED ORDER — DEXTROSE 5 % IV SOLN
Freq: Once | INTRAVENOUS | Status: AC
  Administered 2018-11-09: 09:00:00 via INTRAVENOUS
  Filled 2018-11-09: qty 250

## 2018-11-09 MED ORDER — SODIUM CHLORIDE 0.9% FLUSH
10.0000 mL | INTRAVENOUS | Status: AC | PRN
  Administered 2018-11-09: 10 mL via INTRAVENOUS
  Filled 2018-11-09: qty 10

## 2018-11-09 NOTE — Patient Instructions (Signed)

## 2018-11-09 NOTE — Progress Notes (Signed)
Pt here for follow up. Denies any concerns at this time. Pt reports mouth sores have improved. Reports some sensitivity to lower right cheek area and upper left. States mouthwash is helping.

## 2018-11-11 ENCOUNTER — Inpatient Hospital Stay: Payer: Medicare Other

## 2018-11-11 DIAGNOSIS — C183 Malignant neoplasm of hepatic flexure: Secondary | ICD-10-CM

## 2018-11-11 DIAGNOSIS — C182 Malignant neoplasm of ascending colon: Secondary | ICD-10-CM | POA: Diagnosis not present

## 2018-11-11 MED ORDER — SODIUM CHLORIDE 0.9% FLUSH
10.0000 mL | INTRAVENOUS | Status: DC | PRN
  Administered 2018-11-11: 10 mL
  Filled 2018-11-11: qty 10

## 2018-11-11 MED ORDER — HEPARIN SOD (PORK) LOCK FLUSH 100 UNIT/ML IV SOLN
500.0000 [IU] | Freq: Once | INTRAVENOUS | Status: AC | PRN
  Administered 2018-11-11: 500 [IU]

## 2018-11-16 ENCOUNTER — Inpatient Hospital Stay: Payer: Medicare Other

## 2018-11-16 DIAGNOSIS — C182 Malignant neoplasm of ascending colon: Secondary | ICD-10-CM

## 2018-11-16 LAB — CBC WITH DIFFERENTIAL/PLATELET
Abs Immature Granulocytes: 0.07 10*3/uL (ref 0.00–0.07)
Basophils Absolute: 0 10*3/uL (ref 0.0–0.1)
Basophils Relative: 0 %
Eosinophils Absolute: 0.2 10*3/uL (ref 0.0–0.5)
Eosinophils Relative: 3 %
HCT: 38.5 % (ref 36.0–46.0)
Hemoglobin: 12.7 g/dL (ref 12.0–15.0)
Immature Granulocytes: 1 %
Lymphocytes Relative: 19 %
Lymphs Abs: 1.5 10*3/uL (ref 0.7–4.0)
MCH: 33.2 pg (ref 26.0–34.0)
MCHC: 33 g/dL (ref 30.0–36.0)
MCV: 100.8 fL — ABNORMAL HIGH (ref 80.0–100.0)
Monocytes Absolute: 0.5 10*3/uL (ref 0.1–1.0)
Monocytes Relative: 6 %
Neutro Abs: 5.5 10*3/uL (ref 1.7–7.7)
Neutrophils Relative %: 71 %
Platelets: 163 10*3/uL (ref 150–400)
RBC: 3.82 MIL/uL — ABNORMAL LOW (ref 3.87–5.11)
RDW: 16.5 % — ABNORMAL HIGH (ref 11.5–15.5)
WBC: 7.8 10*3/uL (ref 4.0–10.5)
nRBC: 0 % (ref 0.0–0.2)

## 2018-11-16 LAB — COMPREHENSIVE METABOLIC PANEL
ALT: 23 U/L (ref 0–44)
AST: 20 U/L (ref 15–41)
Albumin: 3.5 g/dL (ref 3.5–5.0)
Alkaline Phosphatase: 109 U/L (ref 38–126)
Anion gap: 9 (ref 5–15)
BUN: 20 mg/dL (ref 8–23)
CO2: 27 mmol/L (ref 22–32)
Calcium: 9 mg/dL (ref 8.9–10.3)
Chloride: 103 mmol/L (ref 98–111)
Creatinine, Ser: 0.71 mg/dL (ref 0.44–1.00)
GFR calc Af Amer: >60 mL/min (ref >60)
GFR calc non Af Amer: >60 mL/min (ref >60)
Glucose, Bld: 90 mg/dL (ref 70–99)
Potassium: 3.5 mmol/L (ref 3.5–5.1)
Sodium: 139 mmol/L (ref 135–145)
Total Bilirubin: 0.4 mg/dL (ref 0.3–1.2)
Total Protein: 6.6 g/dL (ref 6.5–8.1)

## 2018-11-22 NOTE — Progress Notes (Signed)
[facility] day:  11/23/2018  Chief Complaint: Kathy [name] is a 73 y.o. female with stage IIIB colon cancer who is seen for assessment prior to cycle #8 FOLFOX chemotherapy.  HPI:  The patient was last seen in the medical oncology clinic on 11/09/2018.  At that time, she felt better.  She had some residual oral sensitivity.  Weight was back to baseline.  Exam revealed resolution of oral ulcers.  She received cycle #7 FOLFOX chemotherapy.  CBC on 11/16/2018 revealed a hematocrit of 38.5, hemoglobin 12.7, MCV 100.8, platelets 163,000, WBC 7800 with an [place] of 5500.  During the interim, patient has been experiencing issues with constipation. Patient is on a daily stool softener BID. Her last normal bowel movement was on 11/20/2018.  Additionally, patient has taken a single dose of  Miralax and Senna S, which did not yield any results. Of note, patient has previously experienced diarrhea with her chemotherapy treatments. She denies any associated abdominal pain.   Patient tolerated her last cycle of chemotherapy fairly well. She has previously experienced neuropathy, however denies with this last cycle. Patient denies that she has experienced any B symptoms. She denies any interval infections. Vertiginous symptoms have resolved.   Patient advises that she maintains an adequate appetite. She is eating well. Weight today is 153 lb 10.6 oz (69.7 kg), which compared to her last visit to the clinic, represents a 2 pound increase.   Patient complains of pain rated 7/10 (back) in the clinic today.   Past Medical History:  Diagnosis Date  . Arthritis    hips  . B12 deficiency   . Colon cancer ([place]) 07/06/2018  . 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 COPD (chronic obstructive pulmonary disease) ([place])   . Fibrocystic breast disease   . Mediastinal mass    removed 1/16  . Motion sickness    fair rides  . Osteoporosis   . Schizophrenia ([place])   . Vitamin D  deficiency   . Wears dentures    partial upper and lower    Past Surgical History:  Procedure Laterality Date  . BREAST BIOPSY Left    neg  . BREAST BIOPSY Right 05/04/13   [place] bx/clip-neg  . COLONOSCOPY    . COLONOSCOPY WITH PROPOFOL N/A 06/10/2018   Procedure: COLONOSCOPY WITH PROPOFOL;  Surgeon: Toledo, [name], MD;  Location: [facility];  Service: Gastroenterology;  Laterality: N/A;  . ESOPHAGOGASTRODUODENOSCOPY N/A 06/10/2018   Procedure: ESOPHAGOGASTRODUODENOSCOPY (EGD);  Surgeon: Toledo, [name], MD;  Location: [facility];  Service: Gastroenterology;  Laterality: N/A;  . HALLUX VALGUS AKIN Right 11/20/2016   Procedure: HALLUX VALGUS AKIN;  Surgeon: Samara Deist, DPM;  Location: [facility];  Service: Podiatry;  Laterality: Right;  . HALLUX VALGUS AUSTIN Right 11/20/2016   Procedure: HALLUX VALGUS AUSTIN  Alroy Dust) right;  Surgeon: Samara Deist, DPM;  Location: [facility];  Service: Podiatry;  Laterality: Right;  IVA with Popliteal  . HALLUX VALGUS AUSTIN Left 01/15/2017   Procedure: HALLUX VALGUS AUSTIN  Correction left foot  Iva Popiteal;  Surgeon: Samara Deist, DPM;  Location: [facility];  Service: Podiatry;  Laterality: Left;  IVA Popliteal   . LAPAROSCOPIC RIGHT COLECTOMY N/A 07/06/2018   Procedure: LAPAROSCOPIC COLECTOMY;  Surgeon: Benjamine Sprague, DO;  Location: [facility];  Service: General;  Laterality: N/A;  . MEDIASTINAL MASS EXCISION Left 11/09/2014   Dr. Wynelle Cleveland, [place]  . PORTACATH PLACEMENT Right 07/30/2018   Procedure:  INSERTION PORT-A-CATH;  Surgeon: Benjamine Sprague, DO;  Location: [facility];  Service: General;  Laterality: Right;  . THORACOSCOPY Left 11/09/2014   with excision mediastinal mass  . TUBAL LIGATION      Family History  Problem Relation Age of Onset  . Cancer Father   . Breast cancer Neg Hx     Social History:  reports that she quit smoking about 4 years ago. She has never used smokeless tobacco. She reports that she  does not drink alcohol or use drugs.  Her son's name is Kathy [name].  The patient is alone today.  Allergies:  Allergies  Allergen Reactions  . Biaxin [Clarithromycin] Shortness Of Breath  . Levofloxacin     Muscle pain  . Penicillins Rash    Has patient had a PCN reaction causing immediate rash, facial/tongue/throat swelling, SOB or lightheadedness with hypotension: no Has patient had a PCN reaction causing severe rash involving mucus membranes or skin necrosis: no Has patient had a PCN reaction that required hospitalization: no Has patient had a PCN reaction occurring within the last 10 years: no If all of the above answers are "NO", then may proceed with Cephalosporin use.     Current Medications: Current Outpatient Medications  Medication Sig Dispense Refill  . ASPIRIN 81 PO Take 81 mg by mouth daily.     . Cholecalciferol (VITAMIN D3) 2000 units TABS Take 2,000 Units by mouth daily.     Marland Kitchen conjugated estrogens (PREMARIN) vaginal cream Place 1 Applicatorful vaginally 2 (two) times a week.     . cyclobenzaprine (FLEXERIL) 5 MG tablet Take 1 tablet (5 mg total) by mouth 3 (three) times daily as needed for muscle spasms. 30 tablet 0  . magic mouthwash w/lidocaine SOLN Take 10 mLs by mouth 4 (four) times daily as needed.    . potassium chloride SA (K-DUR,KLOR-CON) 20 MEQ tablet TAKE 2 TABLET BY MOUTH TWICE DAILY 360 tablet 0  . Potassium Gluconate 550 MG TABS Take 1 tablet by mouth daily.    Marland Kitchen PROCYCLIDINE HCL PO Take 5 mg by mouth 3 (three) times daily. [facility])     . senna (SENOKOT) 8.6 MG tablet Take 1 tablet by mouth daily as needed.     . thiothixene (NAVANE) 5 MG capsule Take 10 mg by mouth at bedtime.     . vitamin B-12 (CYANOCOBALAMIN) 1000 MCG tablet Take 1,000 mcg by mouth daily.     . benzonatate (TESSALON) 100 MG capsule Take 1 capsule (100 mg total) by mouth 3 (three) times daily as needed for cough. (Patient not taking: Reported on 11/23/2018) 60 capsule 0  . dexamethasone  (DECADRON) 4 MG tablet Take 2 tablets (8 mg total) by mouth daily. Start the day after chemotherapy for 2 days. Take with food. (Patient not taking: Reported on 11/23/2018) 30 tablet 1  . ibuprofen (ADVIL,MOTRIN) 800 MG tablet Take 1 tablet (800 mg total) by mouth every 8 (eight) hours as needed for mild pain or moderate pain. (Patient not taking: Reported on 11/23/2018) 30 tablet 0  . lidocaine-prilocaine (EMLA) cream APPLY EXTERNALLY TO THE AFFECTED AREA 1 TIME (Patient not taking: Reported on 11/23/2018) 30 g 0  . LORazepam (ATIVAN) 0.5 MG tablet Take 1 tablet (0.5 mg total) by mouth every 6 (six) hours as needed (Nausea or vomiting). (Patient not taking: Reported on 11/23/2018) 30 tablet 0  . ondansetron (ZOFRAN) 8 MG tablet Take 1 tablet (8 mg total) by mouth 2 (two) times daily as needed for refractory nausea /  vomiting. Start on day 3 after chemotherapy. (Patient not taking: Reported on 11/23/2018) 30 tablet 1  . polyethylene glycol (MIRALAX / GLYCOLAX) packet Take 17 g by mouth daily as needed.      No current facility-administered medications for this visit.    Facility-Administered Medications Ordered in Other Visits  Medication Dose Route Frequency Provider Last Rate Last Dose  . heparin lock flush 100 unit/mL  500 Units Intravenous Once Sindy Guadeloupe, MD      . heparin lock flush 100 unit/mL  500 Units Intravenous Once Corcoran, Melissa C, MD      . heparin lock flush 100 unit/mL  500 Units Intravenous Once Corcoran, Melissa C, MD      . sodium chloride flush (NS) 0.9 % injection 10 mL  10 mL Intravenous PRN Sindy Guadeloupe, MD   10 mL at 10/05/18 0845  . sodium chloride flush (NS) 0.9 % injection 10 mL  10 mL Intravenous PRN Nolon Stalls C, MD   10 mL at 11/09/18 0840  . sodium chloride flush (NS) 0.9 % injection 10 mL  10 mL Intravenous PRN Lequita Asal, MD   10 mL at 11/23/18 2505    Review of Systems:  GENERAL:  Feels "so so".  No fevers, sweats.  Weight up 2  pounds. PERFORMANCE STATUS (ECOG):  1-2. HEENT:  No visual changes, runny nose, sore throat, mouth sores or tenderness. Lungs: No shortness of breath or cough.  No hemoptysis. Cardiac:  No chest pain, palpitations, orthopnea, or PND. GI:  Eating well.  Constipation.  No nausea, vomiting, diarrhea, melena or hematochezia. GU:  No urgency, frequency, dysuria, or hematuria. Musculoskeletal:  No back pain.  No joint pain.  No muscle tenderness. Extremities:  No pain or swelling. Skin:  No rashes or skin changes. Neuro:  Denies neuropathy.  No headache, numbness or weakness, balance or coordination issues. Endocrine:  No diabetes, thyroid issues, hot flashes or night sweats. Psych:  No mood changes, depression or anxiety. Pain:  No focal pain. Review of systems:  All other systems reviewed and found to be negative.   Physical Exam: Blood pressure (!) 109/51, pulse 88, temperature (!) 97.1 F (36.2 C), temperature source Tympanic, resp. rate 18, height 5' 4"  (1.626 m), weight 153 lb 10.6 oz (69.7 kg), SpO2 97 %. GENERAL:  Well developed, well nourished, woman sitting comfortably in the exam room in no acute distress. MENTAL STATUS:  Alert and oriented to person, place and time. HEAD:  Graying short brown hair.  Normocephalic, atraumatic, face symmetric, no Cushingoid features. EYES:  Blue eyes.  Pupils equal round and reactive to light and accomodation.  No conjunctivitis or scleral icterus. ENT:  Oropharynx clear without lesion.  Tongue normal. Mucous membranes moist.  RESPIRATORY:  Clear to auscultation without rales, wheezes or rhonchi. CARDIOVASCULAR:  Regular rate and rhythm without murmur, rub or gallop. ABDOMEN:  Soft, non-tender, with active bowel sounds, and no hepatosplenomegaly.  No masses. SKIN:  No rashes, ulcers or lesions. EXTREMITIES: No edema, no skin discoloration or tenderness.  No palpable cords. LYMPH NODES: No palpable cervical, supraclavicular, axillary or inguinal  adenopathy  NEUROLOGICAL: Unremarkable. PSYCH:  Appropriate.    Infusion on 11/23/2018  Component Date Value Ref Range Status  . Sodium 11/23/2018 139  135 - 145 mmol/L Final  . Potassium 11/23/2018 3.6  3.5 - 5.1 mmol/L Final  . Chloride 11/23/2018 106  98 - 111 mmol/L Final  . CO2 11/23/2018 24  22 - 32  mmol/L Final  . Glucose, Bld 11/23/2018 105* 70 - 99 mg/dL Final  . BUN 11/23/2018 17  8 - 23 mg/dL Final  . Creatinine, Ser 11/23/2018 0.65  0.44 - 1.00 mg/dL Final  . Calcium 11/23/2018 8.9  8.9 - 10.3 mg/dL Final  . Total Protein 11/23/2018 6.6  6.5 - 8.1 g/dL Final  . Albumin 11/23/2018 3.4* 3.5 - 5.0 g/dL Final  . AST 11/23/2018 22  15 - 41 U/L Final  . ALT 11/23/2018 25  0 - 44 U/L Final  . Alkaline Phosphatase 11/23/2018 124  38 - 126 U/L Final  . Total Bilirubin 11/23/2018 0.7  0.3 - 1.2 mg/dL Final  . GFR calc non Af Amer 11/23/2018 >60  >60 mL/min Final  . GFR calc Af Amer 11/23/2018 >60  >60 mL/min Final  . Anion gap 11/23/2018 9  5 - 15 Final   Performed at [facility], [address] [place], [place] [address]  . WBC 11/23/2018 8.1  4.0 - 10.5 K/uL Final  . RBC 11/23/2018 3.71* 3.87 - 5.11 MIL/uL Final  . Hemoglobin 11/23/2018 12.6  12.0 - 15.0 g/dL Final  . HCT 11/23/2018 38.0  36.0 - 46.0 % Final  . MCV 11/23/2018 102.4* 80.0 - 100.0 fL Final  . MCH 11/23/2018 34.0  26.0 - 34.0 pg Final  . MCHC 11/23/2018 33.2  30.0 - 36.0 g/dL Final  . RDW 11/23/2018 15.9* 11.5 - 15.5 % Final  . Platelets 11/23/2018 139* 150 - 400 K/uL Final  . nRBC 11/23/2018 0.0  0.0 - 0.2 % Final  . Neutrophils Relative % 11/23/2018 72  % Final  . Neutro Abs 11/23/2018 5.8  1.7 - 7.7 K/uL Final  . Lymphocytes Relative 11/23/2018 14  % Final  . Lymphs Abs 11/23/2018 1.1  0.7 - 4.0 K/uL Final  . Monocytes Relative 11/23/2018 12  % Final  . Monocytes Absolute 11/23/2018 1.0  0.1 - 1.0 K/uL Final  . Eosinophils Relative 11/23/2018 1  % Final  . Eosinophils Absolute 11/23/2018  0.1  0.0 - 0.5 K/uL Final  . Basophils Relative 11/23/2018 0  % Final  . Basophils Absolute 11/23/2018 0.0  0.0 - 0.1 K/uL Final  . Immature Granulocytes 11/23/2018 1  % Final  . Abs Immature Granulocytes 11/23/2018 0.04  0.00 - 0.07 K/uL Final   Performed at [facility], [address], [place], [place] [address]  . Magnesium 11/23/2018 2.3  1.7 - 2.4 mg/dL Final   Performed at [facility], [address], [place], [place] [address]    Assessment:  Kathy [name] is a 73 y.o. female with stage IIIB ascending colon cancer s/p hemicolectomy on 07/06/2018.  Pathology revealed a moderately differentiated adenocarcinoma with invasion through the visceral peritoneum.  There was metastatic carcinoma in 1 of 44 regional lymph nodes.  Margins were negative.  There was no lymphovascular invasion or perineural invasion was identified.  There were no tumor deposits identified. Pathologic stage was pT4a pN1a.  MSI was stable.  CEA was 3.0 on 08/03/2018  Abdomen and pelvis CT on 06/17/2018 revealed a 3.2 cm constricting apple core type ascending colon cancer near the hepatic flexure.  There were small pericolonic lymph nodes but no overt adenopathy.  There was a 3.5 mm left hepatic lobe lesion characterized as hemangioma on MRI.  Chest CT on 06/17/2018 revealed small subcentimeter pulmonary nodules, nonspecific.  She is s/p 6 cycles of FOLFOX chemotherapy (08/03/2018 - 10/19/2018).  Because  of her age and frail status, oxaliplatin was reduced to 65 mg/m2.   She had significant chemotherapy induced diarrhea, thus her 5-FU bolus was deleted after cycle #4.    She has mild chemotherapy induced anemia.  She has a history of iron deficiency requiring IV iron in the past.  B12 was 617 and folate 18.1 on 08/17/2018.  Ferritin was 260 with an iron saturation of 34% and a TIBC of 309 on 10/19/2018.  She has schizophrenia.  Symptomatically, she is doing well.  She has had some constipation.   She denies any neuropathy.  Weight is up 2 pounds.  Exam is unremarkable.  Plan: 1.  Labs today: CBC with diff, CMP, Mg. 2.  Stage IIIB ascending colon cancer  Clinically doing well.  Patient declines any neuropathy symptoms.  Cycle #8 mFOLFOX chemotherapy today.  She does not receive bolus 5FU.  Oxaliplatin remains at 65 mg/m2.  RTC in 2 days for pump disconnect. 3.  Oral ulcers  No current issues.  Continue Magic Mouthwash BID for prophylaxis. 4.  Constipation  Discuss management with stool softeners and Miralax. 5.  RTC in 2 weeks for MD assessment, labs (CBC with diff, CMP, Mg), and cycle #9 mFOLFOX chemotherapy.   Honor Loh, NP  11/23/2018, 9:40 AM   I saw and evaluated the patient, participating in the key portions of the service and reviewing pertinent diagnostic studies and records.  I reviewed the nurse practitioner's note and agree with the findings and the plan.  The assessment and plan were discussed with the patient.  Mltiple questions were asked by the patient and answered.   Nolon Stalls, MD 11/23/2018,9:40 AM

## 2018-11-23 ENCOUNTER — Encounter: Payer: Self-pay | Admitting: Hematology and Oncology

## 2018-11-23 ENCOUNTER — Inpatient Hospital Stay: Payer: Medicare Other

## 2018-11-23 ENCOUNTER — Inpatient Hospital Stay: Payer: Medicare Other | Attending: Hematology and Oncology | Admitting: Hematology and Oncology

## 2018-11-23 VITALS — BP 109/51 | HR 88 | Temp 97.1°F | Resp 18 | Ht 64.0 in | Wt 153.7 lb

## 2018-11-23 DIAGNOSIS — C182 Malignant neoplasm of ascending colon: Secondary | ICD-10-CM

## 2018-11-23 DIAGNOSIS — J449 Chronic obstructive pulmonary disease, unspecified: Secondary | ICD-10-CM | POA: Diagnosis not present

## 2018-11-23 DIAGNOSIS — C183 Malignant neoplasm of hepatic flexure: Secondary | ICD-10-CM

## 2018-11-23 DIAGNOSIS — D6481 Anemia due to antineoplastic chemotherapy: Secondary | ICD-10-CM | POA: Diagnosis not present

## 2018-11-23 DIAGNOSIS — E538 Deficiency of other specified B group vitamins: Secondary | ICD-10-CM | POA: Diagnosis not present

## 2018-11-23 DIAGNOSIS — Z5111 Encounter for antineoplastic chemotherapy: Secondary | ICD-10-CM | POA: Diagnosis not present

## 2018-11-23 DIAGNOSIS — Z87891 Personal history of nicotine dependence: Secondary | ICD-10-CM | POA: Diagnosis not present

## 2018-11-23 DIAGNOSIS — K5900 Constipation, unspecified: Secondary | ICD-10-CM

## 2018-11-23 DIAGNOSIS — M810 Age-related osteoporosis without current pathological fracture: Secondary | ICD-10-CM

## 2018-11-23 DIAGNOSIS — T451X5A Adverse effect of antineoplastic and immunosuppressive drugs, initial encounter: Secondary | ICD-10-CM

## 2018-11-23 DIAGNOSIS — M129 Arthropathy, unspecified: Secondary | ICD-10-CM | POA: Insufficient documentation

## 2018-11-23 DIAGNOSIS — F209 Schizophrenia, unspecified: Secondary | ICD-10-CM | POA: Insufficient documentation

## 2018-11-23 DIAGNOSIS — Z7982 Long term (current) use of aspirin: Secondary | ICD-10-CM | POA: Diagnosis not present

## 2018-11-23 DIAGNOSIS — Z79899 Other long term (current) drug therapy: Secondary | ICD-10-CM | POA: Diagnosis not present

## 2018-11-23 DIAGNOSIS — Z809 Family history of malignant neoplasm, unspecified: Secondary | ICD-10-CM | POA: Diagnosis not present

## 2018-11-23 DIAGNOSIS — E559 Vitamin D deficiency, unspecified: Secondary | ICD-10-CM | POA: Insufficient documentation

## 2018-11-23 DIAGNOSIS — M549 Dorsalgia, unspecified: Secondary | ICD-10-CM | POA: Diagnosis not present

## 2018-11-23 LAB — MAGNESIUM: Magnesium: 2.3 mg/dL (ref 1.7–2.4)

## 2018-11-23 LAB — CBC WITH DIFFERENTIAL/PLATELET
Abs Immature Granulocytes: 0.04 10*3/uL (ref 0.00–0.07)
Basophils Absolute: 0 10*3/uL (ref 0.0–0.1)
Basophils Relative: 0 %
Eosinophils Absolute: 0.1 10*3/uL (ref 0.0–0.5)
Eosinophils Relative: 1 %
HCT: 38 % (ref 36.0–46.0)
Hemoglobin: 12.6 g/dL (ref 12.0–15.0)
Immature Granulocytes: 1 %
Lymphocytes Relative: 14 %
Lymphs Abs: 1.1 10*3/uL (ref 0.7–4.0)
MCH: 34 pg (ref 26.0–34.0)
MCHC: 33.2 g/dL (ref 30.0–36.0)
MCV: 102.4 fL — ABNORMAL HIGH (ref 80.0–100.0)
Monocytes Absolute: 1 10*3/uL (ref 0.1–1.0)
Monocytes Relative: 12 %
Neutro Abs: 5.8 10*3/uL (ref 1.7–7.7)
Neutrophils Relative %: 72 %
Platelets: 139 10*3/uL — ABNORMAL LOW (ref 150–400)
RBC: 3.71 MIL/uL — ABNORMAL LOW (ref 3.87–5.11)
RDW: 15.9 % — ABNORMAL HIGH (ref 11.5–15.5)
WBC: 8.1 10*3/uL (ref 4.0–10.5)
nRBC: 0 % (ref 0.0–0.2)

## 2018-11-23 LAB — COMPREHENSIVE METABOLIC PANEL
ALT: 25 U/L (ref 0–44)
AST: 22 U/L (ref 15–41)
Albumin: 3.4 g/dL — ABNORMAL LOW (ref 3.5–5.0)
Alkaline Phosphatase: 124 U/L (ref 38–126)
Anion gap: 9 (ref 5–15)
BUN: 17 mg/dL (ref 8–23)
CO2: 24 mmol/L (ref 22–32)
Calcium: 8.9 mg/dL (ref 8.9–10.3)
Chloride: 106 mmol/L (ref 98–111)
Creatinine, Ser: 0.65 mg/dL (ref 0.44–1.00)
GFR calc Af Amer: >60 mL/min (ref >60)
GFR calc non Af Amer: >60 mL/min (ref >60)
Glucose, Bld: 105 mg/dL — ABNORMAL HIGH (ref 70–99)
Potassium: 3.6 mmol/L (ref 3.5–5.1)
Sodium: 139 mmol/L (ref 135–145)
Total Bilirubin: 0.7 mg/dL (ref 0.3–1.2)
Total Protein: 6.6 g/dL (ref 6.5–8.1)

## 2018-11-23 MED ORDER — OXALIPLATIN CHEMO INJECTION 100 MG/20ML
65.0000 mg/m2 | Freq: Once | INTRAVENOUS | Status: AC
  Administered 2018-11-23: 115 mg via INTRAVENOUS
  Filled 2018-11-23: qty 20

## 2018-11-23 MED ORDER — SODIUM CHLORIDE 0.9 % IV SOLN
2400.0000 mg/m2 | INTRAVENOUS | Status: AC
  Administered 2018-11-23: 4300 mg via INTRAVENOUS
  Filled 2018-11-23: qty 86

## 2018-11-23 MED ORDER — DEXAMETHASONE SODIUM PHOSPHATE 10 MG/ML IJ SOLN
10.0000 mg | Freq: Once | INTRAMUSCULAR | Status: AC
  Administered 2018-11-23: 10 mg via INTRAVENOUS
  Filled 2018-11-23: qty 3
  Filled 2018-11-23: qty 1

## 2018-11-23 MED ORDER — PALONOSETRON HCL INJECTION 0.25 MG/5ML
0.2500 mg | Freq: Once | INTRAVENOUS | Status: AC
  Administered 2018-11-23: 0.25 mg via INTRAVENOUS
  Filled 2018-11-23: qty 5

## 2018-11-23 MED ORDER — LEUCOVORIN CALCIUM INJECTION 350 MG
700.0000 mg | Freq: Once | INTRAVENOUS | Status: AC
  Administered 2018-11-23: 700 mg via INTRAVENOUS
  Filled 2018-11-23: qty 35

## 2018-11-23 MED ORDER — MAGIC MOUTHWASH W/LIDOCAINE: 10.0000 mL | Freq: Four times a day (QID) | ORAL | Status: DC | PRN

## 2018-11-23 MED ORDER — HEPARIN SOD (PORK) LOCK FLUSH 100 UNIT/ML IV SOLN
500.0000 [IU] | Freq: Once | INTRAVENOUS | Status: AC
  Filled 2018-11-23: qty 5

## 2018-11-23 MED ORDER — SODIUM CHLORIDE 0.9% FLUSH
10.0000 mL | INTRAVENOUS | Status: AC | PRN
  Administered 2018-11-23: 10 mL via INTRAVENOUS
  Filled 2018-11-23: qty 10

## 2018-11-23 MED ORDER — CYCLOBENZAPRINE HCL 5 MG PO TABS: 5.0000 mg | ORAL_TABLET | Freq: Three times a day (TID) | ORAL | 0 refills | Status: DC | PRN

## 2018-11-23 MED ORDER — DEXTROSE 5 % IV SOLN
Freq: Once | INTRAVENOUS | Status: AC
  Administered 2018-11-23: 10:00:00 via INTRAVENOUS
  Filled 2018-11-23: qty 250

## 2018-11-23 NOTE — Progress Notes (Signed)
The patient is currently taking Senna and still c/o constipation / The patient states she has not had a B/M since 11/19/2018. She reports she has been taking Miralax and Senna together on 11/21/2018 with no releif  . She is also c/o increase back pain (lower) pain level 7 Started 11/20/2018.

## 2018-11-25 ENCOUNTER — Other Ambulatory Visit: Payer: Self-pay | Admitting: Hematology and Oncology

## 2018-11-25 ENCOUNTER — Other Ambulatory Visit: Payer: Self-pay | Admitting: Urgent Care

## 2018-11-25 ENCOUNTER — Inpatient Hospital Stay: Payer: Medicare Other

## 2018-11-25 ENCOUNTER — Telehealth: Payer: Self-pay

## 2018-11-25 VITALS — BP 123/74 | HR 106 | Temp 99.0°F | Resp 18

## 2018-11-25 DIAGNOSIS — C182 Malignant neoplasm of ascending colon: Secondary | ICD-10-CM | POA: Diagnosis not present

## 2018-11-25 DIAGNOSIS — C183 Malignant neoplasm of hepatic flexure: Secondary | ICD-10-CM

## 2018-11-25 MED ORDER — SODIUM CHLORIDE 0.9% FLUSH
10.0000 mL | INTRAVENOUS | Status: DC | PRN
  Administered 2018-11-25: 10 mL
  Filled 2018-11-25: qty 10

## 2018-11-25 MED ORDER — MAGIC MOUTHWASH W/LIDOCAINE: 10.0000 mL | Freq: Three times a day (TID) | ORAL | 0 refills | Status: DC | PRN

## 2018-11-25 MED ORDER — HEPARIN SOD (PORK) LOCK FLUSH 100 UNIT/ML IV SOLN
500.0000 [IU] | Freq: Once | INTRAVENOUS | Status: AC | PRN
  Administered 2018-11-25: 500 [IU]

## 2018-11-25 MED ORDER — MAGIC MOUTHWASH W/LIDOCAINE: 10.0000 mL | Freq: Four times a day (QID) | ORAL | Status: DC | PRN

## 2018-11-25 MED ORDER — HEPARIN SOD (PORK) LOCK FLUSH 100 UNIT/ML IV SOLN
INTRAVENOUS | Status: AC
  Filled 2018-11-25: qty 5

## 2018-11-25 NOTE — Telephone Encounter (Signed)
Spoke with [name] to advise of Dr. Kem Parkinson suggestions regarding back pain. Informed him patient needs to be seen by PCP to manage back pain. Patient is taking Flexeril and using heat pad with no relief.   Son also request refill on Magic Mouthwash.

## 2018-11-25 NOTE — Telephone Encounter (Signed)
-----   Message from [place] sent at 11/25/2018  9:07 AM EST ----- Regarding: Pain Advice Contact: [phone] Kathy [name]'s son [name] called requesting advice on how to alleviate her back pain.  She has excruciatingly constant back pain that she only gets relief from when she cries herself to sleep.  He would like a call back before she comes in this afternoon at 130 to have her pump removed.

## 2018-11-26 ENCOUNTER — Telehealth: Payer: Self-pay | Admitting: *Deleted

## 2018-11-26 NOTE — Telephone Encounter (Signed)
It is whatever they filled for her LAST TIME. This is a refill. Standard magic mouthwash.

## 2018-11-26 NOTE — Telephone Encounter (Signed)
Pharmacy called with questions on prescription for mouthwash, needs ratios of lidocaine to everything else

## 2018-12-01 ENCOUNTER — Other Ambulatory Visit: Payer: Medicare Other

## 2018-12-01 ENCOUNTER — Ambulatory Visit: Payer: Medicare Other

## 2018-12-01 ENCOUNTER — Ambulatory Visit: Payer: Medicare Other | Admitting: Hematology and Oncology

## 2018-12-06 NOTE — Progress Notes (Deleted)
[facility] day:  12/06/2018  Chief Complaint: Kathy [name] is a 73 y.o. female with stage IIIB colon cancer who is seen for assessment prior to cycle #9 FOLFOX chemotherapy.   HPI:  The patient was last seen in the medical oncology clinic on 11/23/2018.  At that time, she was doing well.  She denied any neuropathy.  Weight was up 2 pounds.  She received FOLFOX chemotherapy.   During the interim,    Past Medical History:  Diagnosis Date  . Arthritis    hips  . B12 deficiency   . Colon cancer ([place]) 07/06/2018  . 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 COPD (chronic obstructive pulmonary disease) ([place])   . Fibrocystic breast disease   . Mediastinal mass    removed 1/16  . Motion sickness    fair rides  . Osteoporosis   . Schizophrenia ([place])   . Vitamin D deficiency   . Wears dentures    partial upper and lower    Past Surgical History:  Procedure Laterality Date  . BREAST BIOPSY Left    neg  . BREAST BIOPSY Right 05/04/13   [place] bx/clip-neg  . COLONOSCOPY    . COLONOSCOPY WITH PROPOFOL N/A 06/10/2018   Procedure: COLONOSCOPY WITH PROPOFOL;  Surgeon: Toledo, [name], MD;  Location: [facility];  Service: Gastroenterology;  Laterality: N/A;  . ESOPHAGOGASTRODUODENOSCOPY N/A 06/10/2018   Procedure: ESOPHAGOGASTRODUODENOSCOPY (EGD);  Surgeon: Toledo, [name], MD;  Location: [facility];  Service: Gastroenterology;  Laterality: N/A;  . HALLUX VALGUS AKIN Right 11/20/2016   Procedure: HALLUX VALGUS AKIN;  Surgeon: Samara Deist, DPM;  Location: [place];  Service: Podiatry;  Laterality: Right;  . HALLUX VALGUS AUSTIN Right 11/20/2016   Procedure: HALLUX VALGUS AUSTIN  Alroy Dust) right;  Surgeon: Samara Deist, DPM;  Location: [place];  Service: Podiatry;  Laterality: Right;  IVA with Popliteal  . HALLUX VALGUS AUSTIN Left 01/15/2017   Procedure: HALLUX VALGUS AUSTIN  Correction left foot  Iva Popiteal;  Surgeon:  Samara Deist, DPM;  Location: [place];  Service: Podiatry;  Laterality: Left;  IVA Popliteal   . LAPAROSCOPIC RIGHT COLECTOMY N/A 07/06/2018   Procedure: LAPAROSCOPIC COLECTOMY;  Surgeon: Benjamine Sprague, DO;  Location: [facility];  Service: General;  Laterality: N/A;  . MEDIASTINAL MASS EXCISION Left 11/09/2014   Dr. Wynelle Cleveland, [place]  . PORTACATH PLACEMENT Right 07/30/2018   Procedure: INSERTION PORT-A-CATH;  Surgeon: Benjamine Sprague, DO;  Location: [facility];  Service: General;  Laterality: Right;  . THORACOSCOPY Left 11/09/2014   with excision mediastinal mass  . TUBAL LIGATION      Family History  Problem Relation Age of Onset  . Cancer Father   . Breast cancer Neg Hx     Social History:  reports that she quit smoking about 4 years ago. She has never used smokeless tobacco. She reports that she does not drink alcohol or use drugs.  The patient is accompanied by by her son, [name], today.  Allergies:  Allergies  Allergen Reactions  . Biaxin [Clarithromycin] Shortness Of Breath  . Levofloxacin     Muscle pain  . Penicillins Rash    Has patient had a PCN reaction causing immediate rash, facial/tongue/throat swelling, SOB or lightheadedness with hypotension: no Has patient had a PCN reaction causing severe rash involving mucus membranes or skin necrosis: no Has patient had a PCN reaction that required hospitalization: no Has patient had a PCN reaction  occurring within the last 10 years: no If all of the above answers are "NO", then may proceed with Cephalosporin use.     Current Medications: Current Outpatient Medications  Medication Sig Dispense Refill  . ASPIRIN 81 PO Take 81 mg by mouth daily.     . benzonatate (TESSALON) 100 MG capsule Take 1 capsule (100 mg total) by mouth 3 (three) times daily as needed for cough. (Patient not taking: Reported on 11/23/2018) 60 capsule 0  . Cholecalciferol (VITAMIN D3) 2000 units TABS Take 2,000 Units by mouth daily.     Marland Kitchen conjugated  estrogens (PREMARIN) vaginal cream Place 1 Applicatorful vaginally 2 (two) times a week.     . cyclobenzaprine (FLEXERIL) 5 MG tablet Take 1 tablet (5 mg total) by mouth 3 (three) times daily as needed for muscle spasms. 30 tablet 0  . dexamethasone (DECADRON) 4 MG tablet Take 2 tablets (8 mg total) by mouth daily. Start the day after chemotherapy for 2 days. Take with food. (Patient not taking: Reported on 11/23/2018) 30 tablet 1  . ibuprofen (ADVIL,MOTRIN) 800 MG tablet Take 1 tablet (800 mg total) by mouth every 8 (eight) hours as needed for mild pain or moderate pain. (Patient not taking: Reported on 11/23/2018) 30 tablet 0  . lidocaine-prilocaine (EMLA) cream APPLY EXTERNALLY TO THE AFFECTED AREA 1 TIME (Patient not taking: Reported on 11/23/2018) 30 g 0  . LORazepam (ATIVAN) 0.5 MG tablet Take 1 tablet (0.5 mg total) by mouth every 6 (six) hours as needed (Nausea or vomiting). (Patient not taking: Reported on 11/23/2018) 30 tablet 0  . magic mouthwash w/lidocaine SOLN Take 10 mLs by mouth 3 (three) times daily as needed. 450 mL 0  . ondansetron (ZOFRAN) 8 MG tablet Take 1 tablet (8 mg total) by mouth 2 (two) times daily as needed for refractory nausea / vomiting. Start on day 3 after chemotherapy. (Patient not taking: Reported on 11/23/2018) 30 tablet 1  . polyethylene glycol (MIRALAX / GLYCOLAX) packet Take 17 g by mouth daily as needed.     . potassium chloride SA (K-DUR,KLOR-CON) 20 MEQ tablet TAKE 2 TABLET BY MOUTH TWICE DAILY 360 tablet 0  . Potassium Gluconate 550 MG TABS Take 1 tablet by mouth daily.    Marland Kitchen PROCYCLIDINE HCL PO Take 5 mg by mouth 3 (three) times daily. [facility])     . senna (SENOKOT) 8.6 MG tablet Take 1 tablet by mouth daily as needed.     . thiothixene (NAVANE) 5 MG capsule Take 10 mg by mouth at bedtime.     . vitamin B-12 (CYANOCOBALAMIN) 1000 MCG tablet Take 1,000 mcg by mouth daily.      No current facility-administered medications for this visit.    Facility-Administered  Medications Ordered in Other Visits  Medication Dose Route Frequency Provider Last Rate Last Dose  . heparin lock flush 100 unit/mL  500 Units Intravenous Once Sindy Guadeloupe, MD      . heparin lock flush 100 unit/mL  500 Units Intravenous Once Corcoran, Melissa C, MD      . heparin lock flush 100 unit/mL  500 Units Intravenous Once Corcoran, Melissa C, MD      . sodium chloride flush (NS) 0.9 % injection 10 mL  10 mL Intravenous PRN Sindy Guadeloupe, MD   10 mL at 10/05/18 0845  . sodium chloride flush (NS) 0.9 % injection 10 mL  10 mL Intravenous PRN Nolon Stalls C, MD   10 mL at 11/09/18 0840  .  sodium chloride flush (NS) 0.9 % injection 10 mL  10 mL Intravenous PRN Lequita Asal, MD   10 mL at 11/23/18 4166    Review of Systems:  GENERAL:  Feels "ok".  No fevers, sweats.  Weight back to baseline. PERFORMANCE STATUS (ECOG):  1-2 HEENT:  Mouth less sensitive.  No visual changes, runny nose, sore throat, mouth sores or tenderness. Lungs: No shortness of breath or cough.  No hemoptysis. Cardiac:  No chest pain, palpitations, orthopnea, or PND. GI:  Eating well.  No nausea, vomiting, diarrhea, constipation, melena or hematochezia. GU:  No urgency, frequency, dysuria, or hematuria. Musculoskeletal:  No back pain.  No joint pain.  No muscle tenderness. Extremities:  No pain or swelling. Skin:  No rashes or skin changes. Neuro:  Occasionally dizzy on standing.  No headache, numbness or weakness, balance or coordination issues. Endocrine:  No diabetes, thyroid issues, hot flashes or night sweats. Psych:  No mood changes, depression or anxiety. Pain:  No focal pain. Review of systems:  All other systems reviewed and found to be negative.   Physical Exam: There were no vitals taken for this visit. GENERAL:  Well developed, well nourished, woman sitting comfortably in the exam room in no acute distress. MENTAL STATUS:  Alert and oriented to person, place and time. HEAD:  Graying short  brown hair.  Normocephalic, atraumatic, face symmetric, no Cushingoid features. EYES:  Blue eyes.  Pupils equal round and reactive to light and accomodation.  No conjunctivitis or scleral icterus. ENT:  Oropharynx clear without lesion (ulcers resolved).  Tongue normal. Mucous membranes moist.  RESPIRATORY:  Clear to auscultation without rales, wheezes or rhonchi. CARDIOVASCULAR:  Regular rate and rhythm without murmur, rub or gallop. ABDOMEN:  Soft, non-tender, with active bowel sounds, and no hepatosplenomegaly.  No masses. SKIN:  No rashes, ulcers or lesions. EXTREMITIES: No edema, no skin discoloration or tenderness.  No palpable cords. LYMPH NODES: No palpable cervical, supraclavicular, axillary or inguinal adenopathy  NEUROLOGICAL: Unremarkable. PSYCH:  Appropriate.    No visits with results within 3 Day(s) from this visit.  Latest known visit with results is:  Infusion on 11/23/2018  Component Date Value Ref Range Status  . Sodium 11/23/2018 139  135 - 145 mmol/L Final  . Potassium 11/23/2018 3.6  3.5 - 5.1 mmol/L Final  . Chloride 11/23/2018 106  98 - 111 mmol/L Final  . CO2 11/23/2018 24  22 - 32 mmol/L Final  . Glucose, Bld 11/23/2018 105* 70 - 99 mg/dL Final  . BUN 11/23/2018 17  8 - 23 mg/dL Final  . Creatinine, Ser 11/23/2018 0.65  0.44 - 1.00 mg/dL Final  . Calcium 11/23/2018 8.9  8.9 - 10.3 mg/dL Final  . Total Protein 11/23/2018 6.6  6.5 - 8.1 g/dL Final  . Albumin 11/23/2018 3.4* 3.5 - 5.0 g/dL Final  . AST 11/23/2018 22  15 - 41 U/L Final  . ALT 11/23/2018 25  0 - 44 U/L Final  . Alkaline Phosphatase 11/23/2018 124  38 - 126 U/L Final  . Total Bilirubin 11/23/2018 0.7  0.3 - 1.2 mg/dL Final  . GFR calc non Af Amer 11/23/2018 >60  >60 mL/min Final  . GFR calc Af Amer 11/23/2018 >60  >60 mL/min Final  . Anion gap 11/23/2018 9  5 - 15 Final   Performed at [facility], [address], [place], [place] [address]  . WBC 11/23/2018 8.1  4.0 - 10.5 K/uL Final   . RBC  11/23/2018 3.71* 3.87 - 5.11 MIL/uL Final  . Hemoglobin 11/23/2018 12.6  12.0 - 15.0 g/dL Final  . HCT 11/23/2018 38.0  36.0 - 46.0 % Final  . MCV 11/23/2018 102.4* 80.0 - 100.0 fL Final  . MCH 11/23/2018 34.0  26.0 - 34.0 pg Final  . MCHC 11/23/2018 33.2  30.0 - 36.0 g/dL Final  . RDW 11/23/2018 15.9* 11.5 - 15.5 % Final  . Platelets 11/23/2018 139* 150 - 400 K/uL Final  . nRBC 11/23/2018 0.0  0.0 - 0.2 % Final  . Neutrophils Relative % 11/23/2018 72  % Final  . Neutro Abs 11/23/2018 5.8  1.7 - 7.7 K/uL Final  . Lymphocytes Relative 11/23/2018 14  % Final  . Lymphs Abs 11/23/2018 1.1  0.7 - 4.0 K/uL Final  . Monocytes Relative 11/23/2018 12  % Final  . Monocytes Absolute 11/23/2018 1.0  0.1 - 1.0 K/uL Final  . Eosinophils Relative 11/23/2018 1  % Final  . Eosinophils Absolute 11/23/2018 0.1  0.0 - 0.5 K/uL Final  . Basophils Relative 11/23/2018 0  % Final  . Basophils Absolute 11/23/2018 0.0  0.0 - 0.1 K/uL Final  . Immature Granulocytes 11/23/2018 1  % Final  . Abs Immature Granulocytes 11/23/2018 0.04  0.00 - 0.07 K/uL Final   Performed at [facility], [address] [place], [place] [address]  . Magnesium 11/23/2018 2.3  1.7 - 2.4 mg/dL Final   Performed at [facility], [address] [place], [place] [address]    Assessment:  LINDSEY DEMONTE is a 73 y.o. female with stage IIIB ascending colon cancer s/p hemicolectomy on 07/06/2018.  Pathology revealed a moderately differentiated adenocarcinoma with invasion through the visceral peritoneum.  There was metastatic carcinoma in 1 of 44 regional lymph nodes.  Margins were negative.  There was no lymphovascular invasion or perineural invasion was identified.  There were no tumor deposits identified. Pathologic stage was pT4a pN1a.  MSI was stable.  CEA was 3.0 on 08/03/2018  Abdomen and pelvis CT on 06/17/2018 revealed a 3.2 cm constricting apple core type ascending colon cancer near the hepatic flexure.   There were small pericolonic lymph nodes but no overt adenopathy.  There was a 3.5 mm left hepatic lobe lesion characterized as hemangioma on MRI.  Chest CT on 06/17/2018 revealed small subcentimeter pulmonary nodules, nonspecific.  She is s/p 6 cycles of FOLFOX chemotherapy (08/03/2018 - 10/19/2018).  Because of her age and frail status, oxaliplatin was reduced to 65 mg/m2.   She had significant chemotherapy induced diarrhea, thus her 5-FU bolus was deleted after cycle #4.    She has mild chemotherapy induced anemia.  She has a history of iron deficiency requiring IV iron in the past.  B12 was 617 and folate 18.1 on 08/17/2018.  Ferritin was 260 with an iron saturation of 34% and a TIBC of 309 on 10/19/2018.  She has schizophrenia.  Symptomatically,    Plan: 1.  Labs today: CBC with diff, CMP, Mg.   2.  Stage IIIB ascending colon cancer:  Discuss plan to reinitiate chemotherapy..  Patient is s/p 6 cycles of modified FOLFOX chemotherapy.  She does not receive bolus 5FU.  Oxaliplatin is reduced to 65 mg/m2.  Cycle #7 mFOLFOX chemotherapy today.  RTC in 2 days for disconnect.  Check nadir counts in 1 week. 3.  Dehydration and hypokalemia:  Resolved. 4.  Oral ulcers:  Resolved.    Patient notes residual oral tenderness.  Continue Magic  Mouthwash BID for prophylaxis. 5.  RTC in 1 week for labs (CBC with diff). 6.  RTC in 2 weeks for MD assessment, labs (CBC with diff, CMP, Mg), and cycle #8 FOLFOX chemotherapy.   Lequita Asal, MD  12/06/2018, 1:24 PM   I saw and evaluated the patient, participating in the key portions of the service and reviewing pertinent diagnostic studies and records.  I reviewed the nurse practitioner's note and agree with the findings and the plan.  The assessment and plan were discussed with the patient.  Additional diagnostic studies of *** are needed to clarify *** and would change the clinical management.  A few ***multiple questions were asked by the  patient and answered.   Nolon Stalls, MD 12/06/2018,1:24 PM

## 2018-12-07 ENCOUNTER — Ambulatory Visit: Payer: Medicare Other

## 2018-12-07 ENCOUNTER — Ambulatory Visit: Payer: Medicare Other | Admitting: Hematology and Oncology

## 2018-12-07 ENCOUNTER — Other Ambulatory Visit (HOSPITAL_COMMUNITY): Payer: Self-pay | Admitting: Physical Medicine and Rehabilitation

## 2018-12-07 ENCOUNTER — Other Ambulatory Visit: Payer: Medicare Other

## 2018-12-07 ENCOUNTER — Other Ambulatory Visit: Payer: Self-pay | Admitting: Physical Medicine and Rehabilitation

## 2018-12-07 DIAGNOSIS — M5416 Radiculopathy, lumbar region: Secondary | ICD-10-CM

## 2018-12-09 ENCOUNTER — Inpatient Hospital Stay: Payer: Medicare Other

## 2018-12-09 ENCOUNTER — Inpatient Hospital Stay: Payer: Medicare Other | Admitting: Hematology and Oncology

## 2018-12-09 NOTE — Progress Notes (Deleted)
[facility] day:  12/09/2018  Chief Complaint: Kathy [name] is a 73 y.o. female with stage IIIB colon cancer who is seen for assessment prior to cycle #9 FOLFOX chemotherapy.   HPI:  The patient was last seen in the medical oncology clinic on 11/23/2018.  At that time, she was doing well.  She denied any neuropathy.  Weight was up 2 pounds.  She received cycle #8 FOLFOX chemotherapy.   During the interim,    Past Medical History:  Diagnosis Date  . Arthritis    hips  . B12 deficiency   . Colon cancer ([place]) 07/06/2018  . 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 COPD (chronic obstructive pulmonary disease) ([place])   . Fibrocystic breast disease   . Mediastinal mass    removed 1/16  . Motion sickness    fair rides  . Osteoporosis   . Schizophrenia ([place])   . Vitamin D deficiency   . Wears dentures    partial upper and lower    Past Surgical History:  Procedure Laterality Date  . BREAST BIOPSY Left    neg  . BREAST BIOPSY Right 05/04/13   [place] bx/clip-neg  . COLONOSCOPY    . COLONOSCOPY WITH PROPOFOL N/A 06/10/2018   Procedure: COLONOSCOPY WITH PROPOFOL;  Surgeon: Toledo, [name], MD;  Location: [facility];  Service: Gastroenterology;  Laterality: N/A;  . ESOPHAGOGASTRODUODENOSCOPY N/A 06/10/2018   Procedure: ESOPHAGOGASTRODUODENOSCOPY (EGD);  Surgeon: Toledo, [name], MD;  Location: [facility];  Service: Gastroenterology;  Laterality: N/A;  . HALLUX VALGUS AKIN Right 11/20/2016   Procedure: HALLUX VALGUS AKIN;  Surgeon: Samara Deist, DPM;  Location: [place];  Service: Podiatry;  Laterality: Right;  . HALLUX VALGUS AUSTIN Right 11/20/2016   Procedure: HALLUX VALGUS AUSTIN  Alroy Dust) right;  Surgeon: Samara Deist, DPM;  Location: [place];  Service: Podiatry;  Laterality: Right;  IVA with Popliteal  . HALLUX VALGUS AUSTIN Left 01/15/2017   Procedure: HALLUX VALGUS AUSTIN  Correction left foot  Iva Popiteal;   Surgeon: Samara Deist, DPM;  Location: [place];  Service: Podiatry;  Laterality: Left;  IVA Popliteal   . LAPAROSCOPIC RIGHT COLECTOMY N/A 07/06/2018   Procedure: LAPAROSCOPIC COLECTOMY;  Surgeon: Benjamine Sprague, DO;  Location: [facility];  Service: General;  Laterality: N/A;  . MEDIASTINAL MASS EXCISION Left 11/09/2014   Dr. Wynelle [place], [place]  . PORTACATH PLACEMENT Right 07/30/2018   Procedure: INSERTION PORT-A-CATH;  Surgeon: Benjamine Sprague, DO;  Location: [facility];  Service: General;  Laterality: Right;  . THORACOSCOPY Left 11/09/2014   with excision mediastinal mass  . TUBAL LIGATION      Family History  Problem Relation Age of Onset  . Cancer Father   . Breast cancer Neg Hx     Social History:  reports that she quit smoking about 4 years ago. She has never used smokeless tobacco. She reports that she does not drink alcohol or use drugs.  The patient is accompanied by by her son, [name], today.  Allergies:  Allergies  Allergen Reactions  . Biaxin [Clarithromycin] Shortness Of Breath  . Levofloxacin     Muscle pain  . Penicillins Rash    Has patient had a PCN reaction causing immediate rash, facial/tongue/throat swelling, SOB or lightheadedness with hypotension: no Has patient had a PCN reaction causing severe rash involving mucus membranes or skin necrosis: no Has patient had a PCN reaction that required hospitalization: no Has patient had a  PCN reaction occurring within the last 10 years: no If all of the above answers are "NO", then may proceed with Cephalosporin use.     Current Medications: Current Outpatient Medications  Medication Sig Dispense Refill  . ASPIRIN 81 PO Take 81 mg by mouth daily.     . benzonatate (TESSALON) 100 MG capsule Take 1 capsule (100 mg total) by mouth 3 (three) times daily as needed for cough. (Patient not taking: Reported on 11/23/2018) 60 capsule 0  . Cholecalciferol (VITAMIN D3) 2000 units TABS Take 2,000 Units by mouth daily.     Marland Kitchen  conjugated estrogens (PREMARIN) vaginal cream Place 1 Applicatorful vaginally 2 (two) times a week.     . cyclobenzaprine (FLEXERIL) 5 MG tablet Take 1 tablet (5 mg total) by mouth 3 (three) times daily as needed for muscle spasms. 30 tablet 0  . dexamethasone (DECADRON) 4 MG tablet Take 2 tablets (8 mg total) by mouth daily. Start the day after chemotherapy for 2 days. Take with food. (Patient not taking: Reported on 11/23/2018) 30 tablet 1  . ibuprofen (ADVIL,MOTRIN) 800 MG tablet Take 1 tablet (800 mg total) by mouth every 8 (eight) hours as needed for mild pain or moderate pain. (Patient not taking: Reported on 11/23/2018) 30 tablet 0  . lidocaine-prilocaine (EMLA) cream APPLY EXTERNALLY TO THE AFFECTED AREA 1 TIME (Patient not taking: Reported on 11/23/2018) 30 g 0  . LORazepam (ATIVAN) 0.5 MG tablet Take 1 tablet (0.5 mg total) by mouth every 6 (six) hours as needed (Nausea or vomiting). (Patient not taking: Reported on 11/23/2018) 30 tablet 0  . magic mouthwash w/lidocaine SOLN Take 10 mLs by mouth 3 (three) times daily as needed. 450 mL 0  . ondansetron (ZOFRAN) 8 MG tablet Take 1 tablet (8 mg total) by mouth 2 (two) times daily as needed for refractory nausea / vomiting. Start on day 3 after chemotherapy. (Patient not taking: Reported on 11/23/2018) 30 tablet 1  . polyethylene glycol (MIRALAX / GLYCOLAX) packet Take 17 g by mouth daily as needed.     . potassium chloride SA (K-DUR,KLOR-CON) 20 MEQ tablet TAKE 2 TABLET BY MOUTH TWICE DAILY 360 tablet 0  . Potassium Gluconate 550 MG TABS Take 1 tablet by mouth daily.    Marland Kitchen PROCYCLIDINE HCL PO Take 5 mg by mouth 3 (three) times daily. [facility])     . senna (SENOKOT) 8.6 MG tablet Take 1 tablet by mouth daily as needed.     . thiothixene (NAVANE) 5 MG capsule Take 10 mg by mouth at bedtime.     . vitamin B-12 (CYANOCOBALAMIN) 1000 MCG tablet Take 1,000 mcg by mouth daily.      No current facility-administered medications for this visit.     Facility-Administered Medications Ordered in Other Visits  Medication Dose Route Frequency Provider Last Rate Last Dose  . heparin lock flush 100 unit/mL  500 Units Intravenous Once Sindy Guadeloupe, MD      . heparin lock flush 100 unit/mL  500 Units Intravenous Once Corcoran, Melissa C, MD      . heparin lock flush 100 unit/mL  500 Units Intravenous Once Corcoran, Melissa C, MD      . sodium chloride flush (NS) 0.9 % injection 10 mL  10 mL Intravenous PRN Sindy Guadeloupe, MD   10 mL at 10/05/18 0845  . sodium chloride flush (NS) 0.9 % injection 10 mL  10 mL Intravenous PRN Lequita Asal, MD   10 mL at 11/09/18  0840  . sodium chloride flush (NS) 0.9 % injection 10 mL  10 mL Intravenous PRN Lequita Asal, MD   10 mL at 11/23/18 3790    Review of Systems:  GENERAL:  Feels "ok".  No fevers, sweats.  Weight back to baseline. PERFORMANCE STATUS (ECOG):  1-2 HEENT:  Mouth less sensitive.  No visual changes, runny nose, sore throat, mouth sores or tenderness. Lungs: No shortness of breath or cough.  No hemoptysis. Cardiac:  No chest pain, palpitations, orthopnea, or PND. GI:  Eating well.  No nausea, vomiting, diarrhea, constipation, melena or hematochezia. GU:  No urgency, frequency, dysuria, or hematuria. Musculoskeletal:  No back pain.  No joint pain.  No muscle tenderness. Extremities:  No pain or swelling. Skin:  No rashes or skin changes. Neuro:  Occasionally dizzy on standing.  No headache, numbness or weakness, balance or coordination issues. Endocrine:  No diabetes, thyroid issues, hot flashes or night sweats. Psych:  No mood changes, depression or anxiety. Pain:  No focal pain. Review of systems:  All other systems reviewed and found to be negative.   Physical Exam: There were no vitals taken for this visit. GENERAL:  Well developed, well nourished, woman sitting comfortably in the exam room in no acute distress. MENTAL STATUS:  Alert and oriented to person, place and  time. HEAD:  Graying short brown hair.  Normocephalic, atraumatic, face symmetric, no Cushingoid features. EYES:  Blue eyes.  Pupils equal round and reactive to light and accomodation.  No conjunctivitis or scleral icterus. ENT:  Oropharynx clear without lesion (ulcers resolved).  Tongue normal. Mucous membranes moist.  RESPIRATORY:  Clear to auscultation without rales, wheezes or rhonchi. CARDIOVASCULAR:  Regular rate and rhythm without murmur, rub or gallop. ABDOMEN:  Soft, non-tender, with active bowel sounds, and no hepatosplenomegaly.  No masses. SKIN:  No rashes, ulcers or lesions. EXTREMITIES: No edema, no skin discoloration or tenderness.  No palpable cords. LYMPH NODES: No palpable cervical, supraclavicular, axillary or inguinal adenopathy  NEUROLOGICAL: Unremarkable. PSYCH:  Appropriate.    No visits with results within 3 Day(s) from this visit.  Latest known visit with results is:  Infusion on 11/23/2018  Component Date Value Ref Range Status  . Sodium 11/23/2018 139  135 - 145 mmol/L Final  . Potassium 11/23/2018 3.6  3.5 - 5.1 mmol/L Final  . Chloride 11/23/2018 106  98 - 111 mmol/L Final  . CO2 11/23/2018 24  22 - 32 mmol/L Final  . Glucose, Bld 11/23/2018 105* 70 - 99 mg/dL Final  . BUN 11/23/2018 17  8 - 23 mg/dL Final  . Creatinine, Ser 11/23/2018 0.65  0.44 - 1.00 mg/dL Final  . Calcium 11/23/2018 8.9  8.9 - 10.3 mg/dL Final  . Total Protein 11/23/2018 6.6  6.5 - 8.1 g/dL Final  . Albumin 11/23/2018 3.4* 3.5 - 5.0 g/dL Final  . AST 11/23/2018 22  15 - 41 U/L Final  . ALT 11/23/2018 25  0 - 44 U/L Final  . Alkaline Phosphatase 11/23/2018 124  38 - 126 U/L Final  . Total Bilirubin 11/23/2018 0.7  0.3 - 1.2 mg/dL Final  . GFR calc non Af Amer 11/23/2018 >60  >60 mL/min Final  . GFR calc Af Amer 11/23/2018 >60  >60 mL/min Final  . Anion gap 11/23/2018 9  5 - 15 Final   Performed at [facility], [address], [place], [place] [address]  . WBC  11/23/2018 8.1  4.0 - 10.5 K/uL Final  .  RBC 11/23/2018 3.71* 3.87 - 5.11 MIL/uL Final  . Hemoglobin 11/23/2018 12.6  12.0 - 15.0 g/dL Final  . HCT 11/23/2018 38.0  36.0 - 46.0 % Final  . MCV 11/23/2018 102.4* 80.0 - 100.0 fL Final  . MCH 11/23/2018 34.0  26.0 - 34.0 pg Final  . MCHC 11/23/2018 33.2  30.0 - 36.0 g/dL Final  . RDW 11/23/2018 15.9* 11.5 - 15.5 % Final  . Platelets 11/23/2018 139* 150 - 400 K/uL Final  . nRBC 11/23/2018 0.0  0.0 - 0.2 % Final  . Neutrophils Relative % 11/23/2018 72  % Final  . Neutro Abs 11/23/2018 5.8  1.7 - 7.7 K/uL Final  . Lymphocytes Relative 11/23/2018 14  % Final  . Lymphs Abs 11/23/2018 1.1  0.7 - 4.0 K/uL Final  . Monocytes Relative 11/23/2018 12  % Final  . Monocytes Absolute 11/23/2018 1.0  0.1 - 1.0 K/uL Final  . Eosinophils Relative 11/23/2018 1  % Final  . Eosinophils Absolute 11/23/2018 0.1  0.0 - 0.5 K/uL Final  . Basophils Relative 11/23/2018 0  % Final  . Basophils Absolute 11/23/2018 0.0  0.0 - 0.1 K/uL Final  . Immature Granulocytes 11/23/2018 1  % Final  . Abs Immature Granulocytes 11/23/2018 0.04  0.00 - 0.07 K/uL Final   Performed at [facility], [address], [place], [place] [address]  . Magnesium 11/23/2018 2.3  1.7 - 2.4 mg/dL Final   Performed at [facility], [address], [place], [place] [address]    Assessment:  CAPRISHA BRIDGETT is a 73 y.o. female with stage IIIB ascending colon cancer s/p hemicolectomy on 07/06/2018.  Pathology revealed a moderately differentiated adenocarcinoma with invasion through the visceral peritoneum.  There was metastatic carcinoma in 1 of 44 regional lymph nodes.  Margins were negative.  There was no lymphovascular invasion or perineural invasion was identified.  There were no tumor deposits identified. Pathologic stage was pT4a pN1a.  MSI was stable.  CEA was 3.0 on 08/03/2018  Abdomen and pelvis CT on 06/17/2018 revealed a 3.2 cm constricting apple core type ascending  colon cancer near the hepatic flexure.  There were small pericolonic lymph nodes but no overt adenopathy.  There was a 3.5 mm left hepatic lobe lesion characterized as hemangioma on MRI.  Chest CT on 06/17/2018 revealed small subcentimeter pulmonary nodules, nonspecific.  She is s/p 8 cycles of FOLFOX chemotherapy (08/03/2018 - 11/23/2018).  Because of her age and frail status, oxaliplatin was reduced to 65 mg/m2.   She had significant chemotherapy induced diarrhea, thus her 5-FU bolus was deleted after cycle #4.    She has mild chemotherapy induced anemia.  She has a history of iron deficiency requiring IV iron in the past.  B12 was 617 and folate 18.1 on 08/17/2018.  Ferritin was 260 with an iron saturation of 34% and a TIBC of 309 on 10/19/2018.  She has schizophrenia.  Symptomatically,    Plan: 1.  Labs today: CBC with diff, CMP, Mg.   2.  Stage IIIB ascending colon cancer:  Discuss plan to reinitiate chemotherapy..  Patient is s/p 6 cycles of modified FOLFOX chemotherapy.  She does not receive bolus 5FU.  Oxaliplatin is reduced to 65 mg/m2.  Cycle #7 mFOLFOX chemotherapy today.  RTC in 2 days for disconnect.  Check nadir counts in 1 week. 3.  Dehydration and hypokalemia:  Resolved. 4.  Oral ulcers:  Resolved.    Patient notes residual oral tenderness.  Continue  Magic Mouthwash BID for prophylaxis. 5.  RTC in 1 week for labs (CBC with diff). 6.  RTC in 2 weeks for MD assessment, labs (CBC with diff, CMP, Mg), and cycle #8 FOLFOX chemotherapy.   Lequita Asal, MD  12/09/2018, 5:30 AM   I saw and evaluated the patient, participating in the key portions of the service and reviewing pertinent diagnostic studies and records.  I reviewed the nurse practitioner's note and agree with the findings and the plan.  The assessment and plan were discussed with the patient.  Additional diagnostic studies of *** are needed to clarify *** and would change the clinical management.  A few  ***multiple questions were asked by the patient and answered.   Nolon Stalls, MD 12/09/2018,5:30 AM

## 2018-12-12 NOTE — Progress Notes (Signed)
[facility] day:  12/14/2018  Chief Complaint: Kathy [name] is a 73 y.o. female with stage IIIB colon cancer who is seen for assessment prior to cycle #9 FOLFOX chemotherapy.   HPI:  The patient was last seen in the medical oncology clinic on 11/23/2018.  At that time, she was doing well.  She denied any neuropathy.  Weight was up 2 pounds.  She received cycle #8 FOLFOX chemotherapy.   During the interim, she notes increased pain in her lower back.  She notes compression fractures in her back.  Bone density on 04/22/2016 revealed osteopenia with a T-score of -2.4 in the right femoral hip and - 1.8 in AP spine L1-L4.  She denies any numbness or tingling in her legs.  She denies any bowel or bladder issues.  She has pain with ambulation.  She has taken Tramadol for her pain without improvement today.  She notes that she is scheduled for lumbar spine MRI on 12/17/2018.   Past Medical History:  Diagnosis Date  . Arthritis    hips  . B12 deficiency   . Colon cancer ([place]) 07/06/2018  . 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 COPD (chronic obstructive pulmonary disease) ([place])   . Fibrocystic breast disease   . Mediastinal mass    removed 1/16  . Motion sickness    fair rides  . Osteoporosis   . Schizophrenia ([place])   . Vitamin D deficiency   . Wears dentures    partial upper and lower    Past Surgical History:  Procedure Laterality Date  . BREAST BIOPSY Left    neg  . BREAST BIOPSY Right 05/04/13   [place] bx/clip-neg  . COLONOSCOPY    . COLONOSCOPY WITH PROPOFOL N/A 06/10/2018   Procedure: COLONOSCOPY WITH PROPOFOL;  Surgeon: Toledo, [name], MD;  Location: [facility];  Service: Gastroenterology;  Laterality: N/A;  . ESOPHAGOGASTRODUODENOSCOPY N/A 06/10/2018   Procedure: ESOPHAGOGASTRODUODENOSCOPY (EGD);  Surgeon: Toledo, [name], MD;  Location: [facility];  Service: Gastroenterology;  Laterality: N/A;  . HALLUX VALGUS AKIN Right  11/20/2016   Procedure: HALLUX VALGUS AKIN;  Surgeon: Samara Deist, DPM;  Location: [place];  Service: Podiatry;  Laterality: Right;  . HALLUX VALGUS AUSTIN Right 11/20/2016   Procedure: HALLUX VALGUS AUSTIN  Alroy Dust) right;  Surgeon: Samara Deist, DPM;  Location: [place];  Service: Podiatry;  Laterality: Right;  IVA with Popliteal  . HALLUX VALGUS AUSTIN Left 01/15/2017   Procedure: HALLUX VALGUS AUSTIN  Correction left foot  Iva Popiteal;  Surgeon: Samara Deist, DPM;  Location: [place];  Service: Podiatry;  Laterality: Left;  IVA Popliteal   . LAPAROSCOPIC RIGHT COLECTOMY N/A 07/06/2018   Procedure: LAPAROSCOPIC COLECTOMY;  Surgeon: Benjamine Sprague, DO;  Location: [facility];  Service: General;  Laterality: N/A;  . MEDIASTINAL MASS EXCISION Left 11/09/2014   Dr. Wynelle [place], [place]  . PORTACATH PLACEMENT Right 07/30/2018   Procedure: INSERTION PORT-A-CATH;  Surgeon: Benjamine Sprague, DO;  Location: [facility];  Service: General;  Laterality: Right;  . THORACOSCOPY Left 11/09/2014   with excision mediastinal mass  . TUBAL LIGATION      Family History  Problem Relation Age of Onset  . Cancer Father   . Breast cancer Neg Hx     Social History:  reports that she quit smoking about 4 years ago. She has never used smokeless tobacco. She reports that she does not drink alcohol or use drugs.  The patient is accompanied by by her son, Kathy [name], today.  Allergies:  Allergies  Allergen Reactions  . Biaxin [Clarithromycin] Shortness Of Breath  . Levofloxacin     Muscle pain  . Penicillins Rash    Has patient had a PCN reaction causing immediate rash, facial/tongue/throat swelling, SOB or lightheadedness with hypotension: no Has patient had a PCN reaction causing severe rash involving mucus membranes or skin necrosis: no Has patient had a PCN reaction that required hospitalization: no Has patient had a PCN reaction occurring within the last 10 years: no If all of the above  answers are "NO", then may proceed with Cephalosporin use.     Current Medications: Current Outpatient Medications  Medication Sig Dispense Refill  . ASPIRIN 81 PO Take 81 mg by mouth daily.     . Cholecalciferol (VITAMIN D3) 2000 units TABS Take 2,000 Units by mouth daily.     . potassium chloride SA (K-DUR,KLOR-CON) 20 MEQ tablet TAKE 2 TABLET BY MOUTH TWICE DAILY 360 tablet 0  . Potassium Gluconate 550 MG TABS Take 1 tablet by mouth daily.    . predniSONE (DELTASONE) 5 MG tablet     . PROCYCLIDINE HCL PO Take 5 mg by mouth 3 (three) times daily. [facility])     . thiothixene (NAVANE) 5 MG capsule Take 10 mg by mouth at bedtime.     . traMADol (ULTRAM) 50 MG tablet     . vitamin B-12 (CYANOCOBALAMIN) 1000 MCG tablet Take 1,000 mcg by mouth daily.     . benzonatate (TESSALON) 100 MG capsule Take 1 capsule (100 mg total) by mouth 3 (three) times daily as needed for cough. (Patient not taking: Reported on 11/23/2018) 60 capsule 0  . conjugated estrogens (PREMARIN) vaginal cream Place 1 Applicatorful vaginally 2 (two) times a week.     . cyclobenzaprine (FLEXERIL) 5 MG tablet Take 1 tablet (5 mg total) by mouth 3 (three) times daily as needed for muscle spasms. (Patient not taking: Reported on 12/14/2018) 30 tablet 0  . dexamethasone (DECADRON) 4 MG tablet Take 2 tablets (8 mg total) by mouth daily. Start the day after chemotherapy for 2 days. Take with food. (Patient not taking: Reported on 11/23/2018) 30 tablet 1  . ibuprofen (ADVIL,MOTRIN) 800 MG tablet Take 1 tablet (800 mg total) by mouth every 8 (eight) hours as needed for mild pain or moderate pain. (Patient not taking: Reported on 11/23/2018) 30 tablet 0  . lidocaine-prilocaine (EMLA) cream APPLY EXTERNALLY TO THE AFFECTED AREA 1 TIME (Patient not taking: Reported on 11/23/2018) 30 g 0  . LORazepam (ATIVAN) 0.5 MG tablet Take 1 tablet (0.5 mg total) by mouth every 6 (six) hours as needed (Nausea or vomiting). (Patient not taking: Reported on  11/23/2018) 30 tablet 0  . magic mouthwash w/lidocaine SOLN Take 10 mLs by mouth 3 (three) times daily as needed. (Patient not taking: Reported on 12/14/2018) 450 mL 0  . ondansetron (ZOFRAN) 8 MG tablet Take 1 tablet (8 mg total) by mouth 2 (two) times daily as needed for refractory nausea / vomiting. Start on day 3 after chemotherapy. (Patient not taking: Reported on 11/23/2018) 30 tablet 1  . polyethylene glycol (MIRALAX / GLYCOLAX) packet Take 17 g by mouth daily as needed.     . senna (SENOKOT) 8.6 MG tablet Take 1 tablet by mouth daily as needed.      No current facility-administered medications for this visit.    Facility-Administered Medications Ordered in Other Visits  Medication Dose Route  Frequency Provider Last Rate Last Dose  . heparin lock flush 100 unit/mL  500 Units Intravenous Once Sindy Guadeloupe, MD      . heparin lock flush 100 unit/mL  500 Units Intravenous Once Corcoran, Melissa C, MD      . heparin lock flush 100 unit/mL  500 Units Intravenous Once Corcoran, Melissa C, MD      . heparin lock flush 100 unit/mL  500 Units Intravenous Once Corcoran, Melissa C, MD      . sodium chloride flush (NS) 0.9 % injection 10 mL  10 mL Intravenous PRN Sindy Guadeloupe, MD   10 mL at 10/05/18 0845  . sodium chloride flush (NS) 0.9 % injection 10 mL  10 mL Intravenous PRN Nolon Stalls C, MD   10 mL at 11/09/18 0840  . sodium chloride flush (NS) 0.9 % injection 10 mL  10 mL Intravenous PRN Lequita Asal, MD   10 mL at 11/23/18 0843  . sodium chloride flush (NS) 0.9 % injection 10 mL  10 mL Intravenous PRN Nolon Stalls C, MD   10 mL at 12/14/18 1018    Review of Systems:  GENERAL:  Acute pain.  No fevers, sweats or weight loss. PERFORMANCE STATUS (ECOG):  2 HEENT:  No visual changes, runny nose, sore throat, mouth sores or tenderness. Lungs: No shortness of breath or cough.  No hemoptysis. Cardiac:  No chest pain, palpitations, orthopnea, or PND. GI:  No nausea, vomiting,  diarrhea, constipation, melena or hematochezia. GU:  No urgency, frequency, dysuria, or hematuria. Musculoskeletal:  Back pain.  Pain with ambulation.  No joint pain.  No muscle tenderness.  Osteopenia. Extremities:  No pain or swelling. Skin:  No rashes or skin changes. Neuro:  No headache, numbness or weakness, balance or coordination issues. Endocrine:  No diabetes, thyroid issues, hot flashes or night sweats. Psych:  No mood changes, depression or anxiety. Pain:  Back pain, unrelieved with Tramadol. Review of systems:  All other systems reviewed and found to be negative.   Physical Exam: Blood pressure (!) 155/72, pulse 94, temperature (!) 97.3 F (36.3 C), temperature source Tympanic, resp. rate 18, height 5' 4"  (1.626 m), SpO2 100 %. GENERAL:  Well developed, well nourished, woman sitting in a wheelchair in the exam room in moderate distress. MENTAL STATUS:  Alert and oriented to person, place and time. HEAD:  Graying short brown hair.  Normocephalic, atraumatic, face symmetric, no Cushingoid features. EYES:  Blue eyes.  No conjunctivitis or scleral icterus. BACK:  Pain on palpation L1 -L3. EXTREMITIES: No edema, no skin discoloration or tenderness.  No palpable cords. NEUROLOGICAL: Able to wiggle toes. PSYCH:  Appropriate.    Infusion on 12/14/2018  Component Date Value Ref Range Status  . Magnesium 12/14/2018 2.2  1.7 - 2.4 mg/dL Final   Performed at [facility], [address] [place], [place] [address]  . Sodium 12/14/2018 140  135 - 145 mmol/L Final  . Potassium 12/14/2018 3.8  3.5 - 5.1 mmol/L Final  . Chloride 12/14/2018 103  98 - 111 mmol/L Final  . CO2 12/14/2018 25  22 - 32 mmol/L Final  . Glucose, Bld 12/14/2018 120* 70 - 99 mg/dL Final  . BUN 12/14/2018 21  8 - 23 mg/dL Final  . Creatinine, Ser 12/14/2018 0.65  0.44 - 1.00 mg/dL Final  . Calcium 12/14/2018 9.3  8.9 - 10.3 mg/dL Final  . Total Protein 12/14/2018 6.8  6.5 - 8.1 g/dL Final  .  Albumin  12/14/2018 3.4* 3.5 - 5.0 g/dL Final  . AST 12/14/2018 26  15 - 41 U/L Final  . ALT 12/14/2018 20  0 - 44 U/L Final  . Alkaline Phosphatase 12/14/2018 189* 38 - 126 U/L Final  . Total Bilirubin 12/14/2018 0.3  0.3 - 1.2 mg/dL Final  . GFR calc non Af Amer 12/14/2018 >60  >60 mL/min Final  . GFR calc Af Amer 12/14/2018 >60  >60 mL/min Final  . Anion gap 12/14/2018 12  5 - 15 Final   Performed at [facility], [address], [place], [place] [address]  . WBC 12/14/2018 11.9* 4.0 - 10.5 K/uL Final  . RBC 12/14/2018 3.86* 3.87 - 5.11 MIL/uL Final  . Hemoglobin 12/14/2018 12.7  12.0 - 15.0 g/dL Final  . HCT 12/14/2018 39.1  36.0 - 46.0 % Final  . MCV 12/14/2018 101.3* 80.0 - 100.0 fL Final  . MCH 12/14/2018 32.9  26.0 - 34.0 pg Final  . MCHC 12/14/2018 32.5  30.0 - 36.0 g/dL Final  . RDW 12/14/2018 13.5  11.5 - 15.5 % Final  . Platelets 12/14/2018 321  150 - 400 K/uL Final  . nRBC 12/14/2018 0.0  0.0 - 0.2 % Final  . Neutrophils Relative % 12/14/2018 67  % Final  . Neutro Abs 12/14/2018 8.0* 1.7 - 7.7 K/uL Final  . Lymphocytes Relative 12/14/2018 13  % Final  . Lymphs Abs 12/14/2018 1.6  0.7 - 4.0 K/uL Final  . Monocytes Relative 12/14/2018 14  % Final  . Monocytes Absolute 12/14/2018 1.6* 0.1 - 1.0 K/uL Final  . Eosinophils Relative 12/14/2018 0  % Final  . Eosinophils Absolute 12/14/2018 0.0  0.0 - 0.5 K/uL Final  . Basophils Relative 12/14/2018 1  % Final  . Basophils Absolute 12/14/2018 0.1  0.0 - 0.1 K/uL Final  . Immature Granulocytes 12/14/2018 5  % Final  . Abs Immature Granulocytes 12/14/2018 0.59* 0.00 - 0.07 K/uL Final   Performed at [facility], [address] [place], [place] [address]    Assessment:  KAITLYNE FRIEDHOFF is a 73 y.o. female with stage IIIB ascending colon cancer s/p hemicolectomy on 07/06/2018.  Pathology revealed a moderately differentiated adenocarcinoma with invasion through the visceral peritoneum.  There was metastatic carcinoma  in 1 of 44 regional lymph nodes.  Margins were negative.  There was no lymphovascular invasion or perineural invasion was identified.  There were no tumor deposits identified. Pathologic stage was pT4a pN1a.  MSI was stable.  CEA was 3.0 on 08/03/2018  Abdomen and pelvis CT on 06/17/2018 revealed a 3.2 cm constricting apple core type ascending colon cancer near the hepatic flexure.  There were small pericolonic lymph nodes but no overt adenopathy.  There was a 3.5 mm left hepatic lobe lesion characterized as hemangioma on MRI.  Chest CT on 06/17/2018 revealed small subcentimeter pulmonary nodules, nonspecific.  She is s/p 8 cycles of FOLFOX chemotherapy (08/03/2018 - 11/23/2018).  Because of her age and frail status, oxaliplatin was reduced to 65 mg/m2.   She had significant chemotherapy induced diarrhea, thus her 5-FU bolus was deleted after cycle #4.    She has mild chemotherapy induced anemia.  She has a history of iron deficiency requiring IV iron in the past.  B12 was 617 and folate 18.1 on 08/17/2018.  Ferritin was 260 with an iron saturation of 34% and a TIBC of 309 on 10/19/2018.  She has schizophrenia.  Symptomatically, she has increased back pain  unrelieved by Tramadol.   Plan: 1.   Labs today: CBC with diff, CMP, Mg. 2.   Stage IIIB ascending colon cancer:  Patient is s/p 8 cycles of modified FOLFOX chemotherapy.  She does not receive bolus 5FU.  Oxaliplatin was reduced to 65 mg/m2.  Postpone chemotherapy today secondary to acute back pain. 3.   Lumbar spine pain, acute  Suspect acute compression fracture.  Lumbar spine MRI today with pain control.  Discuss likely plan for kyphoplasty.  Call report to ER. 4.   Patient to go to ER. 5.   Patient to call and reschedule appt for MD assessment, labs (CBC with diff, CMP), and cycle #9 FOLFOX chemotherapy.   Lequita Asal, MD  12/14/2018, 11:51 AM

## 2018-12-14 ENCOUNTER — Inpatient Hospital Stay
Admission: EM | Admit: 2018-12-14 | Discharge: 2018-12-19 | DRG: 478 | Disposition: A | Discharge status: Skilled Nursing Facility | Payer: Medicare Other | Attending: Internal Medicine | Admitting: Internal Medicine

## 2018-12-14 ENCOUNTER — Inpatient Hospital Stay (HOSPITAL_BASED_OUTPATIENT_CLINIC_OR_DEPARTMENT_OTHER): Payer: Medicare Other | Admitting: Hematology and Oncology

## 2018-12-14 ENCOUNTER — Ambulatory Visit: Payer: Medicare Other

## 2018-12-14 ENCOUNTER — Emergency Department: Payer: Medicare Other

## 2018-12-14 ENCOUNTER — Inpatient Hospital Stay: Payer: Medicare Other | Attending: Hematology and Oncology

## 2018-12-14 ENCOUNTER — Other Ambulatory Visit: Payer: Medicare Other

## 2018-12-14 ENCOUNTER — Inpatient Hospital Stay: Payer: Medicare Other

## 2018-12-14 ENCOUNTER — Other Ambulatory Visit: Payer: Self-pay

## 2018-12-14 ENCOUNTER — Encounter: Payer: Self-pay | Admitting: Emergency Medicine

## 2018-12-14 ENCOUNTER — Encounter: Payer: Self-pay | Admitting: Hematology and Oncology

## 2018-12-14 ENCOUNTER — Ambulatory Visit: Payer: Medicare Other | Admitting: Hematology and Oncology

## 2018-12-14 VITALS — BP 155/72 | HR 94 | Temp 97.3°F | Resp 18 | Ht 64.0 in

## 2018-12-14 DIAGNOSIS — J449 Chronic obstructive pulmonary disease, unspecified: Secondary | ICD-10-CM | POA: Diagnosis present

## 2018-12-14 DIAGNOSIS — Z5111 Encounter for antineoplastic chemotherapy: Secondary | ICD-10-CM

## 2018-12-14 DIAGNOSIS — Z881 Allergy status to other antibiotic agents status: Secondary | ICD-10-CM

## 2018-12-14 DIAGNOSIS — D6481 Anemia due to antineoplastic chemotherapy: Secondary | ICD-10-CM | POA: Diagnosis not present

## 2018-12-14 DIAGNOSIS — E876 Hypokalemia: Secondary | ICD-10-CM | POA: Diagnosis present

## 2018-12-14 DIAGNOSIS — M545 Low back pain, unspecified: Secondary | ICD-10-CM

## 2018-12-14 DIAGNOSIS — Z9221 Personal history of antineoplastic chemotherapy: Secondary | ICD-10-CM

## 2018-12-14 DIAGNOSIS — Z79899 Other long term (current) drug therapy: Secondary | ICD-10-CM | POA: Diagnosis not present

## 2018-12-14 DIAGNOSIS — Z7952 Long term (current) use of systemic steroids: Secondary | ICD-10-CM

## 2018-12-14 DIAGNOSIS — Z7982 Long term (current) use of aspirin: Secondary | ICD-10-CM

## 2018-12-14 DIAGNOSIS — M818 Other osteoporosis without current pathological fracture: Secondary | ICD-10-CM | POA: Diagnosis present

## 2018-12-14 DIAGNOSIS — F209 Schizophrenia, unspecified: Secondary | ICD-10-CM | POA: Diagnosis present

## 2018-12-14 DIAGNOSIS — Z79890 Hormone replacement therapy (postmenopausal): Secondary | ICD-10-CM

## 2018-12-14 DIAGNOSIS — E559 Vitamin D deficiency, unspecified: Secondary | ICD-10-CM | POA: Diagnosis present

## 2018-12-14 DIAGNOSIS — K8020 Calculus of gallbladder without cholecystitis without obstruction: Secondary | ICD-10-CM | POA: Diagnosis present

## 2018-12-14 DIAGNOSIS — C182 Malignant neoplasm of ascending colon: Secondary | ICD-10-CM

## 2018-12-14 DIAGNOSIS — E538 Deficiency of other specified B group vitamins: Secondary | ICD-10-CM | POA: Diagnosis not present

## 2018-12-14 DIAGNOSIS — W19XXXA Unspecified fall, initial encounter: Secondary | ICD-10-CM | POA: Diagnosis present

## 2018-12-14 DIAGNOSIS — Z883 Allergy status to other anti-infective agents status: Secondary | ICD-10-CM

## 2018-12-14 DIAGNOSIS — Z87891 Personal history of nicotine dependence: Secondary | ICD-10-CM

## 2018-12-14 DIAGNOSIS — S32000A Wedge compression fracture of unspecified lumbar vertebra, initial encounter for closed fracture: Secondary | ICD-10-CM | POA: Diagnosis present

## 2018-12-14 DIAGNOSIS — M549 Dorsalgia, unspecified: Secondary | ICD-10-CM

## 2018-12-14 DIAGNOSIS — Z880 Allergy status to penicillin: Secondary | ICD-10-CM

## 2018-12-14 DIAGNOSIS — M4726 Other spondylosis with radiculopathy, lumbar region: Secondary | ICD-10-CM | POA: Diagnosis not present

## 2018-12-14 DIAGNOSIS — N390 Urinary tract infection, site not specified: Secondary | ICD-10-CM | POA: Diagnosis present

## 2018-12-14 DIAGNOSIS — M8448XA Pathological fracture, other site, initial encounter for fracture: Secondary | ICD-10-CM | POA: Diagnosis present

## 2018-12-14 DIAGNOSIS — Z85038 Personal history of other malignant neoplasm of large intestine: Secondary | ICD-10-CM

## 2018-12-14 DIAGNOSIS — Z419 Encounter for procedure for purposes other than remedying health state, unspecified: Secondary | ICD-10-CM

## 2018-12-14 DIAGNOSIS — Z9049 Acquired absence of other specified parts of digestive tract: Secondary | ICD-10-CM

## 2018-12-14 LAB — CBC WITH DIFFERENTIAL/PLATELET
Abs Immature Granulocytes: 0.59 10*3/uL — ABNORMAL HIGH (ref 0.00–0.07)
Abs Immature Granulocytes: 0.46 10*3/uL — ABNORMAL HIGH (ref 0.00–0.07)
Basophils Absolute: 0.1 10*3/uL (ref 0.0–0.1)
Basophils Absolute: 0.1 10*3/uL (ref 0.0–0.1)
Basophils Relative: 1 %
Basophils Relative: 1 %
Eosinophils Absolute: 0 10*3/uL (ref 0.0–0.5)
Eosinophils Absolute: 0 10*3/uL (ref 0.0–0.5)
Eosinophils Relative: 0 %
Eosinophils Relative: 0 %
HCT: 39.1 % (ref 36.0–46.0)
HEMATOCRIT: 38.9 % (ref 36.0–46.0)
Hemoglobin: 12.7 g/dL (ref 12.0–15.0)
Hemoglobin: 12.4 g/dL (ref 12.0–15.0)
Immature Granulocytes: 5 %
Immature Granulocytes: 4 %
Lymphocytes Relative: 13 %
Lymphocytes Relative: 9 %
Lymphs Abs: 1.6 10*3/uL (ref 0.7–4.0)
Lymphs Abs: 1 10*3/uL (ref 0.7–4.0)
MCH: 32.9 pg (ref 26.0–34.0)
MCH: 32.5 pg (ref 26.0–34.0)
MCHC: 32.5 g/dL (ref 30.0–36.0)
MCHC: 31.9 g/dL (ref 30.0–36.0)
MCV: 101.3 fL — ABNORMAL HIGH (ref 80.0–100.0)
MCV: 102.1 fL — ABNORMAL HIGH (ref 80.0–100.0)
Monocytes Absolute: 1.6 10*3/uL — ABNORMAL HIGH (ref 0.1–1.0)
Monocytes Absolute: 1.2 10*3/uL — ABNORMAL HIGH (ref 0.1–1.0)
Monocytes Relative: 14 %
Monocytes Relative: 10 %
NEUTROS ABS: 9 10*3/uL — AB (ref 1.7–7.7)
Neutro Abs: 8 10*3/uL — ABNORMAL HIGH (ref 1.7–7.7)
Neutrophils Relative %: 67 %
Neutrophils Relative %: 76 %
Platelets: 321 10*3/uL (ref 150–400)
Platelets: 301 10*3/uL (ref 150–400)
RBC: 3.86 MIL/uL — ABNORMAL LOW (ref 3.87–5.11)
RBC: 3.81 MIL/uL — ABNORMAL LOW (ref 3.87–5.11)
RDW: 13.5 % (ref 11.5–15.5)
RDW: 13.3 % (ref 11.5–15.5)
WBC: 11.9 10*3/uL — ABNORMAL HIGH (ref 4.0–10.5)
WBC: 11.7 10*3/uL — ABNORMAL HIGH (ref 4.0–10.5)
nRBC: 0 % (ref 0.0–0.2)
nRBC: 0 % (ref 0.0–0.2)

## 2018-12-14 LAB — URINALYSIS, COMPLETE (UACMP) WITH MICROSCOPIC
BILIRUBIN URINE: NEGATIVE
Glucose, UA: NEGATIVE mg/dL
Ketones, ur: NEGATIVE mg/dL
Nitrite: NEGATIVE
Protein, ur: NEGATIVE mg/dL
Specific Gravity, Urine: 1.015 (ref 1.005–1.030)
Squamous Epithelial / HPF: >50 — ABNORMAL HIGH (ref 0–5)
WBC, UA: >50 WBC/hpf — ABNORMAL HIGH (ref 0–5)
pH: 6 (ref 5.0–8.0)

## 2018-12-14 LAB — COMPREHENSIVE METABOLIC PANEL
ALBUMIN: 3.2 g/dL — AB (ref 3.5–5.0)
ALT: 20 U/L (ref 0–44)
ALT: 22 U/L (ref 0–44)
AST: 26 U/L (ref 15–41)
AST: 24 U/L (ref 15–41)
Albumin: 3.4 g/dL — ABNORMAL LOW (ref 3.5–5.0)
Alkaline Phosphatase: 189 U/L — ABNORMAL HIGH (ref 38–126)
Alkaline Phosphatase: 172 U/L — ABNORMAL HIGH (ref 38–126)
Anion gap: 12 (ref 5–15)
Anion gap: 9 (ref 5–15)
BUN: 21 mg/dL (ref 8–23)
BUN: 23 mg/dL (ref 8–23)
CO2: 25 mmol/L (ref 22–32)
CO2: 25 mmol/L (ref 22–32)
Calcium: 9.3 mg/dL (ref 8.9–10.3)
Calcium: 9.3 mg/dL (ref 8.9–10.3)
Chloride: 103 mmol/L (ref 98–111)
Chloride: 104 mmol/L (ref 98–111)
Creatinine, Ser: 0.65 mg/dL (ref 0.44–1.00)
Creatinine, Ser: 0.51 mg/dL (ref 0.44–1.00)
GFR calc Af Amer: >60 mL/min (ref >60)
GFR calc Af Amer: >60 mL/min (ref >60)
GFR calc non Af Amer: >60 mL/min (ref >60)
GFR calc non Af Amer: >60 mL/min (ref >60)
Glucose, Bld: 120 mg/dL — ABNORMAL HIGH (ref 70–99)
Glucose, Bld: 133 mg/dL — ABNORMAL HIGH (ref 70–99)
POTASSIUM: 4 mmol/L (ref 3.5–5.1)
Potassium: 3.8 mmol/L (ref 3.5–5.1)
Sodium: 140 mmol/L (ref 135–145)
Sodium: 138 mmol/L (ref 135–145)
Total Bilirubin: 0.3 mg/dL (ref 0.3–1.2)
Total Bilirubin: 0.6 mg/dL (ref 0.3–1.2)
Total Protein: 6.8 g/dL (ref 6.5–8.1)
Total Protein: 6.6 g/dL (ref 6.5–8.1)

## 2018-12-14 LAB — MAGNESIUM: Magnesium: 2.2 mg/dL (ref 1.7–2.4)

## 2018-12-14 MED ORDER — OXYCODONE-ACETAMINOPHEN 5-325 MG PO TABS
1.0000 | ORAL_TABLET | Freq: Once | ORAL | Status: AC
  Administered 2018-12-14: 1 via ORAL
  Filled 2018-12-14: qty 1

## 2018-12-14 MED ORDER — SODIUM CHLORIDE 0.9 % IV BOLUS
500.0000 mL | Freq: Once | INTRAVENOUS | Status: AC
  Administered 2018-12-14: 500 mL via INTRAVENOUS

## 2018-12-14 MED ORDER — SODIUM CHLORIDE 0.9% FLUSH
10.0000 mL | INTRAVENOUS | Status: DC | PRN
  Administered 2018-12-14: 10 mL via INTRAVENOUS
  Filled 2018-12-14: qty 10

## 2018-12-14 MED ORDER — FENTANYL CITRATE (PF) 100 MCG/2ML IJ SOLN
50.0000 ug | Freq: Once | INTRAMUSCULAR | Status: AC
  Administered 2018-12-14: 50 ug via INTRAVENOUS
  Filled 2018-12-14: qty 2

## 2018-12-14 MED ORDER — SODIUM CHLORIDE 0.9 % IV SOLN
1.0000 g | Freq: Once | INTRAVENOUS | Status: AC
  Administered 2018-12-14: 1 g via INTRAVENOUS
  Filled 2018-12-14: qty 10

## 2018-12-14 MED ORDER — HEPARIN SOD (PORK) LOCK FLUSH 100 UNIT/ML IV SOLN: 500.0000 [IU] | Freq: Once | INTRAVENOUS | Status: DC

## 2018-12-14 NOTE — ED Triage Notes (Signed)
Pain in lower back, hx of 2 compression fractures and possibly more per son. Denies falls. Today was due to have chemo for colon cancer today but is here for pain control instead. Oncologist is aware. NAD at this time. Tramadol at 0830 this am and Alieve at 0730 for the pain with very little relief.

## 2018-12-14 NOTE — ED Notes (Signed)
Pt c/o increased pain, believes there is another compression fracture. Pt is able to move extremities and feel in both legs.

## 2018-12-14 NOTE — Progress Notes (Signed)
Patient here for chemotherapy appointment and in a great deal of pain in her back.  Patient reports that she has a fracture and is going to go to the ER after she sees the MD this morning.  Patient's son at her side and verbalizes that he will take patient.  Ice pack applied to lower right back area.  Patient's port accessed for labs and will be left intact for the ER to use.  Son instructed to bring patient back to the office if they discharge her and do not remove the needle.  Son verbalized understanding.

## 2018-12-14 NOTE — ED Provider Notes (Addendum)
[facility] Emergency Department Provider Note  ____________________________________________   I have reviewed the triage vital signs and the nursing notes. Where available I have reviewed prior notes and, if possible and indicated, outside hospital notes.    HISTORY  Chief Complaint Back Pain    HPI Kathy [name] [place] is a 73 y.o. female with a history of schizophrenia, low back pain, COPD colon cancer getting chemotherapy last chemotherapy was the third, known osteoporosis from a bone scan 2 years ago, presents today with low back pain which is been there for almost a month.  She is having trouble walking up because she weak but because it hurts.  She is been given tramadol at home with limited success.  Family state that she was scheduled for an outpatient MRI, but she elected to come here for pain control.  She did receive pain medication on the way in and feels much more comfortable.  She denies any dysuria urinary frequency or incontinence of bowel or bladder.  She does have a history of constipation.  She does not have any complaints of weakness she does states it hurts when she walks.  Patient is somewhat of a limited historian.  History as per her son.  Also her daughter is on face time in the room with [place].    Past Medical History:  Diagnosis Date  . Arthritis    hips  . B12 deficiency   . Colon cancer ([place]) 07/06/2018  . 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 COPD (chronic obstructive pulmonary disease) ([place])   . Fibrocystic breast disease   . Mediastinal mass    removed 1/16  . Motion sickness    fair rides  . Osteoporosis   . Schizophrenia ([place])   . Vitamin D deficiency   . Wears dentures    partial upper and lower    Patient Active Problem List   Diagnosis Date Noted  . Anemia due to antineoplastic chemotherapy 11/08/2018  . Encounter for antineoplastic chemotherapy 10/31/2018  . Iron deficiency anemia 08/03/2018  . Goals of care,  counseling/discussion 07/24/2018  . Colon cancer ([place]) 07/06/2018    Past Surgical History:  Procedure Laterality Date  . BREAST BIOPSY Left    neg  . BREAST BIOPSY Right 05/04/13   [place] bx/clip-neg  . COLONOSCOPY    . COLONOSCOPY WITH PROPOFOL N/A 06/10/2018   Procedure: COLONOSCOPY WITH PROPOFOL;  Surgeon: Toledo, [name], MD;  Location: [facility] ENDOSCOPY;  Service: Gastroenterology;  Laterality: N/A;  . ESOPHAGOGASTRODUODENOSCOPY N/A 06/10/2018   Procedure: ESOPHAGOGASTRODUODENOSCOPY (EGD);  Surgeon: Toledo, [name], MD;  Location: [facility] ENDOSCOPY;  Service: Gastroenterology;  Laterality: N/A;  . HALLUX VALGUS AKIN Right 11/20/2016   Procedure: HALLUX VALGUS AKIN;  Surgeon: Samara Deist, DPM;  Location: [place];  Service: Podiatry;  Laterality: Right;  . HALLUX VALGUS AUSTIN Right 11/20/2016   Procedure: HALLUX VALGUS AUSTIN  Alroy Dust) right;  Surgeon: Samara Deist, DPM;  Location: [place];  Service: Podiatry;  Laterality: Right;  IVA with Popliteal  . HALLUX VALGUS AUSTIN Left 01/15/2017   Procedure: HALLUX VALGUS AUSTIN  Correction left foot  Iva Popiteal;  Surgeon: Samara Deist, DPM;  Location: [place];  Service: Podiatry;  Laterality: Left;  IVA Popliteal   . LAPAROSCOPIC RIGHT COLECTOMY N/A 07/06/2018   Procedure: LAPAROSCOPIC COLECTOMY;  Surgeon: Benjamine Sprague, DO;  Location: [facility];  Service: General;  Laterality: N/A;  . MEDIASTINAL MASS EXCISION Left 11/09/2014   Dr. Wynelle Cleveland, [place]  .  PORTACATH PLACEMENT Right 07/30/2018   Procedure: INSERTION PORT-A-CATH;  Surgeon: Benjamine Sprague, DO;  Location: [facility];  Service: General;  Laterality: Right;  . THORACOSCOPY Left 11/09/2014   with excision mediastinal mass  . TUBAL LIGATION      Prior to Admission medications   Medication Sig Start Date End Date Taking? Authorizing Provider  ASPIRIN 81 PO Take 81 mg by mouth daily.     [provider]  benzonatate (TESSALON) 100 MG capsule Take 1  capsule (100 mg total) by mouth 3 (three) times daily as needed for cough. Patient not taking: Reported on 11/23/2018 10/19/18   Verlon Au, NP  Cholecalciferol (VITAMIN D3) 2000 units TABS Take 2,000 Units by mouth daily.     [provider]  conjugated estrogens (PREMARIN) vaginal cream Place 1 Applicatorful vaginally 2 (two) times a week.     [provider]  cyclobenzaprine (FLEXERIL) 5 MG tablet Take 1 tablet (5 mg total) by mouth 3 (three) times daily as needed for muscle spasms. Patient not taking: Reported on 12/14/2018 11/23/18   Lequita Asal, MD  dexamethasone (DECADRON) 4 MG tablet Take 2 tablets (8 mg total) by mouth daily. Start the day after chemotherapy for 2 days. Take with food. Patient not taking: Reported on 11/23/2018 07/24/18   Sindy Guadeloupe, MD  ibuprofen (ADVIL,MOTRIN) 800 MG tablet Take 1 tablet (800 mg total) by mouth every 8 (eight) hours as needed for mild pain or moderate pain. Patient not taking: Reported on 11/23/2018 07/30/18   Benjamine Sprague, DO  lidocaine-prilocaine (EMLA) cream APPLY EXTERNALLY TO THE AFFECTED AREA 1 TIME Patient not taking: Reported on 11/23/2018 09/23/18   Sindy Guadeloupe, MD  LORazepam (ATIVAN) 0.5 MG tablet Take 1 tablet (0.5 mg total) by mouth every 6 (six) hours as needed (Nausea or vomiting). Patient not taking: Reported on 11/23/2018 07/24/18   Sindy Guadeloupe, MD  magic mouthwash w/lidocaine SOLN Take 10 mLs by mouth 3 (three) times daily as needed. Patient not taking: Reported on 12/14/2018 11/25/18   Karen Kitchens, NP  ondansetron (ZOFRAN) 8 MG tablet Take 1 tablet (8 mg total) by mouth 2 (two) times daily as needed for refractory nausea / vomiting. Start on day 3 after chemotherapy. Patient not taking: Reported on 11/23/2018 07/24/18   Sindy Guadeloupe, MD  polyethylene glycol [facility] / Floria Raveling) packet Take 17 g by mouth daily as needed.     [provider]  potassium chloride SA (K-DUR,KLOR-CON) 20 MEQ tablet TAKE 2  TABLET BY MOUTH TWICE DAILY 09/25/18   Jacquelin Hawking, NP  Potassium Gluconate 550 MG TABS Take 1 tablet by mouth daily.    [provider]  predniSONE (DELTASONE) 5 MG tablet  12/07/18   [provider]  PROCYCLIDINE HCL PO Take 5 mg by mouth 3 (three) times daily. [facility])     [provider]  senna (SENOKOT) 8.6 MG tablet Take 1 tablet by mouth daily as needed.     [provider]  thiothixene (NAVANE) 5 MG capsule Take 10 mg by mouth at bedtime.     [provider]  traMADol Veatrice Bourbon) 50 MG tablet  12/07/18   [provider]  vitamin B-12 (CYANOCOBALAMIN) 1000 MCG tablet Take 1,000 mcg by mouth daily.     [provider]    Allergies Biaxin [clarithromycin]; Levofloxacin; and Penicillins  Family History  Problem Relation Age of Onset  . Cancer Father   . Breast cancer Neg  Hx     Social History Social History   Tobacco Use  . Smoking status: Former Smoker    Last attempt to quit: 10/18/2014    Years since quitting: 4.1  . Smokeless tobacco: Never Used  Substance Use Topics  . Alcohol use: No  . Drug use: Never    Review of Systems Constitutional: No fever/chills Eyes: No visual changes. ENT: No sore throat. No stiff neck no neck pain Cardiovascular: Denies chest pain. Respiratory: Denies shortness of breath. Gastrointestinal:   no vomiting.  No diarrhea.  No constipation. Genitourinary: Negative for dysuria. Musculoskeletal: Negative lower extremity swelling Skin: Negative for rash. Neurological: Negative for severe headaches, focal weakness or numbness.   ____________________________________________   PHYSICAL EXAM:  VITAL SIGNS: ED Triage Vitals  Enc Vitals Group     BP 12/14/18 1258 (!) 143/77     Pulse Rate 12/14/18 1258 91     Resp 12/14/18 1630 18     Temp 12/14/18 1258 97.9 F (36.6 C)     Temp Source 12/14/18 1258 Oral     SpO2 12/14/18 1258 96 %     Weight 12/14/18 1259 150 lb (68  kg)     Height 12/14/18 1259 5\' 4"  (1.626 m)     Head Circumference --      Peak Flow --      Pain Score 12/14/18 1306 10     Pain Loc --      Pain Edu? --      Excl. in [place]? --     Constitutional: Alert and oriented. Well appearing and in no acute distress. Eyes: Conjunctivae are normal Head: Atraumatic HEENT: No congestion/rhinnorhea. Mucous membranes are moist.  Oropharynx non-erythematous Neck:   Nontender with no meningismus, no masses, no stridor Cardiovascular: Normal rate, regular rhythm. Grossly normal heart sounds.  Good peripheral circulation. Respiratory: Normal respiratory effort.  No retractions. Lungs CTAB. Abdominal: Soft and nontender. No distention. No guarding no rebound Back:  There is no focal tenderness or step off.  there is no midline tenderness there are no lesions noted. there is no CVA tenderness Musculoskeletal: No lower extremity tenderness, no upper extremity tenderness. No joint effusions, no DVT signs strong distal pulses no edema Neurologic:  Normal speech and language. No gross focal neurologic deficits are appreciated.  Skin:  Skin is warm, dry and intact. No rash noted. Psychiatric: Mood and affect are normal. Speech and behavior are normal.  ____________________________________________   LABS (all labs ordered are listed, but only abnormal results are displayed)  Labs Reviewed  COMPREHENSIVE METABOLIC PANEL  CBC WITH DIFFERENTIAL/PLATELET  URINALYSIS, COMPLETE (UACMP) WITH MICROSCOPIC    Pertinent labs  results that were available during my care of the patient were reviewed by me and considered in my medical decision making (see chart for details). ____________________________________________  EKG  I personally interpreted any EKGs ordered by me or triage  ____________________________________________  RADIOLOGY  Pertinent labs & imaging results that were available during my care of the patient were reviewed by me and considered in my  medical decision making (see chart for details). If possible, patient and/or family made aware of any abnormal findings.  Dg Lumbar Spine Complete  Result Date: 12/14/2018 CLINICAL DATA:  Lower back pain. EXAM: LUMBAR SPINE - COMPLETE 4+ VIEW COMPARISON:  CT scan of June 17, 2018. FINDINGS: Old L2 compression fracture is noted. Moderate compression deformities of the L3 and L4 vertebral bodies are also noted which are new since prior exam. Moderate  degenerative disc disease is noted at L5-S1. Degenerative changes are seen involving posterior facet joints of L4-5 and L5-S1. Atherosclerosis of abdominal aorta is noted. IMPRESSION: Stable old L2 compression fracture is noted. New L3 and L4 compression fractures are noted that appear to be at least subacute or chronic, but acute fracture can not be excluded. MRI may be performed for further evaluation. Electronically Signed   By: Marijo Conception, M.D.   On: 12/14/2018 14:55   ____________________________________________    PROCEDURES  Procedure(s) performed: None  Procedures  Critical Care performed: None  ____________________________________________   INITIAL IMPRESSION / ASSESSMENT AND PLAN / ED COURSE  Pertinent labs & imaging results that were available during my care of the patient were reviewed by me and considered in my medical decision making (see chart for details).  Patient has a nonfocal exam, she has low back pain from 2 new fractures nothing to suggest referred abdominal pain.  She states that sometimes she has urinary hesitancy and she would like me to check and see if she has UTI but she does not have any saddle anesthesia and she does not want a rectal exam.  I offered an MRI and they refused.  Patient states she does not want a lie on the MRI surface and she is had them before.  Patient is on tramadol as an outpatient, I think that is a generally speaking medication that could be improved upon.  Percocet may be better for her.   They also would like follow-up with Dr. Rudene Christians.  If the patient is truly unable to ambulate from pain, we may need to admit her to the hospital as she has no home health nurse but we will talk to social work about the     ----------------------------------------- 7:24 PM on 12/14/2018 -----------------------------------------  Patient's back pain is well controlled with Percocet, she wants to go home she does not want to be admitted, talked about CT even though she has no abdominal pain and she declined.  I have no real suspicion that she has a kidney stone.  However, does appear that she has UTI.  There is some evidence of possible contamination but given her worsening pain over the last week, I do not think is unreasonable to give her antibiotics.  She has no evidence of history of allergic reaction to cephalosporins and she has a rash only with penicillins making cephalosporins considered to be safe.  Patient and family would rather take her home and have her follow-up closely with orthopedic surgery.  There is been no recent fall she is neurologically intact she declines admission she declines MRI.  ----------------------------------------- 9:23 PM on 12/14/2018 -----------------------------------------  Pain medications patient did attempt to walk she is however having pain, and prefer to be admitted at this time.  Given this I did talk to her again and she does agree to CT scan which we obtained.  CT scan does show multiple fractures.  She is able to lift her legs with no difficulty and I do not see any evidence of acute cauda equina syndrome.  She continues to decline offered MRI but may change after she is admitted, and likely should.  Given that she is being admitted I think she should have an MRI of her back for further evaluation especially given CT scan findings.  I talked to Dr. Jannifer Franklin who agrees with management and will admit ____________________________________________   FINAL CLINICAL  IMPRESSION(S) / ED DIAGNOSES  Final diagnoses:  None  This chart was dictated using voice recognition software.  Despite best efforts to proofread,  errors can occur which can change meaning.      Schuyler Amor, MD 12/14/18 1648    Schuyler Amor, MD 12/14/18 Remus Loffler    Schuyler Amor, MD 12/14/18 2123

## 2018-12-14 NOTE — H&P (Signed)
[facility] NAME: Kathy [name]    MR#:  [id number]  DATE OF BIRTH:  12-Feb-1946  DATE OF ADMISSION:  12/14/2018  PRIMARY CARE PHYSICIAN: Clarisse Gouge, MD   REQUESTING/REFERRING PHYSICIAN: Burlene Arnt, MD  CHIEF COMPLAINT:   Chief Complaint  Patient presents with  . Back Pain    HISTORY OF PRESENT ILLNESS:  Kathy [name]  is a 73 y.o. female who presents with chief complaint as above.  Patient had a fall a week ago and has had back pain since that time.  She came into the ED today because the pain is gotten progressively worse.  Here she is found to have a UTI, and also multiple lumbosacral spinal fractures including compression fractures.  She was given antibiotics, and hospitalist were called for admission and further treatment as the patient is unable to ambulate at this time due to her pain  PAST MEDICAL HISTORY:   Past Medical History:  Diagnosis Date  . Arthritis    hips  . B12 deficiency   . Colon cancer ([place]) 07/06/2018  . 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 COPD (chronic obstructive pulmonary disease) ([place])   . Fibrocystic breast disease   . Mediastinal mass    removed 1/16  . Motion sickness    fair rides  . Osteoporosis   . Schizophrenia ([place])   . Vitamin D deficiency   . Wears dentures    partial upper and lower     PAST SURGICAL HISTORY:   Past Surgical History:  Procedure Laterality Date  . BREAST BIOPSY Left    neg  . BREAST BIOPSY Right 05/04/13   [place] bx/clip-neg  . COLONOSCOPY    . COLONOSCOPY WITH PROPOFOL N/A 06/10/2018   Procedure: COLONOSCOPY WITH PROPOFOL;  Surgeon: Toledo, [name], MD;  Location: [facility];  Service: Gastroenterology;  Laterality: N/A;  . ESOPHAGOGASTRODUODENOSCOPY N/A 06/10/2018   Procedure: ESOPHAGOGASTRODUODENOSCOPY (EGD);  Surgeon: Toledo, [name], MD;  Location: [facility];  Service: Gastroenterology;  Laterality: N/A;  . HALLUX VALGUS AKIN Right  11/20/2016   Procedure: HALLUX VALGUS AKIN;  Surgeon: Samara Deist, DPM;  Location: [place];  Service: Podiatry;  Laterality: Right;  . HALLUX VALGUS AUSTIN Right 11/20/2016   Procedure: HALLUX VALGUS AUSTIN  Alroy Dust) right;  Surgeon: Samara Deist, DPM;  Location: [facility] [facility];  Service: Podiatry;  Laterality: Right;  IVA with Popliteal  . HALLUX VALGUS AUSTIN Left 01/15/2017   Procedure: HALLUX VALGUS AUSTIN  Correction left foot  Iva Popiteal;  Surgeon: Samara Deist, DPM;  Location: [place];  Service: Podiatry;  Laterality: Left;  IVA Popliteal   . LAPAROSCOPIC RIGHT COLECTOMY N/A 07/06/2018   Procedure: LAPAROSCOPIC COLECTOMY;  Surgeon: Benjamine Sprague, DO;  Location: [facility];  Service: General;  Laterality: N/A;  . MEDIASTINAL MASS EXCISION Left 11/09/2014   Dr. Wynelle Cleveland, [place]  . PORTACATH PLACEMENT Right 07/30/2018   Procedure: INSERTION PORT-A-CATH;  Surgeon: Benjamine Sprague, DO;  Location: [facility];  Service: General;  Laterality: Right;  . THORACOSCOPY Left 11/09/2014   with excision mediastinal mass  . TUBAL LIGATION       SOCIAL HISTORY:   Social History   Tobacco Use  . Smoking status: Former Smoker    Last attempt to quit: 10/18/2014    Years since quitting: 4.1  . Smokeless tobacco: Never Used  Substance Use Topics  . Alcohol use: No     FAMILY HISTORY:   Family  History  Problem Relation Age of Onset  . Cancer Father   . Breast cancer Neg Hx      DRUG ALLERGIES:   Allergies  Allergen Reactions  . Biaxin [Clarithromycin] Shortness Of Breath  . Levofloxacin     Muscle pain  . Penicillins Rash    Has patient had a PCN reaction causing immediate rash, facial/tongue/throat swelling, SOB or lightheadedness with hypotension: no Has patient had a PCN reaction causing severe rash involving mucus membranes or skin necrosis: no Has patient had a PCN reaction that required hospitalization: no Has patient had a PCN reaction occurring  within the last 10 years: no If all of the above answers are "NO", then may proceed with Cephalosporin use.     MEDICATIONS AT HOME:   Prior to Admission medications   Medication Sig Start Date End Date Taking? Authorizing Provider  ASPIRIN 81 PO Take 81 mg by mouth daily.    Yes [provider]  Cholecalciferol (VITAMIN D3) 2000 units TABS Take 2,000 Units by mouth daily.    Yes [provider]  conjugated estrogens (PREMARIN) vaginal cream Place 1 Applicatorful vaginally 2 (two) times a week.    Yes [provider]  lidocaine-prilocaine (EMLA) cream APPLY EXTERNALLY TO THE AFFECTED AREA 1 TIME 09/23/18  Yes Sindy Guadeloupe, MD  magic mouthwash w/lidocaine SOLN Take 10 mLs by mouth 3 (three) times daily as needed. Patient taking differently: Take 10 mLs by mouth 2 (two) times daily.  11/25/18  Yes Karen Kitchens, NP  polyethylene glycol (MIRALAX / GLYCOLAX) packet Take 17 g by mouth daily as needed.    Yes [provider]  Potassium Gluconate 550 MG TABS Take 1 tablet by mouth daily.   Yes [provider]  predniSONE (DELTASONE) 5 MG tablet Taper pack 12/07/18  Yes [provider]  PROCYCLIDINE HCL PO Take 5 mg by mouth 3 (three) times daily. Vernell Leep)    Yes [provider]  senna (SENOKOT) 8.6 MG tablet Take 1 tablet by mouth daily as needed.    Yes [provider]  thiothixene (NAVANE) 5 MG capsule Take 10 mg by mouth at bedtime.    Yes [provider]  vitamin B-12 (CYANOCOBALAMIN) 1000 MCG tablet Take 1,000 mcg by mouth daily.    Yes [provider]    REVIEW OF SYSTEMS:  Review of Systems  Constitutional: Negative for chills, fever, malaise/fatigue and weight loss.  HENT: Negative for ear pain, hearing loss and tinnitus.   Eyes: Negative for blurred vision, double vision, pain and redness.  Respiratory: Negative for cough, hemoptysis and shortness of breath.   Cardiovascular: Negative for chest  pain, palpitations, orthopnea and leg swelling.  Gastrointestinal: Negative for abdominal pain, constipation, diarrhea, nausea and vomiting.  Genitourinary: Positive for dysuria. Negative for frequency and hematuria.  Musculoskeletal: Positive for back pain. Negative for joint pain and neck pain.  Skin:       No acne, rash, or lesions  Neurological: Negative for dizziness, tremors, focal weakness and weakness.  Endo/Heme/Allergies: Negative for polydipsia. Does not bruise/bleed easily.  Psychiatric/Behavioral: Negative for depression. The patient is not nervous/anxious and does not have insomnia.      VITAL SIGNS:   Vitals:   12/14/18 1258 12/14/18 1259 12/14/18 1630 12/14/18 2127  BP: (!) 143/77  (!) 148/88 138/68  Pulse: 91  82 61  Resp:   18 17  Temp: 97.9 F (36.6 C)     TempSrc: Oral  SpO2: 96%  96% 96%  Weight:  68 kg    Height:  5\' 4"  (1.626 m)     Wt Readings from Last 3 Encounters:  12/14/18 68 kg  11/23/18 69.7 kg  11/09/18 68.8 kg    PHYSICAL EXAMINATION:  Physical Exam  Vitals reviewed. Constitutional: She is oriented to person, place, and time. She appears well-developed and well-nourished. No distress.  HENT:  Head: Normocephalic and atraumatic.  Mouth/Throat: Oropharynx is clear and moist.  Eyes: Pupils are equal, round, and reactive to light. Conjunctivae and EOM are normal. No scleral icterus.  Neck: Normal range of motion. Neck supple. No JVD present. No thyromegaly present.  Cardiovascular: Normal rate, regular rhythm and intact distal pulses. Exam reveals no gallop and no friction rub.  No murmur heard. Respiratory: Effort normal and breath sounds normal. No respiratory distress. She has no wheezes. She has no rales.  GI: Soft. Bowel sounds are normal. She exhibits no distension. There is no abdominal tenderness.  Musculoskeletal: Normal range of motion.        General: Tenderness (low back) present. No edema.     Comments: No arthritis, no gout   Lymphadenopathy:    She has no cervical adenopathy.  Neurological: She is alert and oriented to person, place, and time. No cranial nerve deficit.  No dysarthria, no aphasia  Skin: Skin is warm and dry. No rash noted. No erythema.  Psychiatric: She has a normal mood and affect. Her behavior is normal. Judgment and thought content normal.    LABORATORY PANEL:   CBC Recent Labs  Lab 12/14/18 1646  WBC 11.7*  HGB 12.4  HCT 38.9  PLT 301   ------------------------------------------------------------------------------------------------------------------  Chemistries  Recent Labs  Lab 12/14/18 1018 12/14/18 1646  NA 140 138  K 3.8 4.0  CL 103 104  CO2 25 25  GLUCOSE 120* 133*  BUN 21 23  CREATININE 0.65 0.51  CALCIUM 9.3 9.3  MG 2.2  --   AST 26 24  ALT 20 22  ALKPHOS 189* 172*  BILITOT 0.3 0.6   ------------------------------------------------------------------------------------------------------------------  Cardiac Enzymes No results for input(s): TROPONINI in the last 168 hours. ------------------------------------------------------------------------------------------------------------------  RADIOLOGY:  Dg Lumbar Spine Complete  Result Date: 12/14/2018 CLINICAL DATA:  Lower back pain. EXAM: LUMBAR SPINE - COMPLETE 4+ VIEW COMPARISON:  CT scan of June 17, 2018. FINDINGS: Old L2 compression fracture is noted. Moderate compression deformities of the L3 and L4 vertebral bodies are also noted which are new since prior exam. Moderate degenerative disc disease is noted at L5-S1. Degenerative changes are seen involving posterior facet joints of L4-5 and L5-S1. Atherosclerosis of abdominal aorta is noted. IMPRESSION: Stable old L2 compression fracture is noted. New L3 and L4 compression fractures are noted that appear to be at least subacute or chronic, but acute fracture can not be excluded. MRI may be performed for further evaluation. Electronically Signed   By: Marijo Conception, M.D.   On: 12/14/2018 14:55   Ct Renal Stone Study  Result Date: 12/14/2018 CLINICAL DATA:  Initial evaluation for acute lower back pain. EXAM: CT ABDOMEN AND PELVIS WITHOUT CONTRAST TECHNIQUE: Multidetector CT imaging of the abdomen and pelvis was performed following the standard protocol without IV contrast. COMPARISON:  Prior radiograph from earlier same day as well as prior CT from 06/17/2018 FINDINGS: Lower chest: Scattered subsegmental atelectatic changes present within the visualized lung bases. Partially visualized lungs are otherwise clear. Hepatobiliary: Subcentimeter hypodensity within the left hepatic lobe, too small  the characterize. Noncontrast evaluation liver otherwise unremarkable. Cholelithiasis with no imaging findings to suggest acute cholecystitis. No biliary dilatation. Pancreas: Pancreas within normal limits. Spleen: Spleen within normal limits. Adrenals/Urinary Tract: Adrenal glands are normal. Kidneys equal in size without nephrolithiasis or obstructive uropathy. No radiopaque calculi seen along the course of either renal collecting system. Bladder moderately distended without acute finding. Stomach/Bowel: Stomach within normal limits. No evidence for bowel obstruction. No acute inflammatory changes seen about the bowels. Prior partial colectomy with anastomotic suture at the hepatic flexure. Vascular/Lymphatic: Moderate aorto bi-iliac atherosclerotic disease. No aneurysm. No adenopathy. Reproductive: Uterus and ovaries within normal limits. Pessary device in place. Other: No free air or fluid. Musculoskeletal: Chronic L2 compression fracture, stable. Additional compression deformities involving the superior endplates of L3 and L4 are acute to subacute in appearance. Associated height loss of up to 35% at L3 with 4 mm bony retropulsion. Height loss measures up to 50% at L4 with 6 mm bony retropulsion. Additional acute nondisplaced fracture involves the left sacral ala. Probable  additional subtle acute nondisplaced fracture through the right sacral ala as well. Acute nondisplaced fracture through the right L5 transverse process. No discrete lytic or blastic osseous lesions. Additional nondisplaced fracture extends through the right pubis symphysis. Prominent osteoarthritic changes about the hips. IMPRESSION: 1. Acute to subacute fractures involving the L3 and L4 vertebral bodies with up to 40-50% height loss and 4-6 mm bony retropulsion as above. 2. Additional acute nondisplaced fractures of the right transverse process of L5, bilateral sacral ala, and right pubis symphysis. 3. No other acute intra-abdominal or pelvic process. 4. Cholelithiasis. Electronically Signed   By: Jeannine Boga M.D.   On: 12/14/2018 21:10    EKG:   Orders placed or performed during the hospital encounter of 06/19/18  . EKG 12-Lead  . EKG 12-Lead    IMPRESSION AND PLAN:  Principal Problem:   Acute lower UTI -IV antibiotics given, urine culture sent Active Problems:   Compression fracture of lumbosacral spine (HCC) -multiple fractures, orthopedic surgery consult   COPD (chronic obstructive pulmonary disease) ([place]) -continue home dose inhalers   Schizophrenia ([place]) -continue home dose antipsychotic  Chart review performed and case discussed with ED provider. Labs, imaging and/or ECG reviewed by provider and discussed with patient/family. Management plans discussed with the patient and/or family.  DVT PROPHYLAXIS: SubQ lovenox   GI PROPHYLAXIS:  None  ADMISSION STATUS: Observation  CODE STATUS: Full Code Status History    Date Active Date Inactive Code Status Order ID Comments User Context   07/06/2018 1809 07/09/2018 1259 Full Code [id number]  Benjamine Sprague, DO Inpatient   01/15/2017 1318 01/15/2017 1650 Full Code [id number]  Samara Deist, [facility] Inpatient   11/20/2016 1554 11/20/2016 1930 Full Code [id number]  Samara Deist, DPM Inpatient    Advance Directive Documentation     Most  Recent Value  Type of Advance Directive  Healthcare Power of [place], Living will  Pre-existing out of facility DNR order (yellow form or pink MOST form)  -  "MOST" Form in Place?  -      TOTAL TIME TAKING CARE OF THIS PATIENT: 40 minutes.   Ethlyn Daniels 12/14/2018, 11:36 PM  [facility]  Office  [phone]  CC: Primary care physician; Clarisse Gouge, MD  Note:  This document was prepared using Dragon voice recognition software and may include unintentional dictation errors.

## 2018-12-14 NOTE — Progress Notes (Signed)
Patient c/o increase pain noted to lower back ( pain level 10)

## 2018-12-14 NOTE — ED Notes (Signed)
Is scheduled for an MRI this Thursday, however, unable to wait that long.

## 2018-12-15 ENCOUNTER — Ambulatory Visit: Payer: Medicare Other | Admitting: Hematology and Oncology

## 2018-12-15 ENCOUNTER — Ambulatory Visit: Payer: Medicare Other

## 2018-12-15 ENCOUNTER — Observation Stay: Payer: Medicare Other

## 2018-12-15 ENCOUNTER — Other Ambulatory Visit: Payer: Medicare Other

## 2018-12-15 ENCOUNTER — Other Ambulatory Visit: Payer: Self-pay

## 2018-12-15 LAB — CBC
HCT: 36.5 % (ref 36.0–46.0)
HEMOGLOBIN: 11.6 g/dL — AB (ref 12.0–15.0)
MCH: 32.6 pg (ref 26.0–34.0)
MCHC: 31.8 g/dL (ref 30.0–36.0)
MCV: 102.5 fL — ABNORMAL HIGH (ref 80.0–100.0)
Platelets: 283 10*3/uL (ref 150–400)
RBC: 3.56 MIL/uL — ABNORMAL LOW (ref 3.87–5.11)
RDW: 13.2 % (ref 11.5–15.5)
WBC: 11.4 10*3/uL — AB (ref 4.0–10.5)
nRBC: 0 % (ref 0.0–0.2)

## 2018-12-15 LAB — BASIC METABOLIC PANEL
ANION GAP: 5 (ref 5–15)
BUN: 17 mg/dL (ref 8–23)
CO2: 26 mmol/L (ref 22–32)
Calcium: 9 mg/dL (ref 8.9–10.3)
Chloride: 111 mmol/L (ref 98–111)
Creatinine, Ser: 0.5 mg/dL (ref 0.44–1.00)
GFR calc Af Amer: >60 mL/min (ref >60)
GFR calc non Af Amer: >60 mL/min (ref >60)
Glucose, Bld: 91 mg/dL (ref 70–99)
POTASSIUM: 3.4 mmol/L — AB (ref 3.5–5.1)
Sodium: 142 mmol/L (ref 135–145)

## 2018-12-15 LAB — MAGNESIUM: Magnesium: 2 mg/dL (ref 1.7–2.4)

## 2018-12-15 MED ORDER — ASPIRIN 81 MG PO CHEW
81.0000 mg | CHEWABLE_TABLET | Freq: Every day | ORAL | Status: DC
  Administered 2018-12-16 – 2018-12-19 (×4): 81 mg via ORAL
  Filled 2018-12-15 (×5): qty 1

## 2018-12-15 MED ORDER — ACETAMINOPHEN 325 MG PO TABS
650.0000 mg | ORAL_TABLET | Freq: Four times a day (QID) | ORAL | Status: DC | PRN
  Administered 2018-12-17: 650 mg via ORAL
  Filled 2018-12-15: qty 2

## 2018-12-15 MED ORDER — THIOTHIXENE 10 MG PO CAPS
10.0000 mg | ORAL_CAPSULE | Freq: Every day | ORAL | Status: DC
  Administered 2018-12-15 – 2018-12-18 (×4): 10 mg via ORAL
  Filled 2018-12-15 (×5): qty 1

## 2018-12-15 MED ORDER — OXYCODONE-ACETAMINOPHEN 5-325 MG PO TABS
1.0000 | ORAL_TABLET | ORAL | Status: DC | PRN
  Administered 2018-12-15 – 2018-12-16 (×4): 2 via ORAL
  Administered 2018-12-17 – 2018-12-18 (×3): 1 via ORAL
  Administered 2018-12-18 (×2): 2 via ORAL
  Administered 2018-12-18: 1 via ORAL
  Administered 2018-12-19: 2 via ORAL
  Filled 2018-12-15 (×2): qty 2
  Filled 2018-12-15: qty 1
  Filled 2018-12-15: qty 2
  Filled 2018-12-15: qty 1
  Filled 2018-12-15 (×3): qty 2
  Filled 2018-12-15: qty 1
  Filled 2018-12-15: qty 2

## 2018-12-15 MED ORDER — ENOXAPARIN SODIUM 40 MG/0.4ML ~~LOC~~ SOLN
40.0000 mg | SUBCUTANEOUS | Status: DC
  Administered 2018-12-15 – 2018-12-16 (×2): 40 mg via SUBCUTANEOUS
  Filled 2018-12-15 (×2): qty 0.4

## 2018-12-15 MED ORDER — ACETAMINOPHEN 650 MG RE SUPP: 650.0000 mg | Freq: Four times a day (QID) | RECTAL | Status: DC | PRN

## 2018-12-15 MED ORDER — MAGIC MOUTHWASH
5.0000 mL | Freq: Three times a day (TID) | ORAL | Status: DC | PRN
  Administered 2018-12-15 – 2018-12-19 (×8): 5 mL via ORAL
  Filled 2018-12-15 (×8): qty 10

## 2018-12-15 MED ORDER — SODIUM CHLORIDE 0.9 % IV SOLN
INTRAVENOUS | Status: DC | PRN
  Administered 2018-12-15: 20 mL via INTRAVENOUS

## 2018-12-15 MED ORDER — SODIUM CHLORIDE 0.9 % IV SOLN
1.0000 g | INTRAVENOUS | Status: DC
  Administered 2018-12-15: 1 g via INTRAVENOUS
  Filled 2018-12-15: qty 10
  Filled 2018-12-15: qty 1

## 2018-12-15 MED ORDER — OXYCODONE HCL 5 MG PO TABS
5.0000 mg | ORAL_TABLET | ORAL | Status: DC | PRN
  Administered 2018-12-15: 5 mg via ORAL
  Filled 2018-12-15: qty 1

## 2018-12-15 MED ORDER — ONDANSETRON HCL 4 MG/2ML IJ SOLN: 4.0000 mg | Freq: Four times a day (QID) | INTRAMUSCULAR | Status: DC | PRN

## 2018-12-15 MED ORDER — POTASSIUM CHLORIDE CRYS ER 20 MEQ PO TBCR
40.0000 meq | EXTENDED_RELEASE_TABLET | Freq: Once | ORAL | Status: AC
  Administered 2018-12-15: 10:00:00 40 meq via ORAL
  Filled 2018-12-15: qty 2

## 2018-12-15 MED ORDER — ONDANSETRON HCL 4 MG PO TABS: 4.0000 mg | ORAL_TABLET | Freq: Four times a day (QID) | ORAL | Status: DC | PRN

## 2018-12-15 NOTE — Care Management Note (Signed)
Case Management Note  Patient Details  Name: Kathy [name] MRN: [id number] Date of Birth: 02-Feb-1946  Subjective/Objective:              Patient placed in observation after being seen at the [facility] cancer center for chemo follow up for stage 3 colon cancer..  Patient has been experiencing back pain   for the past couple of weeks. She denies any falls. MRI show several compression fractures. She is receiving IV antibiotics and has received IV Fentanyl IV and oral oxycodone for pain. Ortho consult pending.  patient's son [name] lives with her.   Action/Plan:  Requested PT/OT consults when orth clears  Expected Discharge Date:  12/19/18               Expected Discharge Plan:     In-House Referral:     Discharge planning Services     Post Acute Care Choice:    Choice offered to:     DME Arranged:    DME Agency:     HH Arranged:    HH Agency:     Status of Service:     If discussed at [employer], dates discussed:    Additional Comments:  Katrina Stack, RN 12/15/2018, 9:02 AM

## 2018-12-15 NOTE — Clinical Social Work Note (Signed)
Clinical Social Work Assessment  Patient Details  Name: Kathy [name] MRN: [id number] Date of Birth: Mar 07, 1946  Date of referral:  12/15/18               Reason for consult:  Facility Placement                Permission sought to share information with:  Case Manager, Customer service manager, Family Supports Permission granted to share information::  Yes, Verbal Permission Granted  Name::      SNF  Agency::   [facility]   Relationship::     Contact Information:     Housing/Transportation Living arrangements for the past 2 months:  [place] of Information:  Patient Patient Interpreter Needed:  None Criminal Activity/Legal Involvement Pertinent to Current Situation/Hospitalization:  No - Comment as needed Significant Relationships:  Adult Children Lives with:  Adult Children Do you feel safe going back to the place where you live?  Yes Need for family participation in patient care:  Yes (Comment)  Care giving concerns:  Patient lives with her son.    Social Worker assessment / plan:  CSW consulted for facility placement. CSW met with patient to discuss discharge planning. Patient reports that she lives with her son but he works during the day. Patient reports that she needs help and can not return home until he is stronger. CSW explained PT recommendation of SNF. Patient is in agreement. CSW will begin bed search and give offers once received. CSW will follow for discharge planning.   Employment status:  Retired Nurse, adult PT Recommendations:  [place] / Referral to community resources:  [place]  Patient/Family's Response to care:  Patient thanked CSW for assistance   Patient/Family's Understanding of and Emotional Response to Diagnosis, Current Treatment, and Prognosis:  Patient in agreement with discharge plan   Emotional Assessment Appearance:  Appears stated  age Attitude/Demeanor/Rapport:    Affect (typically observed):  Restless, Overwhelmed Orientation:  Oriented to Self, Oriented to Place, Oriented to  Time Alcohol / Substance use:  Not Applicable Psych involvement (Current and /or in the community):  No (Comment)  Discharge Needs  Concerns to be addressed:  Discharge Planning Concerns Readmission within the last 30 days:  No Current discharge risk:  Dependent with Mobility Barriers to Discharge:  Continued Medical Work up   [employer], [place] 12/15/2018, 5:37 PM

## 2018-12-15 NOTE — Evaluation (Signed)
Physical Therapy Evaluation Patient Details Name: Kathy [name] MRN: [id number] DOB: [birth year] Today's Date: 12/15/2018   History of Present Illness  Pt is a 73 year old female admitted with UTI and L3/4 endplate fracture.  PMH includes COPD, colon CA (last chemo 11/23/2018) and osteoporosis.  Clinical Impression  Pt is a 73 year old female who lives in a multi-story home with her son.  Pt has been a limited community ambulator with use of RW or SPC at baseline.  Pt in bed and reporting 0/10 pain at rest.  She required min A for bed mobility and was able to sit at EOB with report of increased pain.  Pt presented with overall weakness with most movement being limited by pain.  She attempted a STS and reported increase to 8/10 pain, stating that she could not transfer at this time.  Pt presented with visible tremors of UE.  PT assisted pt in returning to bed and pt was unable to perform bridge for bed mobility with report of pain limitation.  PT educated pt concerning log roll technique and importance of frequent movement.  Pt will continue to benefit from skilled PT with focus on strength, pain management and safe functional mobility.    Follow Up Recommendations SNF    Equipment Recommendations  None recommended by PT    Recommendations for Other Services       Precautions / Restrictions Precautions Precautions: Fall Restrictions Weight Bearing Restrictions: No      Mobility  Bed Mobility Overal bed mobility: Needs Assistance Bed Mobility: Sit to Supine;Supine to Sit     Supine to sit: Min assist Sit to supine: Min assist   General bed mobility comments: Assistance to bring LE's over EOB and to reposition in bed once she returned.  Pt reported extreme discomfort with bridging.  Transfers Overall transfer level: Needs assistance   Transfers: Sit to/from Stand           General transfer comment: Pt attempted to stand and reported pain increase to 8/10.  Stated that she does  not feel she can stand at this time.  Ambulation/Gait                Stairs            Wheelchair Mobility    Modified Rankin (Stroke Patients Only)       Balance Overall balance assessment: Needs assistance Sitting-balance support: Feet supported Sitting balance-Leahy Scale: Good                                       Pertinent Vitals/Pain Pain Assessment: 0-10 Pain Score: 8  Pain Location: when sitting; 0/10 at rest Pain Intervention(s): Limited activity within patient's tolerance;Monitored during session    [place] expects to be discharged to:: Private residence Living Arrangements: Children Available Help at Discharge: Family;Available PRN/intermittently Type of Home: House Home Access: Stairs to enter Entrance Stairs-Rails: None(Son helps her) Entrance Stairs-Number of Steps: 6 in front, 4 in garage Home Layout: Two level;Able to live on main level with bedroom/bathroom Home Equipment: Gilford Rile - 2 wheels;Cane - single point      Prior Function Level of Independence: Independent with assistive device(s)               Hand Dominance        Extremity/Trunk Assessment   Upper Extremity Assessment Upper Extremity Assessment: Generalized weakness  Lower Extremity Assessment Lower Extremity Assessment: Generalized weakness(Limited by pain.)    Cervical / Trunk Assessment Cervical / Trunk Assessment: Kyphotic  Communication   Communication: No difficulties  Cognition Arousal/Alertness: Lethargic Behavior During Therapy: WFL for tasks assessed/performed Overall Cognitive Status: No family/caregiver present to determine baseline cognitive functioning                                 General Comments: Pt presented with delayed response, followed instructions consistently.      General Comments      Exercises Other Exercises Other Exercises: Education for log rolling technique and  benefits x4 min   Assessment/Plan    PT Assessment Patient needs continued PT services  PT Problem List Decreased strength;Decreased mobility;Decreased activity tolerance;Decreased balance;Decreased knowledge of use of DME;Pain       PT Treatment Interventions DME instruction;Therapeutic activities;Gait training;Therapeutic exercise;Patient/family education;Stair training;Balance training;Functional mobility training    PT Goals (Current goals can be found in the Care Plan section)  Acute Rehab PT Goals Patient Stated Goal: To get pain under control PT Goal Formulation: With patient Time For Goal Achievement: 12/29/18 Potential to Achieve Goals: Good    Frequency Min 2X/week   Barriers to discharge        Co-evaluation               AM-PAC PT "6 Clicks" Mobility  Outcome Measure Help needed turning from your back to your side while in a flat bed without using bedrails?: A Little Help needed moving from lying on your back to sitting on the side of a flat bed without using bedrails?: A Little Help needed moving to and from a bed to a chair (including a wheelchair)?: A Lot Help needed standing up from a chair using your arms (e.g., wheelchair or bedside chair)?: A Lot Help needed to walk in hospital room?: A Lot Help needed climbing 3-5 steps with a railing? : A Lot 6 Click Score: 14    End of Session   Activity Tolerance: Patient limited by pain Patient left: in bed;with bed alarm set;with call bell/phone within reach Nurse Communication: Mobility status PT Visit Diagnosis: Unsteadiness on feet (R26.81);Muscle weakness (generalized) (M62.81)    Time: 3338-3291 PT Time Calculation (min) (ACUTE ONLY): 20 min   Charges:   PT Evaluation $PT Eval Low Complexity: 1 Low PT Treatments $Therapeutic Activity: 8-22 mins        Roxanne Gates, PT, DPT   Roxanne Gates 12/15/2018, 11:10 AM

## 2018-12-15 NOTE — Care Management Obs Status (Signed)
[facility] NOTIFICATION   Patient Details  Name: Kathy [name] MRN: [id number] Date of Birth: September 27, 1946   Medicare Observation Status Notification Given:  Yes    Katrina Stack, RN 12/15/2018, 9:02 AM

## 2018-12-15 NOTE — Progress Notes (Signed)
[facility] NAME: Kathy [name]    MR#:  [id number]  DATE OF BIRTH:  [birth year]  SUBJECTIVE:  CHIEF COMPLAINT:   Chief Complaint  Patient presents with  . Back Pain   Patient still complains of back pain, she also complains of urinary urgency and frequency but no fever or chills no dysuria. REVIEW OF SYSTEMS:  Review of Systems  Constitutional: Positive for malaise/fatigue. Negative for chills and fever.  HENT: Negative for sore throat.   Eyes: Negative for blurred vision and double vision.  Respiratory: Negative for cough, hemoptysis, shortness of breath, wheezing and stridor.   Cardiovascular: Negative for chest pain, palpitations, orthopnea and leg swelling.  Gastrointestinal: Negative for abdominal pain, blood in stool, diarrhea, melena, nausea and vomiting.  Genitourinary: Positive for frequency and urgency. Negative for dysuria, flank pain and hematuria.  Musculoskeletal: Positive for back pain. Negative for joint pain.  Skin: Negative for rash.  Neurological: Negative for dizziness, sensory change, focal weakness, seizures, loss of consciousness, weakness and headaches.  Endo/Heme/Allergies: Negative for polydipsia.  Psychiatric/Behavioral: Negative for depression. The patient is not nervous/anxious.     DRUG ALLERGIES:   Allergies  Allergen Reactions  . Biaxin [Clarithromycin] Shortness Of Breath  . Levofloxacin     Muscle pain  . Penicillins Rash    Has patient had a PCN reaction causing immediate rash, facial/tongue/throat swelling, SOB or lightheadedness with hypotension: no Has patient had a PCN reaction causing severe rash involving mucus membranes or skin necrosis: no Has patient had a PCN reaction that required hospitalization: no Has patient had a PCN reaction occurring within the last 10 years: no If all of the above answers are "NO", then may proceed with Cephalosporin use.    VITALS:  Blood pressure  (!) 133/50, pulse 88, temperature 98 F (36.7 C), temperature source Oral, resp. rate 18, height 5\' 4"  (1.626 m), weight 70 kg, SpO2 96 %. PHYSICAL EXAMINATION:  Physical Exam Vitals signs reviewed.  Constitutional:      General: She is not in acute distress.    Appearance: Normal appearance.  HENT:     Head: Normocephalic.     Mouth/Throat:     Mouth: Mucous membranes are moist.  Eyes:     General: No scleral icterus.    Conjunctiva/sclera: Conjunctivae normal.     Pupils: Pupils are equal, round, and reactive to light.  Neck:     Musculoskeletal: Normal range of motion and neck supple.     Vascular: No JVD.     Trachea: No tracheal deviation.  Cardiovascular:     Rate and Rhythm: Normal rate and regular rhythm.     Heart sounds: Normal heart sounds. No murmur. No gallop.   Pulmonary:     Effort: Pulmonary effort is normal. No respiratory distress.     Breath sounds: Normal breath sounds. No wheezing or rales.  Abdominal:     General: Bowel sounds are normal. There is no distension.     Palpations: Abdomen is soft.     Tenderness: There is no abdominal tenderness. There is no rebound.  Musculoskeletal: Normal range of motion.        General: No tenderness.     Right lower leg: No edema.     Left lower leg: No edema.  Skin:    Findings: No erythema or rash.  Neurological:     General: No focal deficit present.     Mental Status: She is  alert and oriented to person, place, and time.     Cranial Nerves: No cranial nerve deficit.  Psychiatric:        Mood and Affect: Mood normal.    LABORATORY PANEL:  Female CBC Recent Labs  Lab 12/15/18 0504  WBC 11.4*  HGB 11.6*  HCT 36.5  PLT 283   ------------------------------------------------------------------------------------------------------------------ Chemistries  Recent Labs  Lab 12/14/18 1646 12/15/18 0504  NA 138 142  K 4.0 3.4*  CL 104 111  CO2 25 26  GLUCOSE 133* 91  BUN 23 17  CREATININE 0.51 0.50    CALCIUM 9.3 9.0  MG  --  2.0  AST 24  --   ALT 22  --   ALKPHOS 172*  --   BILITOT 0.6  --    RADIOLOGY:  Dg Lumbar Spine Complete  Result Date: 12/14/2018 CLINICAL DATA:  Lower back pain. EXAM: LUMBAR SPINE - COMPLETE 4+ VIEW COMPARISON:  CT scan of June 17, 2018. FINDINGS: Old L2 compression fracture is noted. Moderate compression deformities of the L3 and L4 vertebral bodies are also noted which are new since prior exam. Moderate degenerative disc disease is noted at L5-S1. Degenerative changes are seen involving posterior facet joints of L4-5 and L5-S1. Atherosclerosis of abdominal aorta is noted. IMPRESSION: Stable old L2 compression fracture is noted. New L3 and L4 compression fractures are noted that appear to be at least subacute or chronic, but acute fracture can not be excluded. MRI may be performed for further evaluation. Electronically Signed   By: Marijo Conception, M.D.   On: 12/14/2018 14:55   Mr Lumbar Spine Wo Contrast  Result Date: 12/15/2018 CLINICAL DATA:  73 y/o F; compression fractures and bilateral sacral fractures. Recent history of colon cancer. EXAM: MRI LUMBAR SPINE WITHOUT CONTRAST TECHNIQUE: Multiplanar, multisequence MR imaging of the lumbar spine was performed. No intravenous contrast was administered. COMPARISON:  12/14/2018 CT abdomen and pelvis FINDINGS: Segmentation:  Standard. Alignment:  Stable L4-5 grade 1 anterolisthesis. Vertebrae: Chronic L2 compression fracture with 40% central loss of vertebral body height and 6 mm superior endplate retropulsion. L3 and L4 superior endplate fractures involving anterior and middle columns with 40% loss of vertebral body height and edema within the superior endplates indicating recent injury. 4 mm L3 and 6 mm L4 retropulsion of superior endplates. Bilateral L4-5 facet effusions and edema, likely degenerative. Bilateral sacral al a fractures with edema indicating recent injury. Conus medullaris and cauda equina: Conus extends  to the L1-2 level. Conus and cauda equina appear normal. Paraspinal and other soft tissues: Mild edema within the lumbar paraspinal muscles and the piriformis muscles compatible with muscle strain Disc levels: L1-2: Disc bulge and endplate retropulsion results in mild bilateral neural foraminal stenosis and mild spinal canal stenosis. L2-3: Disc bulge, endplate retropulsion, and facet hypertrophy results in mild bilateral foraminal stenosis and spinal canal stenosis. L3-4: Disc bulge, endplate retropulsion, and facet hypertrophy results in mild bilateral foraminal stenosis and spinal canal stenosis. L4-5: Grade 1 anterolisthesis with small uncovered disc bulge and bilateral facet hypertrophy results in mild bilateral foraminal stenosis. No significant spinal canal stenosis. L5-S1: Mild disc bulge and facet hypertrophy. No significant foraminal or spinal canal stenosis. IMPRESSION: 1. L3 and L4 superior endplate fractures involving anterior and middle columns with 40% loss of vertebral body height and edema indicating recent injury. 4 mm L3 and 6 mm L4 retropulsion of superior endplates. 2. Bilateral sacral fractures, better characterized on concurrent sacral MRI. 3. L4-5 facet edema and  effusions likely representing degenerative facet arthritis. 4. Lower lumbar paraspinal muscle edema, likely muscle strain. 5. Lumbar spondylosis and multilevel endplate retropulsion with multilevel mild foraminal stenosis and spinal canal stenosis. No high-grade spinal canal stenosis. Electronically Signed   By: Kristine Garbe M.D.   On: 12/15/2018 02:18   Mr Sacrum Si Joints Wo Contrast  Result Date: 12/15/2018 CLINICAL DATA:  Back pain. EXAM: MRI PELVIS WITHOUT CONTRAST TECHNIQUE: Multiplanar multisequence MR imaging of the pelvis was performed. No intravenous contrast was administered. COMPARISON:  CT scan 12/14/2018 FINDINGS: Urinary Tract: The bladder is moderately distended. No bladder mass or calculi. Bowel:  The  visualized small bowel and colon are unremarkable. Vascular/Lymphatic: No aneurysm or adenopathy. Reproductive:  Surgically absent. Other:  Pessary ring noted in the vagina. Musculoskeletal: Typical H-shaped sacral insufficiency fractures. No displacement. No worrisome bone lesions. IMPRESSION: 1. Typical H-shaped sacral insufficiency fractures as demonstrated on the CT scan from yesterday. 2. Moderate bladder distention. Electronically Signed   By: Marijo Sanes M.D.   On: 12/15/2018 07:53   Ct Renal Stone Study  Result Date: 12/14/2018 CLINICAL DATA:  Initial evaluation for acute lower back pain. EXAM: CT ABDOMEN AND PELVIS WITHOUT CONTRAST TECHNIQUE: Multidetector CT imaging of the abdomen and pelvis was performed following the standard protocol without IV contrast. COMPARISON:  Prior radiograph from earlier same day as well as prior CT from 06/17/2018 FINDINGS: Lower chest: Scattered subsegmental atelectatic changes present within the visualized lung bases. Partially visualized lungs are otherwise clear. Hepatobiliary: Subcentimeter hypodensity within the left hepatic lobe, too small the characterize. Noncontrast evaluation liver otherwise unremarkable. Cholelithiasis with no imaging findings to suggest acute cholecystitis. No biliary dilatation. Pancreas: Pancreas within normal limits. Spleen: Spleen within normal limits. Adrenals/Urinary Tract: Adrenal glands are normal. Kidneys equal in size without nephrolithiasis or obstructive uropathy. No radiopaque calculi seen along the course of either renal collecting system. Bladder moderately distended without acute finding. Stomach/Bowel: Stomach within normal limits. No evidence for bowel obstruction. No acute inflammatory changes seen about the bowels. Prior partial colectomy with anastomotic suture at the hepatic flexure. Vascular/Lymphatic: Moderate aorto bi-iliac atherosclerotic disease. No aneurysm. No adenopathy. Reproductive: Uterus and ovaries within  normal limits. Pessary device in place. Other: No free air or fluid. Musculoskeletal: Chronic L2 compression fracture, stable. Additional compression deformities involving the superior endplates of L3 and L4 are acute to subacute in appearance. Associated height loss of up to 35% at L3 with 4 mm bony retropulsion. Height loss measures up to 50% at L4 with 6 mm bony retropulsion. Additional acute nondisplaced fracture involves the left sacral ala. Probable additional subtle acute nondisplaced fracture through the right sacral ala as well. Acute nondisplaced fracture through the right L5 transverse process. No discrete lytic or blastic osseous lesions. Additional nondisplaced fracture extends through the right pubis symphysis. Prominent osteoarthritic changes about the hips. IMPRESSION: 1. Acute to subacute fractures involving the L3 and L4 vertebral bodies with up to 40-50% height loss and 4-6 mm bony retropulsion as above. 2. Additional acute nondisplaced fractures of the right transverse process of L5, bilateral sacral ala, and right pubis symphysis. 3. No other acute intra-abdominal or pelvic process. 4. Cholelithiasis. Electronically Signed   By: Jeannine Boga M.D.   On: 12/14/2018 21:10   ASSESSMENT AND PLAN:    Acute lower UTI - Continue Rocephin and follow-up urine culture.    Compression fracture of lumbosacral spine (HCC) -multiple fractures, pain control, follow-up orthopedic surgery consult  Hypokalemia.  Potassium supplement.   COPD (  chronic obstructive pulmonary disease) ([place]) -continue home dose inhalers   Schizophrenia ([place]) -continue home dose antipsychotic : Cancer diagnosed last August on chemotherapy. Oncology consult from Dr. Marianna Payment. PT: SNF. All the records are reviewed and case discussed with Care Management/Social Worker. Management plans discussed with the patient, her son  and they are in agreement.  CODE STATUS: Full Code  TOTAL TIME TAKING CARE OF THIS PATIENT:  33 minutes.   More than 50% of the time was spent in counseling/coordination of care: YES  POSSIBLE D/C IN 2 DAYS, DEPENDING ON CLINICAL CONDITION.   Demetrios Loll M.D on 12/15/2018 at 12:15 PM  Between 7am to 6pm - Pager - [phone]  After 6pm go to [email or web address] - [facility]

## 2018-12-15 NOTE — Consult Note (Signed)
ORTHOPAEDIC CONSULTATION  REQUESTING PHYSICIAN: Demetrios Loll, MD  Chief Complaint: Low back pain  HPI: Kathy [name] is a 73 y.o. female who complains of low back pain for 3 weeks which is become progressively worse.  Patient states that she has not had any recent fall or traumatic event.  Patient states that she has pain radiating from her low back into the right buttock.  Patient denies any bowel or bladder dysfunction.  She does have difficulty ambulating now which may indicate lower extremity weakness. She has tried taking NSAIDs and oral steroids at home without relief of her symptoms.  She is experiencing some relief with oral narcotics in the hospital.  Past Medical History:  Diagnosis Date  . Arthritis    hips  . B12 deficiency   . Colon cancer ([place]) 07/06/2018  . 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 COPD (chronic obstructive pulmonary disease) ([place])   . Fibrocystic breast disease   . Mediastinal mass    removed 1/16  . Motion sickness    fair rides  . Osteoporosis   . Schizophrenia ([place])   . Vitamin D deficiency   . Wears dentures    partial upper and lower   Past Surgical History:  Procedure Laterality Date  . BREAST BIOPSY Left    neg  . BREAST BIOPSY Right 05/04/13   [place] bx/clip-neg  . COLONOSCOPY    . COLONOSCOPY WITH PROPOFOL N/A 06/10/2018   Procedure: COLONOSCOPY WITH PROPOFOL;  Surgeon: Toledo, [name], MD;  Location: [facility];  Service: Gastroenterology;  Laterality: N/A;  . ESOPHAGOGASTRODUODENOSCOPY N/A 06/10/2018   Procedure: ESOPHAGOGASTRODUODENOSCOPY (EGD);  Surgeon: Toledo, [name], MD;  Location: [facility];  Service: Gastroenterology;  Laterality: N/A;  . HALLUX VALGUS AKIN Right 11/20/2016   Procedure: HALLUX VALGUS AKIN;  Surgeon: Samara Deist, DPM;  Location: [place];  Service: Podiatry;  Laterality: Right;  . HALLUX VALGUS AUSTIN Right 11/20/2016   Procedure: HALLUX VALGUS AUSTIN  Alroy Dust) right;  Surgeon: Samara Deist,  DPM;  Location: [place];  Service: Podiatry;  Laterality: Right;  IVA with Popliteal  . HALLUX VALGUS AUSTIN Left 01/15/2017   Procedure: HALLUX VALGUS AUSTIN  Correction left foot  Iva Popiteal;  Surgeon: Samara Deist, DPM;  Location: [place];  Service: Podiatry;  Laterality: Left;  IVA Popliteal   . LAPAROSCOPIC RIGHT COLECTOMY N/A 07/06/2018   Procedure: LAPAROSCOPIC COLECTOMY;  Surgeon: Benjamine Sprague, DO;  Location: [facility];  Service: General;  Laterality: N/A;  . MEDIASTINAL MASS EXCISION Left 11/09/2014   Dr. Wynelle Cleveland, [place]  . PORTACATH PLACEMENT Right 07/30/2018   Procedure: INSERTION PORT-A-CATH;  Surgeon: Benjamine Sprague, DO;  Location: [facility];  Service: General;  Laterality: Right;  . THORACOSCOPY Left 11/09/2014   with excision mediastinal mass  . TUBAL LIGATION     Social History   Socioeconomic History  . Marital status: Widowed    Spouse name: Not on file  . Number of children: Not on file  . Years of education: Not on file  . Highest education level: Not on file  Occupational History  . Not on file  Social Needs  . Financial resource strain: Not hard at all  . Food insecurity:    Worry: Patient refused    Inability: Patient refused  . Transportation needs:    Medical: Patient refused    Non-medical: Patient refused  Tobacco Use  . Smoking status: Former Smoker    Last attempt to quit: 10/18/2014  Years since quitting: 4.1  . Smokeless tobacco: Never Used  Substance and Sexual Activity  . Alcohol use: No  . Drug use: Never  . Sexual activity: Not Currently  Lifestyle  . Physical activity:    Days per week: Patient refused    Minutes per session: Patient refused  . Stress: Only a little  Relationships  . Social connections:    Talks on phone: Patient refused    Gets together: Patient refused    Attends religious service: Patient refused    Active member of club or organization: Patient refused    Attends meetings of clubs or  organizations: Patient refused    Relationship status: Patient refused  Other Topics Concern  . Not on file  Social History Narrative  . Not on file   Family History  Problem Relation Age of Onset  . Cancer Father   . Breast cancer Neg Hx    Allergies  Allergen Reactions  . Biaxin [Clarithromycin] Shortness Of Breath  . Levofloxacin     Muscle pain  . Penicillins Rash    Has patient had a PCN reaction causing immediate rash, facial/tongue/throat swelling, SOB or lightheadedness with hypotension: no Has patient had a PCN reaction causing severe rash involving mucus membranes or skin necrosis: no Has patient had a PCN reaction that required hospitalization: no Has patient had a PCN reaction occurring within the last 10 years: no If all of the above answers are "NO", then may proceed with Cephalosporin use.    Prior to Admission medications   Medication Sig Start Date End Date Taking? Authorizing Provider  ASPIRIN 81 PO Take 81 mg by mouth daily.    Yes [provider]  Cholecalciferol (VITAMIN D3) 2000 units TABS Take 2,000 Units by mouth daily.    Yes [provider]  conjugated estrogens (PREMARIN) vaginal cream Place 1 Applicatorful vaginally 2 (two) times a week.    Yes [provider]  lidocaine-prilocaine (EMLA) cream APPLY EXTERNALLY TO THE AFFECTED AREA 1 TIME 09/23/18  Yes Sindy Guadeloupe, MD  magic mouthwash w/lidocaine SOLN Take 10 mLs by mouth 3 (three) times daily as needed. Patient taking differently: Take 10 mLs by mouth 2 (two) times daily.  11/25/18  Yes Karen Kitchens, NP  polyethylene glycol (MIRALAX / GLYCOLAX) packet Take 17 g by mouth daily as needed.    Yes [provider]  Potassium Gluconate 550 MG TABS Take 1 tablet by mouth daily.   Yes [provider]  predniSONE (DELTASONE) 5 MG tablet Taper pack 12/07/18  Yes [provider]  PROCYCLIDINE HCL PO Take 5 mg by mouth 3 (three) times daily. Vernell Leep)    Yes  [provider]  senna (SENOKOT) 8.6 MG tablet Take 1 tablet by mouth daily as needed.    Yes [provider]  thiothixene (NAVANE) 5 MG capsule Take 10 mg by mouth at bedtime.    Yes [provider]  vitamin B-12 (CYANOCOBALAMIN) 1000 MCG tablet Take 1,000 mcg by mouth daily.    Yes [provider]   Dg Lumbar Spine Complete  Result Date: 12/14/2018 CLINICAL DATA:  Lower back pain. EXAM: LUMBAR SPINE - COMPLETE 4+ VIEW COMPARISON:  CT scan of June 17, 2018. FINDINGS: Old L2 compression fracture is noted. Moderate compression deformities of the L3 and L4 vertebral bodies are also noted which are new since prior exam. Moderate degenerative disc disease is noted at L5-S1. Degenerative changes are seen involving posterior facet joints of  L4-5 and L5-S1. Atherosclerosis of abdominal aorta is noted. IMPRESSION: Stable old L2 compression fracture is noted. New L3 and L4 compression fractures are noted that appear to be at least subacute or chronic, but acute fracture can not be excluded. MRI may be performed for further evaluation. Electronically Signed   By: Marijo Conception, M.D.   On: 12/14/2018 14:55   Mr Lumbar Spine Wo Contrast  Result Date: 12/15/2018 CLINICAL DATA:  73 y/o F; compression fractures and bilateral sacral fractures. Recent history of colon cancer. EXAM: MRI LUMBAR SPINE WITHOUT CONTRAST TECHNIQUE: Multiplanar, multisequence MR imaging of the lumbar spine was performed. No intravenous contrast was administered. COMPARISON:  12/14/2018 CT abdomen and pelvis FINDINGS: Segmentation:  Standard. Alignment:  Stable L4-5 grade 1 anterolisthesis. Vertebrae: Chronic L2 compression fracture with 40% central loss of vertebral body height and 6 mm superior endplate retropulsion. L3 and L4 superior endplate fractures involving anterior and middle columns with 40% loss of vertebral body height and edema within the superior endplates indicating recent injury. 4 mm L3  and 6 mm L4 retropulsion of superior endplates. Bilateral L4-5 facet effusions and edema, likely degenerative. Bilateral sacral al a fractures with edema indicating recent injury. Conus medullaris and cauda equina: Conus extends to the L1-2 level. Conus and cauda equina appear normal. Paraspinal and other soft tissues: Mild edema within the lumbar paraspinal muscles and the piriformis muscles compatible with muscle strain Disc levels: L1-2: Disc bulge and endplate retropulsion results in mild bilateral neural foraminal stenosis and mild spinal canal stenosis. L2-3: Disc bulge, endplate retropulsion, and facet hypertrophy results in mild bilateral foraminal stenosis and spinal canal stenosis. L3-4: Disc bulge, endplate retropulsion, and facet hypertrophy results in mild bilateral foraminal stenosis and spinal canal stenosis. L4-5: Grade 1 anterolisthesis with small uncovered disc bulge and bilateral facet hypertrophy results in mild bilateral foraminal stenosis. No significant spinal canal stenosis. L5-S1: Mild disc bulge and facet hypertrophy. No significant foraminal or spinal canal stenosis. IMPRESSION: 1. L3 and L4 superior endplate fractures involving anterior and middle columns with 40% loss of vertebral body height and edema indicating recent injury. 4 mm L3 and 6 mm L4 retropulsion of superior endplates. 2. Bilateral sacral fractures, better characterized on concurrent sacral MRI. 3. L4-5 facet edema and effusions likely representing degenerative facet arthritis. 4. Lower lumbar paraspinal muscle edema, likely muscle strain. 5. Lumbar spondylosis and multilevel endplate retropulsion with multilevel mild foraminal stenosis and spinal canal stenosis. No high-grade spinal canal stenosis. Electronically Signed   By: Kristine Garbe M.D.   On: 12/15/2018 02:18   Mr Sacrum Si Joints Wo Contrast  Result Date: 12/15/2018 CLINICAL DATA:  Back pain. EXAM: MRI PELVIS WITHOUT CONTRAST TECHNIQUE: Multiplanar  multisequence MR imaging of the pelvis was performed. No intravenous contrast was administered. COMPARISON:  CT scan 12/14/2018 FINDINGS: Urinary Tract: The bladder is moderately distended. No bladder mass or calculi. Bowel:  The visualized small bowel and colon are unremarkable. Vascular/Lymphatic: No aneurysm or adenopathy. Reproductive:  Surgically absent. Other:  Pessary ring noted in the vagina. Musculoskeletal: Typical H-shaped sacral insufficiency fractures. No displacement. No worrisome bone lesions. IMPRESSION: 1. Typical H-shaped sacral insufficiency fractures as demonstrated on the CT scan from yesterday. 2. Moderate bladder distention. Electronically Signed   By: Marijo Sanes M.D.   On: 12/15/2018 07:53   Ct Renal Stone Study  Result Date: 12/14/2018 CLINICAL DATA:  Initial evaluation for acute lower back pain. EXAM: CT ABDOMEN AND PELVIS WITHOUT CONTRAST TECHNIQUE: Multidetector CT imaging of the  abdomen and pelvis was performed following the standard protocol without IV contrast. COMPARISON:  Prior radiograph from earlier same day as well as prior CT from 06/17/2018 FINDINGS: Lower chest: Scattered subsegmental atelectatic changes present within the visualized lung bases. Partially visualized lungs are otherwise clear. Hepatobiliary: Subcentimeter hypodensity within the left hepatic lobe, too small the characterize. Noncontrast evaluation liver otherwise unremarkable. Cholelithiasis with no imaging findings to suggest acute cholecystitis. No biliary dilatation. Pancreas: Pancreas within normal limits. Spleen: Spleen within normal limits. Adrenals/Urinary Tract: Adrenal glands are normal. Kidneys equal in size without nephrolithiasis or obstructive uropathy. No radiopaque calculi seen along the course of either renal collecting system. Bladder moderately distended without acute finding. Stomach/Bowel: Stomach within normal limits. No evidence for bowel obstruction. No acute inflammatory changes  seen about the bowels. Prior partial colectomy with anastomotic suture at the hepatic flexure. Vascular/Lymphatic: Moderate aorto bi-iliac atherosclerotic disease. No aneurysm. No adenopathy. Reproductive: Uterus and ovaries within normal limits. Pessary device in place. Other: No free air or fluid. Musculoskeletal: Chronic L2 compression fracture, stable. Additional compression deformities involving the superior endplates of L3 and L4 are acute to subacute in appearance. Associated height loss of up to 35% at L3 with 4 mm bony retropulsion. Height loss measures up to 50% at L4 with 6 mm bony retropulsion. Additional acute nondisplaced fracture involves the left sacral ala. Probable additional subtle acute nondisplaced fracture through the right sacral ala as well. Acute nondisplaced fracture through the right L5 transverse process. No discrete lytic or blastic osseous lesions. Additional nondisplaced fracture extends through the right pubis symphysis. Prominent osteoarthritic changes about the hips. IMPRESSION: 1. Acute to subacute fractures involving the L3 and L4 vertebral bodies with up to 40-50% height loss and 4-6 mm bony retropulsion as above. 2. Additional acute nondisplaced fractures of the right transverse process of L5, bilateral sacral ala, and right pubis symphysis. 3. No other acute intra-abdominal or pelvic process. 4. Cholelithiasis. Electronically Signed   By: Jeannine Boga M.D.   On: 12/14/2018 21:10    Positive ROS: All other systems have been reviewed and were otherwise negative with the exception of those mentioned in the HPI and as above.  Physical Exam: General: Alert, no acute distress Neurologic: Sensation intact distally Psychiatric: Patient is competent for consent with normal mood and affect  MUSCULOSKELETAL: Patient has intact sensation in both lower extremities to light touch.  She can dorsiflex and plantarflex her ankles without detectable weakness.  She has palpable  pedal pulses.  Patient can flex and extend her knee actively and without detectable weakness.  Patient had no pain with internal and external rotation of either hip using a logrolling technique.  Assessment: Low back pain with right-sided lumbar radiculopathy  Plan: Patient has had 3 weeks of increasing low back pain with difficulty ambulating.  Patient had an MRI of the lumbar spine and sacrum.  The sacral MRI shows H shaped insufficiency fractures.  The patient has a chronic compression fracture at L2 with retropulsion.  Patient has new compression fractures at L3 and L4 with retropulsion.  Patient has diffuse lumbar spondylosis.  Patient has diffuse lumbar facet degenerative changes as well.  I recommend a neurosurgical consult given the patient's radicular symptoms extending into the right lower extremity and with a diffuse spondylosis and compression fracture seen.  Patient has failed conservative treatment with NSAIDs and oral steroids.  She is having difficulty ambulating which may indicate weakness.      Thornton Park, MD    12/15/2018  2:26 PM

## 2018-12-15 NOTE — NC FL2 (Signed)
[place] LEVEL OF CARE SCREENING TOOL     IDENTIFICATION  Patient Name: Kathy [name] Birthdate: 1946/07/16 Sex: female Admission Date (Current Location): 12/14/2018  [place] and [place] Number:  Engineering geologist and Address:  [facility], [address], [place], [place] [address]      Provider Number: [id number]  Attending Physician Name and Address:  Demetrios Loll, MD  Relative Name and Phone Number:       Current Level of Care: Hospital Recommended Level of Care: [place] Prior Approval Number:    Date Approved/Denied:   PASRR Number: Pending   Discharge Plan: SNF    Current Diagnoses: Patient Active Problem List   Diagnosis Date Noted  . Acute lower UTI 12/14/2018  . Compression fracture of lumbosacral spine ([place]) 12/14/2018  . COPD (chronic obstructive pulmonary disease) ([place]) 12/14/2018  . Schizophrenia ([place]) 12/14/2018  . UTI (urinary tract infection) 12/14/2018  . Anemia due to antineoplastic chemotherapy 11/08/2018  . Encounter for antineoplastic chemotherapy 10/31/2018  . Iron deficiency anemia 08/03/2018  . Goals of care, counseling/discussion 07/24/2018  . Colon cancer ([place]) 07/06/2018    Orientation RESPIRATION BLADDER Height & Weight     Self, Time, Place  Normal Continent Weight: 154 lb 4.8 oz (70 kg) Height:  5\' 4"  (162.6 cm)  BEHAVIORAL SYMPTOMS/MOOD NEUROLOGICAL BOWEL NUTRITION STATUS  (none) (None) Continent Diet(regular )  AMBULATORY STATUS COMMUNICATION OF NEEDS Skin   Extensive Assist Verbally Normal                       Personal Care Assistance Level of Assistance  Bathing, Feeding, Dressing Bathing Assistance: Limited assistance Feeding assistance: Independent Dressing Assistance: Limited assistance     Functional Limitations Info  Sight, Hearing, Speech Sight Info: Adequate Hearing Info: Adequate Speech Info: Adequate    SPECIAL CARE FACTORS FREQUENCY  PT (By  licensed PT), OT (By licensed OT)     PT Frequency: 5 OT Frequency: 5            Contractures Contractures Info: Not present    Additional Factors Info  Code Status, Allergies Code Status Info: FUll Code  Allergies Info:  Biaxin (Clarithromycin), Levofloxacin, Penicillins           Current Medications (12/15/2018):  This is the current hospital active medication list Current Facility-Administered Medications  Medication Dose Route Frequency Provider Last Rate Last Dose  . acetaminophen (TYLENOL) tablet 650 mg  650 mg Oral Q6H PRN Lance Coon, MD       Or  . acetaminophen (TYLENOL) suppository 650 mg  650 mg Rectal Q6H PRN Lance Coon, MD      . aspirin chewable tablet 81 mg  81 mg Oral Daily Lance Coon, MD      . cefTRIAXone (ROCEPHIN) 1 g in sodium chloride 0.9 % 100 mL IVPB  1 g Intravenous Q24H Lance Coon, MD      . enoxaparin (LOVENOX) injection 40 mg  40 mg Subcutaneous Q24H Lance Coon, MD   40 mg at 12/15/18 1023  . ondansetron (ZOFRAN) tablet 4 mg  4 mg Oral Q6H PRN Lance Coon, MD       Or  . ondansetron [facility]) injection 4 mg  4 mg Intravenous Q6H PRN Lance Coon, MD      . oxyCODONE (Oxy IR/ROXICODONE) immediate release tablet 5 mg  5 mg Oral Q4H PRN Lance Coon, MD   5 mg at 12/15/18 0105  .  thiothixene (NAVANE) capsule 10 mg  10 mg Oral QHS Lance Coon, MD       Facility-Administered Medications Ordered in Other Encounters  Medication Dose Route Frequency Provider Last Rate Last Dose  . heparin lock flush 100 unit/mL  500 Units Intravenous Once Sindy Guadeloupe, MD      . heparin lock flush 100 unit/mL  500 Units Intravenous Once Corcoran, Melissa C, MD      . heparin lock flush 100 unit/mL  500 Units Intravenous Once Corcoran, Melissa C, MD      . sodium chloride flush (NS) 0.9 % injection 10 mL  10 mL Intravenous PRN Sindy Guadeloupe, MD   10 mL at 10/05/18 0845  . sodium chloride flush (NS) 0.9 % injection 10 mL  10 mL Intravenous PRN  Nolon Stalls C, MD   10 mL at 11/09/18 0840  . sodium chloride flush (NS) 0.9 % injection 10 mL  10 mL Intravenous PRN Lequita Asal, MD   10 mL at 11/23/18 1173     Discharge Medications: Please see discharge summary for a list of discharge medications.  Relevant Imaging Results:  Relevant Lab Results:   Additional Information SSN: [id number]  Annamaria Boots, [place]

## 2018-12-15 NOTE — ED Notes (Signed)
Patient transported to MRI 

## 2018-12-16 LAB — URINE CULTURE

## 2018-12-16 MED ORDER — POTASSIUM CHLORIDE CRYS ER 20 MEQ PO TBCR
40.0000 meq | EXTENDED_RELEASE_TABLET | Freq: Once | ORAL | Status: AC
  Administered 2018-12-16: 40 meq via ORAL
  Filled 2018-12-16: qty 2

## 2018-12-16 MED ORDER — CEPHALEXIN 250 MG PO CAPS
250.0000 mg | ORAL_CAPSULE | Freq: Three times a day (TID) | ORAL | Status: DC
  Administered 2018-12-16 – 2018-12-19 (×8): 250 mg via ORAL
  Filled 2018-12-16 (×11): qty 1

## 2018-12-16 MED ORDER — CLINDAMYCIN PHOSPHATE 600 MG/50ML IV SOLN
600.0000 mg | Freq: Once | INTRAVENOUS | Status: AC
  Administered 2018-12-17: 600 mg via INTRAVENOUS
  Filled 2018-12-16: qty 50

## 2018-12-16 NOTE — Consult Note (Signed)
Referring Physician:  No referring provider defined for this encounter.  Primary Physician:  Clarisse Gouge, MD  Chief Complaint: Back pain and leg pain  History of Present Illness: Kathy [name] is a 73 y.o. female who presents as a consult for back pain and lower extremity pain.  X-ray and MRI revealed new L3 and L4 compression fractures as well as bilateral insufficiency fractures. According to patient, symptoms began approximately 3 weeks ago.  No inciting event to correlate with symptom presentation.  At this time, back pain is the most bothersome complaint.  Described as diffuse low lumbar pain that is significantly worsened with standing.  Also complains of intermittent, bilateral (currently right-sided), lateral and posterior thigh pain that also started 3 weeks ago.  States weakness in lower extremities that limits her ability to stand and walk.  She was unable to determine if lower extremity weakness was correlated to pain. Denies bladder/bowel dysfunction, saddle paresthesia, numbness/tingling in the lower extremities, foot drop, recent trauma/falls.  Review of Systems:  A 10 point review of systems is negative, except for the pertinent positives and negatives detailed in the HPI.  Past Medical History: Past Medical History:  Diagnosis Date  . Arthritis    hips  . B12 deficiency   . Colon cancer ([place]) 07/06/2018  . 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 COPD (chronic obstructive pulmonary disease) ([place])   . Fibrocystic breast disease   . Mediastinal mass    removed 1/16  . Motion sickness    fair rides  . Osteoporosis   . Schizophrenia ([place])   . Vitamin D deficiency   . Wears dentures    partial upper and lower    Past Surgical History: Past Surgical History:  Procedure Laterality Date  . BREAST BIOPSY Left    neg  . BREAST BIOPSY Right 05/04/13   [place] bx/clip-neg  . COLONOSCOPY    . COLONOSCOPY WITH PROPOFOL N/A 06/10/2018   Procedure: COLONOSCOPY WITH PROPOFOL;   Surgeon: Toledo, [name], MD;  Location: [facility];  Service: Gastroenterology;  Laterality: N/A;  . ESOPHAGOGASTRODUODENOSCOPY N/A 06/10/2018   Procedure: ESOPHAGOGASTRODUODENOSCOPY (EGD);  Surgeon: Toledo, [name], MD;  Location: [facility];  Service: Gastroenterology;  Laterality: N/A;  . HALLUX VALGUS AKIN Right 11/20/2016   Procedure: HALLUX VALGUS AKIN;  Surgeon: Samara Deist, DPM;  Location: [place];  Service: Podiatry;  Laterality: Right;  . HALLUX VALGUS AUSTIN Right 11/20/2016   Procedure: HALLUX VALGUS AUSTIN  Alroy Dust) right;  Surgeon: Samara Deist, DPM;  Location: [place];  Service: Podiatry;  Laterality: Right;  IVA with Popliteal  . HALLUX VALGUS AUSTIN Left 01/15/2017   Procedure: HALLUX VALGUS AUSTIN  Correction left foot  Iva Popiteal;  Surgeon: Samara Deist, DPM;  Location: [place];  Service: Podiatry;  Laterality: Left;  IVA Popliteal   . LAPAROSCOPIC RIGHT COLECTOMY N/A 07/06/2018   Procedure: LAPAROSCOPIC COLECTOMY;  Surgeon: Benjamine Sprague, DO;  Location: [facility];  Service: General;  Laterality: N/A;  . MEDIASTINAL MASS EXCISION Left 11/09/2014   Dr. Wynelle Cleveland, [place]  . PORTACATH PLACEMENT Right 07/30/2018   Procedure: INSERTION PORT-A-CATH;  Surgeon: Benjamine Sprague, DO;  Location: [facility];  Service: General;  Laterality: Right;  . THORACOSCOPY Left 11/09/2014   with excision mediastinal mass  . TUBAL LIGATION      Allergies: Allergies as of 12/14/2018 - Review Complete 12/14/2018  Allergen Reaction Noted  . Biaxin [clarithromycin] Shortness Of Breath 11/14/2016  . Levofloxacin  11/14/2016  .  Penicillins Rash 11/14/2016    Medications:  Current Facility-Administered Medications:  .  0.9 %  sodium chloride infusion, , Intravenous, PRN, Lance Coon, MD, Stopped at 12/15/18 2137 .  acetaminophen (TYLENOL) tablet 650 mg, 650 mg, Oral, Q6H PRN **OR** acetaminophen (TYLENOL) suppository 650 mg, 650 mg, Rectal, Q6H PRN,  Lance Coon, MD .  aspirin chewable tablet 81 mg, 81 mg, Oral, Daily, Lance Coon, MD, 81 mg at 12/16/18 7416 .  cephALEXin (KEFLEX) capsule 250 mg, 250 mg, Oral, Q8H, Mody, Sital, MD, 250 mg at 12/16/18 1240 .  enoxaparin (LOVENOX) injection 40 mg, 40 mg, Subcutaneous, Q24H, Lance Coon, MD, 40 mg at 12/16/18 0917 .  magic mouthwash, 5 mL, Oral, TID PRN, Lance Coon, MD, 5 mL at 12/16/18 0917 .  ondansetron (ZOFRAN) tablet 4 mg, 4 mg, Oral, Q6H PRN **OR** ondansetron (ZOFRAN) injection 4 mg, 4 mg, Intravenous, Q6H PRN, Lance Coon, MD .  oxyCODONE-acetaminophen (PERCOCET/ROXICET) 5-325 MG per tablet 1-2 tablet, 1-2 tablet, Oral, Q4H PRN, Henreitta Leber, MD, 2 tablet at 12/16/18 0920 .  thiothixene (NAVANE) capsule 10 mg, 10 mg, Oral, QHS, Lance Coon, MD, 10 mg at 12/15/18 2009  Facility-Administered Medications Ordered in Other Encounters:  .  heparin lock flush 100 unit/mL, 500 Units, Intravenous, Once, Sindy Guadeloupe, MD .  heparin lock flush 100 unit/mL, 500 Units, Intravenous, Once, Corcoran, Melissa C, MD .  heparin lock flush 100 unit/mL, 500 Units, Intravenous, Once, Corcoran, Melissa C, MD .  sodium chloride flush (NS) 0.9 % injection 10 mL, 10 mL, Intravenous, PRN, Sindy Guadeloupe, MD, 10 mL at 10/05/18 0845 .  sodium chloride flush (NS) 0.9 % injection 10 mL, 10 mL, Intravenous, PRN, Mike Gip, Melissa C, MD, 10 mL at 11/09/18 0840 .  sodium chloride flush (NS) 0.9 % injection 10 mL, 10 mL, Intravenous, PRN, Mike Gip, Melissa C, MD, 10 mL at 11/23/18 3845   Social History: Social History   Tobacco Use  . Smoking status: Former Smoker    Last attempt to quit: 10/18/2014    Years since quitting: 4.1  . Smokeless tobacco: Never Used  Substance Use Topics  . Alcohol use: No  . Drug use: Never    Family Medical History: Family History  Problem Relation Age of Onset  . Cancer Father   . Breast cancer Neg Hx     Physical Examination: Vitals:   12/15/18 1915  12/16/18 0423  BP: (!) 143/62 140/79  Pulse: (!) 103 92  Resp: 18 18  Temp: 98.1 F (36.7 C) 98 F (36.7 C)  SpO2: 95% 92%     General: Patient is well developed, well nourished, calm, collected, and in no apparent distress.  Psychiatric: Patient is non-anxious.  Head:  Pupils equal, round, and reactive to light.  ENT:  Oral mucosa appears well hydrated.  Neck:   Supple.  Full range of motion.  Respiratory: Patient is breathing without any difficulty.  Extremities: No edema.  Vascular: Palpable pulses in dorsal pedal vessels.  Skin:   On exposed skin, there are no abnormal skin lesions.  NEUROLOGICAL:  General: In no acute distress.   Awake, alert, oriented to person, place, and time.   ROM of spine: Not assessed.  Palpation of spine: Nontender to palpation through spine and buttocks.  Strength:  Side Iliopsoas Quads Hamstring PF DF EHL  R 4+ 5 4+ 5 5 5   L 5 5 5 5 5 5    Reflexes are 2+ and symmetric at the patella and  achilles.   Bilateral lower extremity sensation is intact to light touch and pin prick.  Clonus is not present.  Toes are down-going.  Gait not assessed.  Imaging: EXAM: LUMBAR SPINE - COMPLETE 4+ VIEW 12/14/2018  COMPARISON:  CT scan of June 17, 2018.  FINDINGS: Old L2 compression fracture is noted. Moderate compression deformities of the L3 and L4 vertebral bodies are also noted which are new since prior exam. Moderate degenerative disc disease is noted at L5-S1. Degenerative changes are seen involving posterior facet joints of L4-5 and L5-S1. Atherosclerosis of abdominal aorta is noted.  IMPRESSION: Stable old L2 compression fracture is noted. New L3 and L4 compression fractures are noted that appear to be at least subacute or chronic, but acute fracture can not be excluded. MRI may be performed for further evaluation.   EXAM: CT ABDOMEN AND PELVIS WITHOUT CONTRAST 12/14/2018  TECHNIQUE: Multidetector CT imaging of the  abdomen and pelvis was performed following the standard protocol without IV contrast.  COMPARISON:  Prior radiograph from earlier same day as well as prior CT from 06/17/2018  FINDINGS: Lower chest: Scattered subsegmental atelectatic changes present within the visualized lung bases. Partially visualized lungs are otherwise clear.  Hepatobiliary: Subcentimeter hypodensity within the left hepatic lobe, too small the characterize. Noncontrast evaluation liver otherwise unremarkable. Cholelithiasis with no imaging findings to suggest acute cholecystitis. No biliary dilatation.  Pancreas: Pancreas within normal limits.  Spleen: Spleen within normal limits.  Adrenals/Urinary Tract: Adrenal glands are normal. Kidneys equal in size without nephrolithiasis or obstructive uropathy. No radiopaque calculi seen along the course of either renal collecting system. Bladder moderately distended without acute finding.  Stomach/Bowel: Stomach within normal limits. No evidence for bowel obstruction. No acute inflammatory changes seen about the bowels. Prior partial colectomy with anastomotic suture at the hepatic flexure.  Vascular/Lymphatic: Moderate aorto bi-iliac atherosclerotic disease. No aneurysm. No adenopathy.  Reproductive: Uterus and ovaries within normal limits. Pessary device in place.  Other: No free air or fluid.  Musculoskeletal: Chronic L2 compression fracture, stable. Additional compression deformities involving the superior endplates of L3 and L4 are acute to subacute in appearance. Associated height loss of up to 35% at L3 with 4 mm bony retropulsion. Height loss measures up to 50% at L4 with 6 mm bony retropulsion. Additional acute nondisplaced fracture involves the left sacral ala. Probable additional subtle acute nondisplaced fracture through the right sacral ala as well. Acute nondisplaced fracture through the right L5 transverse process. No discrete lytic  or blastic osseous lesions. Additional nondisplaced fracture extends through the right pubis symphysis. Prominent osteoarthritic changes about the hips.  IMPRESSION: 1. Acute to subacute fractures involving the L3 and L4 vertebral bodies with up to 40-50% height loss and 4-6 mm bony retropulsion as above. 2. Additional acute nondisplaced fractures of the right transverse process of L5, bilateral sacral ala, and right pubis symphysis. 3. No other acute intra-abdominal or pelvic process. 4. Cholelithiasis.  EXAM: MRI PELVIS WITHOUT CONTRAST 12/15/2018  TECHNIQUE: Multiplanar multisequence MR imaging of the pelvis was performed. No intravenous contrast was administered.  COMPARISON:  CT scan 12/14/2018  FINDINGS: Urinary Tract: The bladder is moderately distended. No bladder mass or calculi.  Bowel:  The visualized small bowel and colon are unremarkable.  Vascular/Lymphatic: No aneurysm or adenopathy.  Reproductive:  Surgically absent.  Other:  Pessary ring noted in the vagina.  Musculoskeletal: Typical H-shaped sacral insufficiency fractures. No displacement. No worrisome bone lesions.  IMPRESSION: 1. Typical H-shaped sacral insufficiency fractures as demonstrated on  the CT scan from yesterday. 2. Moderate bladder distention.   EXAM: MRI LUMBAR SPINE WITHOUT CONTRAST. 12/15/2018  TECHNIQUE: Multiplanar, multisequence MR imaging of the lumbar spine was performed. No intravenous contrast was administered.  COMPARISON:  12/14/2018 CT abdomen and pelvis  FINDINGS: Segmentation:  Standard.  Alignment:  Stable L4-5 grade 1 anterolisthesis.  Vertebrae: Chronic L2 compression fracture with 40% central loss of vertebral body height and 6 mm superior endplate retropulsion. L3 and L4 superior endplate fractures involving anterior and middle columns with 40% loss of vertebral body height and edema within the superior endplates indicating recent injury.  4 mm L3 and 6 mm L4 retropulsion of superior endplates. Bilateral L4-5 facet effusions and edema, likely degenerative. Bilateral sacral al a fractures with edema indicating recent injury.  Conus medullaris and cauda equina: Conus extends to the L1-2 level. Conus and cauda equina appear normal.  Paraspinal and other soft tissues: Mild edema within the lumbar paraspinal muscles and the piriformis muscles compatible with muscle strain  Disc levels:  L1-2: Disc bulge and endplate retropulsion results in mild bilateral neural foraminal stenosis and mild spinal canal stenosis.  L2-3: Disc bulge, endplate retropulsion, and facet hypertrophy results in mild bilateral foraminal stenosis and spinal canal stenosis.  L3-4: Disc bulge, endplate retropulsion, and facet hypertrophy results in mild bilateral foraminal stenosis and spinal canal stenosis.  L4-5: Grade 1 anterolisthesis with small uncovered disc bulge and bilateral facet hypertrophy results in mild bilateral foraminal stenosis. No significant spinal canal stenosis.  L5-S1: Mild disc bulge and facet hypertrophy. No significant foraminal or spinal canal stenosis.  IMPRESSION: 1. L3 and L4 superior endplate fractures involving anterior and middle columns with 40% loss of vertebral body height and edema indicating recent injury. 4 mm L3 and 6 mm L4 retropulsion of superior endplates. 2. Bilateral sacral fractures, better characterized on concurrent sacral MRI. 3. L4-5 facet edema and effusions likely representing degenerative facet arthritis. 4. Lower lumbar paraspinal muscle edema, likely muscle strain. 5. Lumbar spondylosis and multilevel endplate retropulsion with multilevel mild foraminal stenosis and spinal canal stenosis. No high-grade spinal canal stenosis.   Assessment and Plan: Kathy [name] is a pleasant 73 y.o. female with back pain and lower extremity pain in the setting of acute to subacute L3 and L4  superior and plate fractures with retropulsion, bilateral sacral ala, right L5 transverse process fracture, and right pubic symphysis fracture.  She has complaints of extension of pain into the thighs bilaterally, but MRI findings in regard to stenosis were mild.  Case was discussed with Dr. Lacinda Axon who recommends upright lumbar films, LSO brace, and PT.  If upright films remained stable patient may benefit from Ortho evaluation for consideration of kyphoplasty/sacroplasty in which case conservative management with bracing would be unnecessary.  Marin Olp, PA-C Dept. of Neurosurgery

## 2018-12-16 NOTE — Progress Notes (Signed)
PT Cancellation Note  Patient Details Name: Kathy [name] MRN: [id number] DOB: 02/20/1946   Cancelled Treatment:    Reason Eval/Treat Not Completed: Other (comment)   Chart reviewed.  Pt awaiting neuro consult.  Discussed with primary PT on site.  Will hold session until consult is complete and continue as appropriate.   Chesley Noon 12/16/2018, 10:38 AM

## 2018-12-16 NOTE — Consult Note (Addendum)
Patient was examined and records reviewed including Dr. Saralyn Pilar his x-rays from [facility] earlier in the week.  Of note is that she up until just a week and half to 2 weeks ago she was going to the grocery store and walking very independently walking on a treadmill.  She now has difficulty getting up at all with severe pain in the low back.  On exam to percussion she is mildly tender at L3 and 4 and at the sacrum but the sacrum is the area which she points to when she does this area that of pain when she sits up or tries to bear weight.  She has no clonus and is able flex extend the toes and ankles without any weakness.  I reviewed the MRI of the sacrum and lumbar spine with the patient and her daughter.  Impression is sacral insufficiency fracture extensive, subacute L3 and L4 compression fractures with old L2 compression fracture.  Recommendation is for kyphoplasty at L3 and 4 with sacroplasty bilateral.  This I think would allow for mobilization in a more rapid fashion.  Will tentatively schedule for tomorrow, further discussion and information for patient and family this pm.

## 2018-12-16 NOTE — Progress Notes (Signed)
[facility] NAME: Kathy [name]    MR#:  [id number]  [place]:  09/05/1976  SUBJECTIVE:   Patient had 2 Percocet this morning which is helping with back pain  REVIEW OF SYSTEMS:    Review of Systems  Constitutional: Negative for fever, chills weight loss HENT: Negative for ear pain, nosebleeds, congestion, facial swelling, rhinorrhea, neck pain, neck stiffness and ear discharge.   Respiratory: Negative for cough, shortness of breath, wheezing  Cardiovascular: Negative for chest pain, palpitations and leg swelling.  Gastrointestinal: Negative for heartburn, abdominal pain, vomiting, diarrhea or consitpation Genitourinary: Negative for dysuria, urgency, frequency, hematuria Musculoskeletal: ++ back pain  Neurological: Negative for dizziness, seizures, syncope, focal weakness,  numbness and headaches.  Hematological: Does not bruise/bleed easily.  Psychiatric/Behavioral: Negative for hallucinations, confusion, dysphoric mood    Tolerating Diet: yes      DRUG ALLERGIES:   Allergies  Allergen Reactions  . Biaxin [Clarithromycin] Shortness Of Breath  . Levofloxacin     Muscle pain  . Penicillins Rash    Has patient had a PCN reaction causing immediate rash, facial/tongue/throat swelling, SOB or lightheadedness with hypotension: no Has patient had a PCN reaction causing severe rash involving mucus membranes or skin necrosis: no Has patient had a PCN reaction that required hospitalization: no Has patient had a PCN reaction occurring within the last 10 years: no If all of the above answers are "NO", then may proceed with Cephalosporin use.     VITALS:  Blood pressure 140/79, pulse 92, temperature 98 F (36.7 C), temperature source Oral, resp. rate 18, height 5\' 4"  (1.626 m), weight 70 kg, SpO2 92 %.  PHYSICAL EXAMINATION:  Constitutional: Appears well-developed and well-nourished. No distress. HENT: Normocephalic. Marland Kitchen  Oropharynx is clear and moist.  Eyes: Conjunctivae and EOM are normal. PERRLA, no scleral icterus.  Neck: Normal ROM. Neck supple. No JVD. No tracheal deviation. CVS: RRR, S1/S2 +, no murmurs, no gallops, no carotid bruit.  Pulmonary: Effort and breath sounds normal, no stridor, rhonchi, wheezes, rales.  Abdominal: Soft. BS +,  no distension, tenderness, rebound or guarding.  Musculoskeletal: Normal range of motion. No edema and no tenderness.  Neuro: Alert. CN 2-12 grossly intact. No focal deficits. Skin: Skin is warm and dry. No rash noted. Psychiatric: Normal mood and affect.      LABORATORY PANEL:   CBC Recent Labs  Lab 12/15/18 0504  WBC 11.4*  HGB 11.6*  HCT 36.5  PLT 283   ------------------------------------------------------------------------------------------------------------------  Chemistries  Recent Labs  Lab 12/14/18 1646 12/15/18 0504  NA 138 142  K 4.0 3.4*  CL 104 111  CO2 25 26  GLUCOSE 133* 91  BUN 23 17  CREATININE 0.51 0.50  CALCIUM 9.3 9.0  MG  --  2.0  AST 24  --   ALT 22  --   ALKPHOS 172*  --   BILITOT 0.6  --    ------------------------------------------------------------------------------------------------------------------  Cardiac Enzymes No results for input(s): TROPONINI in the last 168 hours. ------------------------------------------------------------------------------------------------------------------  RADIOLOGY:  Dg Lumbar Spine Complete  Result Date: 12/14/2018 CLINICAL DATA:  Lower back pain. EXAM: LUMBAR SPINE - COMPLETE 4+ VIEW COMPARISON:  CT scan of June 17, 2018. FINDINGS: Old L2 compression fracture is noted. Moderate compression deformities of the L3 and L4 vertebral bodies are also noted which are new since prior exam. Moderate degenerative disc disease is noted at L5-S1. Degenerative changes are seen involving posterior facet joints of L4-5  and L5-S1. Atherosclerosis of abdominal aorta is noted. IMPRESSION:  Stable old L2 compression fracture is noted. New L3 and L4 compression fractures are noted that appear to be at least subacute or chronic, but acute fracture can not be excluded. MRI may be performed for further evaluation. Electronically Signed   By: Marijo Conception, M.D.   On: 12/14/2018 14:55   Mr Lumbar Spine Wo Contrast  Result Date: 12/15/2018 CLINICAL DATA:  73 y/o F; compression fractures and bilateral sacral fractures. Recent history of colon cancer. EXAM: MRI LUMBAR SPINE WITHOUT CONTRAST TECHNIQUE: Multiplanar, multisequence MR imaging of the lumbar spine was performed. No intravenous contrast was administered. COMPARISON:  12/14/2018 CT abdomen and pelvis FINDINGS: Segmentation:  Standard. Alignment:  Stable L4-5 grade 1 anterolisthesis. Vertebrae: Chronic L2 compression fracture with 40% central loss of vertebral body height and 6 mm superior endplate retropulsion. L3 and L4 superior endplate fractures involving anterior and middle columns with 40% loss of vertebral body height and edema within the superior endplates indicating recent injury. 4 mm L3 and 6 mm L4 retropulsion of superior endplates. Bilateral L4-5 facet effusions and edema, likely degenerative. Bilateral sacral al a fractures with edema indicating recent injury. Conus medullaris and cauda equina: Conus extends to the L1-2 level. Conus and cauda equina appear normal. Paraspinal and other soft tissues: Mild edema within the lumbar paraspinal muscles and the piriformis muscles compatible with muscle strain Disc levels: L1-2: Disc bulge and endplate retropulsion results in mild bilateral neural foraminal stenosis and mild spinal canal stenosis. L2-3: Disc bulge, endplate retropulsion, and facet hypertrophy results in mild bilateral foraminal stenosis and spinal canal stenosis. L3-4: Disc bulge, endplate retropulsion, and facet hypertrophy results in mild bilateral foraminal stenosis and spinal canal stenosis. L4-5: Grade 1 anterolisthesis  with small uncovered disc bulge and bilateral facet hypertrophy results in mild bilateral foraminal stenosis. No significant spinal canal stenosis. L5-S1: Mild disc bulge and facet hypertrophy. No significant foraminal or spinal canal stenosis. IMPRESSION: 1. L3 and L4 superior endplate fractures involving anterior and middle columns with 40% loss of vertebral body height and edema indicating recent injury. 4 mm L3 and 6 mm L4 retropulsion of superior endplates. 2. Bilateral sacral fractures, better characterized on concurrent sacral MRI. 3. L4-5 facet edema and effusions likely representing degenerative facet arthritis. 4. Lower lumbar paraspinal muscle edema, likely muscle strain. 5. Lumbar spondylosis and multilevel endplate retropulsion with multilevel mild foraminal stenosis and spinal canal stenosis. No high-grade spinal canal stenosis. Electronically Signed   By: Kristine Garbe M.D.   On: 12/15/2018 02:18   Mr Sacrum Si Joints Wo Contrast  Result Date: 12/15/2018 CLINICAL DATA:  Back pain. EXAM: MRI PELVIS WITHOUT CONTRAST TECHNIQUE: Multiplanar multisequence MR imaging of the pelvis was performed. No intravenous contrast was administered. COMPARISON:  CT scan 12/14/2018 FINDINGS: Urinary Tract: The bladder is moderately distended. No bladder mass or calculi. Bowel:  The visualized small bowel and colon are unremarkable. Vascular/Lymphatic: No aneurysm or adenopathy. Reproductive:  Surgically absent. Other:  Pessary ring noted in the vagina. Musculoskeletal: Typical H-shaped sacral insufficiency fractures. No displacement. No worrisome bone lesions. IMPRESSION: 1. Typical H-shaped sacral insufficiency fractures as demonstrated on the CT scan from yesterday. 2. Moderate bladder distention. Electronically Signed   By: Marijo Sanes M.D.   On: 12/15/2018 07:53   Ct Renal Stone Study  Result Date: 12/14/2018 CLINICAL DATA:  Initial evaluation for acute lower back pain. EXAM: CT ABDOMEN AND  PELVIS WITHOUT CONTRAST TECHNIQUE: Multidetector CT imaging of the  abdomen and pelvis was performed following the standard protocol without IV contrast. COMPARISON:  Prior radiograph from earlier same day as well as prior CT from 06/17/2018 FINDINGS: Lower chest: Scattered subsegmental atelectatic changes present within the visualized lung bases. Partially visualized lungs are otherwise clear. Hepatobiliary: Subcentimeter hypodensity within the left hepatic lobe, too small the characterize. Noncontrast evaluation liver otherwise unremarkable. Cholelithiasis with no imaging findings to suggest acute cholecystitis. No biliary dilatation. Pancreas: Pancreas within normal limits. Spleen: Spleen within normal limits. Adrenals/Urinary Tract: Adrenal glands are normal. Kidneys equal in size without nephrolithiasis or obstructive uropathy. No radiopaque calculi seen along the course of either renal collecting system. Bladder moderately distended without acute finding. Stomach/Bowel: Stomach within normal limits. No evidence for bowel obstruction. No acute inflammatory changes seen about the bowels. Prior partial colectomy with anastomotic suture at the hepatic flexure. Vascular/Lymphatic: Moderate aorto bi-iliac atherosclerotic disease. No aneurysm. No adenopathy. Reproductive: Uterus and ovaries within normal limits. Pessary device in place. Other: No free air or fluid. Musculoskeletal: Chronic L2 compression fracture, stable. Additional compression deformities involving the superior endplates of L3 and L4 are acute to subacute in appearance. Associated height loss of up to 35% at L3 with 4 mm bony retropulsion. Height loss measures up to 50% at L4 with 6 mm bony retropulsion. Additional acute nondisplaced fracture involves the left sacral ala. Probable additional subtle acute nondisplaced fracture through the right sacral ala as well. Acute nondisplaced fracture through the right L5 transverse process. No discrete lytic  or blastic osseous lesions. Additional nondisplaced fracture extends through the right pubis symphysis. Prominent osteoarthritic changes about the hips. IMPRESSION: 1. Acute to subacute fractures involving the L3 and L4 vertebral bodies with up to 40-50% height loss and 4-6 mm bony retropulsion as above. 2. Additional acute nondisplaced fractures of the right transverse process of L5, bilateral sacral ala, and right pubis symphysis. 3. No other acute intra-abdominal or pelvic process. 4. Cholelithiasis. Electronically Signed   By: Jeannine Boga M.D.   On: 12/14/2018 21:10     ASSESSMENT AND PLAN:    73 year old female with history of COPD who presented to the emergency room due to back pain.   1.  Back pain: Imaging is above.  Patient with acute to subacute fractures involving L3 and L4 with severe spondylolysis Followed by neurosurgery consultation Patient may benefit from kyphoplasty follow-up on orthopedic consultation Continue pain control and physical therapy  2.  Urinary tract infection: Urine culture suggest to be collection Change Rocephin to Keflex  3.  COPD without signs of exacerbation   4.  Hypokalemia: Replete   Needs PT and ortho f/u   Management plans discussed with the patient and she is in agreement.  CODE STATUS: FULL  TOTAL TIME TAKING CARE OF THIS PATIENT: 26 minutes.     POSSIBLE D/C 2 days, DEPENDING ON CLINICAL CONDITION.   Kataleah Bejar M.D on 12/16/2018 at 11:17 AM  Between 7am to 6pm - Pager - [phone] After 6pm go to [email or web address] - password [email or web address] [facility]  Office  [phone]  CC: Primary care physician; Clarisse Gouge, MD  Note: This dictation was prepared with Dragon dictation along with smaller phrase technology. Any transcriptional errors that result from this process are unintentional.

## 2018-12-17 ENCOUNTER — Observation Stay: Payer: Medicare Other | Admitting: Certified Registered"

## 2018-12-17 ENCOUNTER — Ambulatory Visit: Admission: RE | Admit: 2018-12-17 | Payer: Medicare Other | Source: Ambulatory Visit

## 2018-12-17 ENCOUNTER — Encounter: Payer: Self-pay | Admitting: *Deleted

## 2018-12-17 ENCOUNTER — Ambulatory Visit: Payer: Medicare Other

## 2018-12-17 ENCOUNTER — Observation Stay: Payer: Medicare Other

## 2018-12-17 ENCOUNTER — Encounter
Admission: EM | Disposition: A | Discharge status: Skilled Nursing Facility | Payer: Self-pay | Source: Home / Self Care | Attending: Internal Medicine

## 2018-12-17 HISTORY — PX: KYPHOPLASTY: SHX5884

## 2018-12-17 LAB — SURGICAL PCR SCREEN
MRSA, PCR: NEGATIVE
Staphylococcus aureus: NEGATIVE

## 2018-12-17 SURGERY — KYPHOPLASTY
Anesthesia: General

## 2018-12-17 MED ORDER — HYDROMORPHONE HCL 1 MG/ML IJ SOLN: 0.2500 mg | INTRAMUSCULAR | Status: DC | PRN

## 2018-12-17 MED ORDER — PROPOFOL 10 MG/ML IV BOLUS
INTRAVENOUS | Status: DC | PRN
  Administered 2018-12-17: 20 mg via INTRAVENOUS

## 2018-12-17 MED ORDER — CLINDAMYCIN PHOSPHATE 600 MG/50ML IV SOLN
INTRAVENOUS | Status: AC
  Filled 2018-12-17: qty 50

## 2018-12-17 MED ORDER — MAGNESIUM HYDROXIDE 400 MG/5ML PO SUSP
30.0000 mL | Freq: Every day | ORAL | Status: DC | PRN
  Filled 2018-12-17: qty 30

## 2018-12-17 MED ORDER — LIDOCAINE HCL 1 % IJ SOLN
INTRAMUSCULAR | Status: DC | PRN
  Administered 2018-12-17: 40 mL

## 2018-12-17 MED ORDER — FENTANYL CITRATE (PF) 100 MCG/2ML IJ SOLN
INTRAMUSCULAR | Status: DC | PRN
  Administered 2018-12-17: 50 ug via INTRAVENOUS
  Administered 2018-12-17 (×2): 25 ug via INTRAVENOUS

## 2018-12-17 MED ORDER — METOCLOPRAMIDE HCL 5 MG PO TABS: 5.0000 mg | ORAL_TABLET | Freq: Three times a day (TID) | ORAL | Status: DC | PRN

## 2018-12-17 MED ORDER — PROPOFOL 10 MG/ML IV BOLUS
INTRAVENOUS | Status: AC
  Filled 2018-12-17: qty 20

## 2018-12-17 MED ORDER — MAGNESIUM CITRATE PO SOLN
1.0000 | Freq: Once | ORAL | Status: DC | PRN
  Filled 2018-12-17: qty 296

## 2018-12-17 MED ORDER — DOCUSATE SODIUM 100 MG PO CAPS
100.0000 mg | ORAL_CAPSULE | Freq: Two times a day (BID) | ORAL | Status: DC
  Administered 2018-12-17 – 2018-12-19 (×4): 100 mg via ORAL
  Filled 2018-12-17 (×4): qty 1

## 2018-12-17 MED ORDER — BUPIVACAINE-EPINEPHRINE (PF) 0.5% -1:200000 IJ SOLN
INTRAMUSCULAR | Status: DC | PRN
  Administered 2018-12-17: 30 mL

## 2018-12-17 MED ORDER — OXYCODONE-ACETAMINOPHEN 5-325 MG PO TABS
ORAL_TABLET | ORAL | Status: AC
  Filled 2018-12-17: qty 1

## 2018-12-17 MED ORDER — FENTANYL CITRATE (PF) 100 MCG/2ML IJ SOLN
INTRAMUSCULAR | Status: AC
  Filled 2018-12-17: qty 2

## 2018-12-17 MED ORDER — LACTATED RINGERS IV SOLN
INTRAVENOUS | Status: DC
  Administered 2018-12-17: 11:00:00 via INTRAVENOUS

## 2018-12-17 MED ORDER — BISACODYL 5 MG PO TBEC: 5.0000 mg | DELAYED_RELEASE_TABLET | Freq: Every day | ORAL | Status: DC | PRN

## 2018-12-17 MED ORDER — METOCLOPRAMIDE HCL 5 MG/ML IJ SOLN: 5.0000 mg | Freq: Three times a day (TID) | INTRAMUSCULAR | Status: DC | PRN

## 2018-12-17 MED ORDER — PROPOFOL 500 MG/50ML IV EMUL
INTRAVENOUS | Status: DC | PRN
  Administered 2018-12-17: 75 ug/kg/min via INTRAVENOUS

## 2018-12-17 SURGICAL SUPPLY — 23 items
BONE FILLER DEVICE STRL SZ3 (INSTRUMENTS) ×4 IMPLANT
CEMENT KYPHON CX01A KIT/MIXER (Cement) ×4 IMPLANT
COVER WAND RF STERILE (DRAPES) ×2 IMPLANT
DERMABOND ADVANCED (GAUZE/BANDAGES/DRESSINGS) ×1
DERMABOND ADVANCED .7 DNX12 (GAUZE/BANDAGES/DRESSINGS) ×1 IMPLANT
DEVICE BIOPSY BONE KYPHX (INSTRUMENTS) ×2 IMPLANT
DRAPE C-ARM XRAY 36X54 (DRAPES) ×2 IMPLANT
DURAPREP 26ML APPLICATOR (WOUND CARE) ×2 IMPLANT
GLOVE SURG SYN 9.0  PF PI (GLOVE) ×1
GLOVE SURG SYN 9.0 PF PI (GLOVE) ×1 IMPLANT
GOWN SRG 2XL LVL 4 RGLN SLV (GOWNS) ×1 IMPLANT
GOWN STRL NON-REIN 2XL LVL4 (GOWNS) ×1
GOWN STRL REUS W/ TWL LRG LVL3 (GOWN DISPOSABLE) ×1 IMPLANT
GOWN STRL REUS W/TWL LRG LVL3 (GOWN DISPOSABLE) ×1
KIT OSTEOCOOL BONE ACCESS 8G (MISCELLANEOUS) ×4 IMPLANT
PACK KYPHOPLASTY (MISCELLANEOUS) ×2 IMPLANT
RENTAL RFA GENERATOR (MISCELLANEOUS) IMPLANT
STRAP SAFETY 5IN WIDE (MISCELLANEOUS) ×2 IMPLANT
SYS CARTRIDGE BONE CEMENT 8ML (SYSTAGENIX WOUND MANAGEMENT) ×2
SYSTEM CARTRIDG BONE CEMNT 8ML (SYSTAGENIX WOUND MANAGEMENT) ×1 IMPLANT
SYSTEM GUN BONE FILLER SZ2 (MISCELLANEOUS) ×2 IMPLANT
TRAY KYPHOPAK 15/3 EXPRESS 1ST (MISCELLANEOUS) IMPLANT
TRAY KYPHOPAK 20/3 EXPRESS 1ST (MISCELLANEOUS) ×2 IMPLANT

## 2018-12-17 NOTE — Anesthesia Preprocedure Evaluation (Addendum)
Anesthesia Evaluation  Patient identified by MRN, date of birth, ID band Patient awake    Reviewed: Allergy & Precautions, H&P , NPO status , Patient's Chart, lab work & pertinent test results  Airway Mallampati: III  TM Distance: >3 FB    Comment: Small mouth Dental  (+) Missing   Pulmonary COPD,  COPD inhaler, former smoker,           Cardiovascular negative cardio ROS       Neuro/Psych PSYCHIATRIC DISORDERS Schizophrenia negative neurological ROS     GI/Hepatic negative GI ROS, Neg liver ROS,   Endo/Other  negative endocrine ROS  Renal/GU negative Renal ROS  negative genitourinary   Musculoskeletal   Abdominal   Peds  Hematology  (+) Blood dyscrasia, anemia ,   Anesthesia Other Findings Past Medical History: No date: Arthritis     Comment:  hips No date: B12 deficiency 07/06/2018: Colon cancer ([place]) No date: Constipation due to slow transit No date: COPD (chronic obstructive pulmonary disease) ([facility]) No date: Fibrocystic breast disease No date: Mediastinal mass     Comment:  removed 1/16 No date: Motion sickness     Comment:  fair rides No date: Osteoporosis No date: Schizophrenia ([place]) No date: Vitamin D deficiency No date: Wears dentures     Comment:  partial upper and lower  Past Surgical History: No date: BREAST BIOPSY; Left     Comment:  neg 05/04/13: BREAST BIOPSY; Right     Comment:  [place] bx/clip-neg No date: COLONOSCOPY 06/10/2018: COLONOSCOPY WITH PROPOFOL; N/A     Comment:  Procedure: COLONOSCOPY WITH PROPOFOL;  Surgeon: [place],               [name], MD;  Location: [facility];  Service:               Gastroenterology;  Laterality: N/A; 06/10/2018: ESOPHAGOGASTRODUODENOSCOPY; N/A     Comment:  Procedure: ESOPHAGOGASTRODUODENOSCOPY (EGD);  Surgeon:               [place], [name], MD;  Location: [facility];                Service: Gastroenterology;  Laterality: N/A; 11/20/2016:  HALLUX VALGUS AKIN; Right     Comment:  Procedure: HALLUX VALGUS AKIN;  Surgeon: Samara Deist,               DPM;  Location: [facility];  Service: Podiatry;               Laterality: Right; 11/20/2016: [place]; Right     Comment:  Procedure: HALLUX VALGUS AUSTIN  Alroy Dust) right;                Surgeon: Samara Deist, DPM;  Location: [facility];  Service: Podiatry;  Laterality: Right;  IVA with               Popliteal 01/15/2017: HALLUX VALGUS AUSTIN; Left     Comment:  Procedure: HALLUX VALGUS AUSTIN  Correction left foot                Iva Popiteal;  Surgeon: Samara Deist, DPM;  Location:               [facility];  Service: Podiatry;  Laterality:               Left;  IVA Popliteal  07/06/2018: LAPAROSCOPIC RIGHT  COLECTOMY; N/A     Comment:  Procedure: LAPAROSCOPIC COLECTOMY;  Surgeon: Benjamine Sprague, DO;  Location: [facility];  Service: General;                Laterality: N/A; 11/09/2014: MEDIASTINAL MASS EXCISION; Left     Comment:  Dr. Wynelle Cleveland, Media 07/30/2018: PORTACATH PLACEMENT; Right     Comment:  Procedure: INSERTION PORT-A-CATH;  Surgeon: Benjamine Sprague, DO;  Location: [facility];  Service: General;                Laterality: Right; 11/09/2014: THORACOSCOPY; Left     Comment:  with excision mediastinal mass No date: TUBAL LIGATION  BMI    Body Mass Index:  26.49 kg/m      Reproductive/Obstetrics negative OB ROS                            Anesthesia Physical Anesthesia Plan  ASA: III  Anesthesia Plan: General   Post-op Pain Management:    Induction:   PONV Risk Score and Plan: Propofol infusion and TIVA  Airway Management Planned: Natural Airway and Nasal Cannula  Additional Equipment:   Intra-op Plan:   Post-operative Plan:   Informed Consent: I have reviewed the patients History and Physical, chart, labs and discussed the procedure including the risks,  benefits and alternatives for the proposed anesthesia with the patient or authorized representative who has indicated his/her understanding and acceptance.     Dental Advisory Given  Plan Discussed with: Anesthesiologist and CRNA  Anesthesia Plan Comments:         Anesthesia Quick Evaluation

## 2018-12-17 NOTE — Anesthesia Postprocedure Evaluation (Signed)
Anesthesia Post Note  Patient: Kathy [name]  Procedure(s) Performed: KYPHOPLASTY L3, L4 SACROPLASTY (N/A )  Patient location during evaluation: PACU Anesthesia Type: General Level of consciousness: awake and alert Pain management: pain level controlled Vital Signs Assessment: post-procedure vital signs reviewed and stable Respiratory status: spontaneous breathing, nonlabored ventilation, respiratory function stable and patient connected to nasal cannula oxygen Cardiovascular status: blood pressure returned to baseline and stable Postop Assessment: no apparent nausea or vomiting Anesthetic complications: no     Last Vitals:  Vitals:   12/17/18 1423 12/17/18 1433  BP: 137/69 132/70  Pulse: 78 79  Resp: 15 15  Temp: 37 C   SpO2: 100% 99%    Last Pain:  Vitals:   12/17/18 1423  TempSrc:   PainSc: 0-No pain                 [name]

## 2018-12-17 NOTE — Op Note (Signed)
Date  12/17/2018  Time  1:43 pm   PATIENT:  Kathy [name]   PRE-OPERATIVE DIAGNOSIS:  closed wedge compression fracture of L3, L4 and sacral insufficiency fracture bilateral   POST-OPERATIVE DIAGNOSIS:  closed wedge compression fracture of L3 and L4 with sacral insufficiency fractures bilateral   PROCEDURE:  Procedure(s): KYPHOPLASTY L3 and L4 with lumbosacral vertebroplasty (sacroplasty)  SURGEON: Laurene Footman, MD   ASSISTANTS: None   ANESTHESIA:   local and MAC   EBL:  No intake/output data recorded.   BLOOD ADMINISTERED:none   DRAINS: none    LOCAL MEDICATIONS USED:  MARCAINE    and XYLOCAINE    SPECIMEN:   L3 and L4 vertebral body biopsies   DISPOSITION OF SPECIMEN:  Pathology   COUNTS:  YES   TOURNIQUET:  * No tourniquets in log *   IMPLANTS: Bone cement   DICTATION: .Dragon Dictation  patient was brought to the operating room and after adequate anesthesia was obtained the patient was placed prone.  C arm was brought in in good visualization of the affected level s in the lumbar spine obtained on both AP and lateral projections.  After patient identification and timeout procedures were completed, local anesthetic was infiltrated with 10 cc 1% Xylocaine infiltrated subcutaneously.  This is done the area on the right side of the planned approach.  The back was then prepped and draped in the usual sterile manner and repeat timeout procedure carried out.  A spinal needle was brought down to the pedicle on the right side of  L3 and 4 and a 50-50 mix of 1% Xylocaine half percent Sensorcaine with epinephrine total of 20 cc injected.  After allowing this to set a small incision was made and the trocar was advanced into the vertebral body in an extrapedicular fashion.  Biopsy was obtained at both levels Drilling was carried out balloon inserted with inflation to  for cc at L3 and 3 at L3-4.  When the cement was appropriate consistency 6 cc at L3 and 4 at L for were injected into the  vertebral body without extravasation, good fill superior to inferior endplates and from right to left sides along the inferior endplate.  After the cement had set the trochar was removed and permanent C-arm views obtained.    Next the sacral plasty was performed by first giving local in the area of planned with a spinal needle down to the bone.  The x-ray was rotated to get in and out of view of the sacroiliac joint first on the left than the right with a trocar advanced into the S1 body on both sides when the cement was mixed in the appropriate consistency approximately 4 cc of bone cement was infiltrated into the S1 body on both sides with good fill and interdigitation when the cement was set the trochars removed and permanent C arm views obtained, the wound were closed for the lumbar kyphoplasty's as well as the sacral plasty with Dermabond followed by Band-Aid   PLAN OF CARE:  Continue as inpatient   PATIENT DISPOSITION:  PACU - hemodynamically stable.

## 2018-12-17 NOTE — Progress Notes (Signed)
PT Cancellation Note  Patient Details Name: Kathy [name] MRN: [id number] DOB: 04-Nov-1945   Cancelled Treatment:    Reason Eval/Treat Not Completed: Other (comment)   Pt scheduled for kyphoplasty today.  Will continue as appropriate.   Chesley Noon 12/17/2018, 10:56 AM

## 2018-12-17 NOTE — Transfer of Care (Signed)
Immediate Anesthesia Transfer of Care Note  Patient: Kathy [name]  Procedure(s) Performed: KYPHOPLASTY L3, L4 SACROPLASTY (N/A )  Patient Location: PACU  Anesthesia Type:General  Level of Consciousness: awake, alert  and oriented  Airway & Oxygen Therapy: Patient Spontanous Breathing  Post-op Assessment: Report given to RN and Post -op Vital signs reviewed and stable  Post vital signs: Reviewed and stable  Last Vitals:  Vitals Value Taken Time  BP    Temp    Pulse    Resp    SpO2      Last Pain:  Vitals:   12/17/18 1108  TempSrc:   PainSc: 10-Worst pain ever      Patients Stated Pain Goal: 0 (09/92/78 0044)  Complications: No apparent anesthesia complications

## 2018-12-17 NOTE — Progress Notes (Signed)
Moving legs well

## 2018-12-17 NOTE — Anesthesia Post-op Follow-up Note (Signed)
Anesthesia QCDR form completed.        

## 2018-12-17 NOTE — Progress Notes (Signed)
Moving legs well  Dr Rudene Christians in to see pt

## 2018-12-17 NOTE — Progress Notes (Signed)
[place] [facility] [place] NAME: Kathy [name]    MR#:  [id number]  DATE OF BIRTH:  1946-05-11  SUBJECTIVE:  No acute issues overnight.  Back pain better controlled.  Plan for kyphoplasty today  REVIEW OF SYSTEMS:    Review of Systems  Constitutional: Negative for fever, chills weight loss HENT: Negative for ear pain, nosebleeds, congestion, facial swelling, rhinorrhea, neck pain, neck stiffness and ear discharge.   Respiratory: Negative for cough, shortness of breath, wheezing  Cardiovascular: Negative for chest pain, palpitations and leg swelling.  Gastrointestinal: Negative for heartburn, abdominal pain, vomiting, diarrhea or consitpation Genitourinary: Negative for dysuria, urgency, frequency, hematuria Musculoskeletal: ++ back pain (better with pain meds) Neurological: Negative for dizziness, seizures, syncope, focal weakness,  numbness and headaches.  Hematological: Does not bruise/bleed easily.  Psychiatric/Behavioral: Negative for hallucinations, confusion, dysphoric mood    Tolerating Diet:  Npo for kypho     DRUG ALLERGIES:   Allergies  Allergen Reactions  . Biaxin [Clarithromycin] Shortness Of Breath  . Levofloxacin     Muscle pain  . Penicillins Rash    Has patient had a PCN reaction causing immediate rash, facial/tongue/throat swelling, SOB or lightheadedness with hypotension: no Has patient had a PCN reaction causing severe rash involving mucus membranes or skin necrosis: no Has patient had a PCN reaction that required hospitalization: no Has patient had a PCN reaction occurring within the last 10 years: no If all of the above answers are "NO", then may proceed with Cephalosporin use.     VITALS:  Blood pressure 138/80, pulse 89, temperature (!) 97.5 F (36.4 C), temperature source Oral, resp. rate 16, height 5\' 4"  (1.626 m), weight 70 kg, SpO2 95 %.  PHYSICAL EXAMINATION:  Constitutional: Appears well-developed and  well-nourished. No distress. HENT: Normocephalic. Marland Kitchen Oropharynx is clear and moist.  Eyes: Conjunctivae and EOM are normal. PERRLA, no scleral icterus.  Neck: Normal ROM. Neck supple. No JVD. No tracheal deviation. CVS: RRR, S1/S2 +, no murmurs, no gallops, no carotid bruit.  Pulmonary: Effort and breath sounds normal, no stridor, rhonchi, wheezes, rales.  Abdominal: Soft. BS +,  no distension, tenderness, rebound or guarding.  Musculoskeletal: Normal range of motion. No edema and no tenderness.  Neuro: Alert. CN 2-12 grossly intact. No focal deficits. Skin: Skin is warm and dry. No rash noted. Psychiatric: Normal mood and affect.      LABORATORY PANEL:   CBC Recent Labs  Lab 12/15/18 0504  WBC 11.4*  HGB 11.6*  HCT 36.5  PLT 283   ------------------------------------------------------------------------------------------------------------------  Chemistries  Recent Labs  Lab 12/14/18 1646 12/15/18 0504  NA 138 142  K 4.0 3.4*  CL 104 111  CO2 25 26  GLUCOSE 133* 91  BUN 23 17  CREATININE 0.51 0.50  CALCIUM 9.3 9.0  MG  --  2.0  AST 24  --   ALT 22  --   ALKPHOS 172*  --   BILITOT 0.6  --    ------------------------------------------------------------------------------------------------------------------  Cardiac Enzymes No results for input(s): TROPONINI in the last 168 hours. ------------------------------------------------------------------------------------------------------------------  RADIOLOGY:  No results found.   ASSESSMENT AND PLAN:    73 year old female with history of COPD who presented to the emergency room due to back pain.   1.  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  Urinary tract infection:  Urine culture suggest to be collection Continue Keflex through tomorrow  3.  COPD without  signs of exacerbation   4.  Hypokalemia: Replete    Will await PT consultation for discharge planning Management plans discussed with the patient and she is in agreement.  CODE STATUS: FULL  TOTAL TIME TAKING CARE OF THIS PATIENT: 24 minutes.     POSSIBLE D/C tomorrow, DEPENDING ON CLINICAL CONDITION.   Kathy [name] M.D on 12/17/2018 at 10:47 AM  Between 7am to 6pm - Pager - [phone] After 6pm go to [email or web address] - password [email or web address] [facility]  Office  [phone]  CC: Primary care physician; Clarisse Gouge, MD  Note: This dictation was prepared with Dragon dictation along with smaller phrase technology. Any transcriptional errors that result from this process are unintentional.

## 2018-12-18 ENCOUNTER — Encounter: Payer: Self-pay | Admitting: Orthopedic Surgery

## 2018-12-18 DIAGNOSIS — W19XXXA Unspecified fall, initial encounter: Secondary | ICD-10-CM | POA: Diagnosis present

## 2018-12-18 DIAGNOSIS — K8020 Calculus of gallbladder without cholecystitis without obstruction: Secondary | ICD-10-CM | POA: Diagnosis present

## 2018-12-18 DIAGNOSIS — J449 Chronic obstructive pulmonary disease, unspecified: Secondary | ICD-10-CM | POA: Diagnosis present

## 2018-12-18 DIAGNOSIS — E559 Vitamin D deficiency, unspecified: Secondary | ICD-10-CM | POA: Diagnosis present

## 2018-12-18 DIAGNOSIS — F209 Schizophrenia, unspecified: Secondary | ICD-10-CM | POA: Diagnosis present

## 2018-12-18 DIAGNOSIS — Z79890 Hormone replacement therapy (postmenopausal): Secondary | ICD-10-CM | POA: Diagnosis not present

## 2018-12-18 DIAGNOSIS — Z9221 Personal history of antineoplastic chemotherapy: Secondary | ICD-10-CM | POA: Diagnosis not present

## 2018-12-18 DIAGNOSIS — Z87891 Personal history of nicotine dependence: Secondary | ICD-10-CM | POA: Diagnosis not present

## 2018-12-18 DIAGNOSIS — M4726 Other spondylosis with radiculopathy, lumbar region: Secondary | ICD-10-CM | POA: Diagnosis present

## 2018-12-18 DIAGNOSIS — M8448XA Pathological fracture, other site, initial encounter for fracture: Secondary | ICD-10-CM | POA: Diagnosis present

## 2018-12-18 DIAGNOSIS — Z85038 Personal history of other malignant neoplasm of large intestine: Secondary | ICD-10-CM | POA: Diagnosis not present

## 2018-12-18 DIAGNOSIS — Z7982 Long term (current) use of aspirin: Secondary | ICD-10-CM | POA: Diagnosis not present

## 2018-12-18 DIAGNOSIS — D6481 Anemia due to antineoplastic chemotherapy: Secondary | ICD-10-CM | POA: Diagnosis present

## 2018-12-18 DIAGNOSIS — N390 Urinary tract infection, site not specified: Secondary | ICD-10-CM | POA: Diagnosis present

## 2018-12-18 DIAGNOSIS — Z883 Allergy status to other anti-infective agents status: Secondary | ICD-10-CM | POA: Diagnosis not present

## 2018-12-18 DIAGNOSIS — Z79899 Other long term (current) drug therapy: Secondary | ICD-10-CM | POA: Diagnosis not present

## 2018-12-18 DIAGNOSIS — Z7952 Long term (current) use of systemic steroids: Secondary | ICD-10-CM | POA: Diagnosis not present

## 2018-12-18 DIAGNOSIS — Z881 Allergy status to other antibiotic agents status: Secondary | ICD-10-CM | POA: Diagnosis not present

## 2018-12-18 DIAGNOSIS — Z880 Allergy status to penicillin: Secondary | ICD-10-CM | POA: Diagnosis not present

## 2018-12-18 DIAGNOSIS — E876 Hypokalemia: Secondary | ICD-10-CM | POA: Diagnosis present

## 2018-12-18 DIAGNOSIS — Z9049 Acquired absence of other specified parts of digestive tract: Secondary | ICD-10-CM | POA: Diagnosis not present

## 2018-12-18 DIAGNOSIS — M818 Other osteoporosis without current pathological fracture: Secondary | ICD-10-CM | POA: Diagnosis present

## 2018-12-18 MED ORDER — METHYLPREDNISOLONE SODIUM SUCC 40 MG IJ SOLR
40.0000 mg | Freq: Four times a day (QID) | INTRAMUSCULAR | Status: AC
  Administered 2018-12-18 (×3): 40 mg via INTRAVENOUS
  Filled 2018-12-18 (×3): qty 1

## 2018-12-18 MED ORDER — OXYCODONE-ACETAMINOPHEN 5-325 MG PO TABS: 1.0000 | ORAL_TABLET | ORAL | 0 refills | Status: DC | PRN

## 2018-12-18 MED ORDER — PREDNISONE 10 MG (21) PO TBPK: ORAL_TABLET | ORAL | 0 refills | Status: DC

## 2018-12-18 NOTE — Discharge Instructions (Signed)
Back Exercises If you have pain in your back, do these exercises 2-3 times each day or as told by your doctor. When the pain goes away, do the exercises once each day, but repeat the steps more times for each exercise (do more repetitions). If you do not have pain in your back, do these exercises once each day or as told by your doctor. Exercises Single Knee to Chest Do these steps 3-5 times in a row for each leg: 1. Lie on your back on a firm bed or the floor with your legs stretched out. 2. Bring one knee to your chest. 3. Hold your knee to your chest by grabbing your knee or thigh. 4. Pull on your knee until you feel a gentle stretch in your lower back. 5. Keep doing the stretch for 10-30 seconds. 6. Slowly let go of your leg and straighten it. Pelvic Tilt Do these steps 5-10 times in a row: 1. Lie on your back on a firm bed or the floor with your legs stretched out. 2. Bend your knees so they point up to the ceiling. Your feet should be flat on the floor. 3. Tighten your lower belly (abdomen) muscles to press your lower back against the floor. This will make your tailbone point up to the ceiling instead of pointing down to your feet or the floor. 4. Stay in this position for 5-10 seconds while you gently tighten your muscles and breathe evenly. Cat-Cow Do these steps until your lower back bends more easily: 1. Get on your hands and knees on a firm surface. Keep your hands under your shoulders, and keep your knees under your hips. You may put padding under your knees. 2. Let your head hang down, and make your tailbone point down to the floor so your lower back is round like the back of a cat. 3. Stay in this position for 5 seconds. 4. Slowly lift your head and make your tailbone point up to the ceiling so your back hangs low (sags) like the back of a cow. 5. Stay in this position for 5 seconds.  Press-Ups Do these steps 5-10 times in a row: 1. Lie on your belly (face-down) on the  floor. 2. Place your hands near your head, about shoulder-width apart. 3. While you keep your back relaxed and keep your hips on the floor, slowly straighten your arms to raise the top half of your body and lift your shoulders. Do not use your back muscles. To make yourself more comfortable, you may change where you place your hands. 4. Stay in this position for 5 seconds. 5. Slowly return to lying flat on the floor.  Bridges Do these steps 10 times in a row: 1. Lie on your back on a firm surface. 2. Bend your knees so they point up to the ceiling. Your feet should be flat on the floor. 3. Tighten your butt muscles and lift your butt off of the floor until your waist is almost as high as your knees. If you do not feel the muscles working in your butt and the back of your thighs, slide your feet 1-2 inches farther away from your butt. 4. Stay in this position for 3-5 seconds. 5. Slowly lower your butt to the floor, and let your butt muscles relax. If this exercise is too easy, try doing it with your arms crossed over your chest. Belly Crunches Do these steps 5-10 times in a row: 1. Lie on your back on a  firm bed or the floor with your legs stretched out. 2. Bend your knees so they point up to the ceiling. Your feet should be flat on the floor. 3. Cross your arms over your chest. 4. Tip your chin a little bit toward your chest but do not bend your neck. 5. Tighten your belly muscles and slowly raise your chest just enough to lift your shoulder blades a tiny bit off of the floor. 6. Slowly lower your chest and your head to the floor. Back Lifts Do these steps 5-10 times in a row: 1. Lie on your belly (face-down) with your arms at your sides, and rest your forehead on the floor. 2. Tighten the muscles in your legs and your butt. 3. Slowly lift your chest off of the floor while you keep your hips on the floor. Keep the back of your head in line with the curve in your back. Look at the floor  while you do this. 4. Stay in this position for 3-5 seconds. 5. Slowly lower your chest and your face to the floor. Contact a doctor if:  Your back pain gets a lot worse when you do an exercise.  Your back pain does not lessen 2 hours after you exercise. If you have any of these problems, stop doing the exercises. Do not do them again unless your doctor says it is okay. Get help right away if:  You have sudden, very bad back pain. If this happens, stop doing the exercises. Do not do them again unless your doctor says it is okay. This information is not intended to replace advice given to you by your health care provider. Make sure you discuss any questions you have with your health care provider. Document Released: 11/09/2010 Document Revised: 07/01/2018 Document Reviewed: 12/01/2014 Elsevier Interactive Patient Education  2019 [employer]   Acute Back Pain, Adult Acute back pain is sudden and usually short-lived. It is often caused by an injury to the muscles and tissues in the back. The injury may result from:  A muscle or ligament getting overstretched or torn (strained). Ligaments are tissues that connect bones to each other. Lifting something improperly can cause a back strain.  Wear and tear (degeneration) of the spinal disks. Spinal disks are circular tissue that provides cushioning between the bones of the spine (vertebrae).  Twisting motions, such as while playing sports or doing yard work.  A hit to the back.  Arthritis. You may have a physical exam, lab tests, and imaging tests to find the cause of your pain. Acute back pain usually goes away with rest and home care. Follow these instructions at home: Managing pain, stiffness, and swelling  Take over-the-counter and prescription medicines only as told by your health care provider.  Your health care provider may recommend applying ice during the first 24-48 hours after your pain starts. To do this: ? Put ice in a  plastic bag. ? Place a towel between your skin and the bag. ? Leave the ice on for 20 minutes, 2-3 times a day.  If directed, apply heat to the affected area as often as told by your health care provider. Use the heat source that your health care provider recommends, such as a moist heat pack or a heating pad. ? Place a towel between your skin and the heat source. ? Leave the heat on for 20-30 minutes. ? Remove the heat if your skin turns bright red. This is especially important if you are unable to  feel pain, heat, or cold. You have a greater risk of getting burned. Activity   Do not stay in bed. Staying in bed for more than 1-2 days can delay your recovery.  Sit up and stand up straight. Avoid leaning forward when you sit, or hunching over when you stand. ? If you work at a desk, sit close to it so you do not need to lean over. Keep your chin tucked in. Keep your neck drawn back, and keep your elbows bent at a right angle. Your arms should look like the letter "L." ? Sit high and close to the steering wheel when you drive. Add lower back (lumbar) support to your car seat, if needed.  Take short walks on even surfaces as soon as you are able. Try to increase the length of time you walk each day.  Do not sit, drive, or stand in one place for more than 30 minutes at a time. Sitting or standing for long periods of time can put stress on your back.  Do not drive or use heavy machinery while taking prescription pain medicine.  Use proper lifting techniques. When you bend and lift, use positions that put less stress on your back: ? Bond your knees. ? Keep the load close to your body. ? Avoid twisting.  Exercise regularly as told by your health care provider. Exercising helps your back heal faster and helps prevent back injuries by keeping muscles strong and flexible.  Work with a physical therapist to make a safe exercise program, as recommended by your health care provider. Do any exercises  as told by your physical therapist. Lifestyle  Maintain a healthy weight. Extra weight puts stress on your back and makes it difficult to have good posture.  Avoid activities or situations that make you feel anxious or stressed. Stress and anxiety increase muscle tension and can make back pain worse. Learn ways to manage anxiety and stress, such as through exercise. General instructions  Sleep on a firm mattress in a comfortable position. Try lying on your side with your knees slightly bent. If you lie on your back, put a pillow under your knees.  Follow your treatment plan as told by your health care provider. This may include: ? Cognitive or behavioral therapy. ? Acupuncture or massage therapy. ? Meditation or yoga. Contact a health care provider if:  You have pain that is not relieved with rest or medicine.  You have increasing pain going down into your legs or buttocks.  Your pain does not improve after 2 weeks.  You have pain at night.  You lose weight without trying.  You have a fever or chills. Get help right away if:  You develop new bowel or bladder control problems.  You have unusual weakness or numbness in your arms or legs.  You develop nausea or vomiting.  You develop abdominal pain.  You feel faint. Summary  Acute back pain is sudden and usually short-lived.  Use proper lifting techniques. When you bend and lift, use positions that put less stress on your back.  Take over-the-counter and prescription medicines and apply heat or ice as directed by your health care provider. This information is not intended to replace advice given to you by your health care provider. Make sure you discuss any questions you have with your health care provider. Document Released: 10/07/2005 Document Revised: 05/14/2018 Document Reviewed: 05/21/2017 Elsevier Interactive Patient Education  2019 [employer].

## 2018-12-18 NOTE — Discharge Summary (Signed)
[place] [facility] [place] NAME: Kathy [name]    MR#:  [id number]  DATE OF BIRTH:  Feb 28, 1946  DATE OF ADMISSION:  12/14/2018 ADMITTING PHYSICIAN: Lance Coon, MD  DATE OF DISCHARGE: 12/18/2018  PRIMARY CARE PHYSICIAN: Clarisse Gouge, MD    ADMISSION DIAGNOSIS:  Urinary tract infection without hematuria, site unspecified [N39.0] Back pain, unspecified back location, unspecified back pain laterality, unspecified chronicity [M54.9]  DISCHARGE DIAGNOSIS:  Principal Problem:   Acute lower UTI Active Problems:   Compression fracture of lumbosacral spine (HCC)   COPD (chronic obstructive pulmonary disease) (HCC)   Schizophrenia (HCC)   UTI (urinary tract infection)   SECONDARY DIAGNOSIS:   Past Medical History:  Diagnosis Date  . Arthritis    hips  . B12 deficiency   . Colon cancer ([place]) 07/06/2018  . 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 COPD (chronic obstructive pulmonary disease) ([place])   . Fibrocystic breast disease   . Mediastinal mass    removed 1/16  . Motion sickness    fair rides  . Osteoporosis   . Schizophrenia ([place])   . Vitamin D deficiency   . Wears dentures    partial upper and lower    HOSPITAL COURSE:  73 year old female with history of COPD who presented to the emergency room due to back pain.   1.  Back pain due to subacute fractures involving L3 and L4 with b/l sacral fractures POD #1 kyphoplasty Neurosurgery has recommended LSO brace Follow-up with neurosurgery in 2 weeks Continue pain managementPRN  2.  Urinary tract infection: Urine culture suggests recollection She was given antibiotics for 3 days and has completed course.  3.  COPD without signs of exacerbation   4.  Hypokalemia: Repleted  DISCHARGE CONDITIONS AND DIET:   Stable on regular diet  CONSULTS OBTAINED:  Treatment Team:  Lequita Asal, MD Hessie Knows, MD  DRUG ALLERGIES:   Allergies  Allergen Reactions  . Biaxin  [Clarithromycin] Shortness Of Breath  . Levofloxacin     Muscle pain  . Penicillins Rash    Has patient had a PCN reaction causing immediate rash, facial/tongue/throat swelling, SOB or lightheadedness with hypotension: no Has patient had a PCN reaction causing severe rash involving mucus membranes or skin necrosis: no Has patient had a PCN reaction that required hospitalization: no Has patient had a PCN reaction occurring within the last 10 years: no If all of the above answers are "NO", then may proceed with Cephalosporin use.     DISCHARGE MEDICATIONS:   Allergies as of 12/18/2018      Reactions   Biaxin [clarithromycin] Shortness Of Breath   Levofloxacin    Muscle pain   Penicillins Rash   Has patient had a PCN reaction causing immediate rash, facial/tongue/throat swelling, SOB or lightheadedness with hypotension: no Has patient had a PCN reaction causing severe rash involving mucus membranes or skin necrosis: no Has patient had a PCN reaction that required hospitalization: no Has patient had a PCN reaction occurring within the last 10 years: no If all of the above answers are "NO", then may proceed with Cephalosporin use.      Medication List    STOP taking these medications   predniSONE 5 MG tablet Commonly known as:  DELTASONE Replaced by:  predniSONE 10 MG (21) Tbpk tablet     TAKE these medications   ASPIRIN 81 PO Take 81 mg by mouth daily.   conjugated estrogens vaginal cream Commonly  known as:  PREMARIN Place 1 Applicatorful vaginally 2 (two) times a week.   lidocaine-prilocaine cream Commonly known as:  EMLA APPLY EXTERNALLY TO THE AFFECTED AREA 1 TIME   magic mouthwash w/lidocaine Soln Take 10 mLs by mouth 3 (three) times daily as needed. What changed:  when to take this   oxyCODONE-acetaminophen 5-325 MG tablet Commonly known as:  PERCOCET/ROXICET Take 1-2 tablets by mouth every 4 (four) hours as needed for moderate pain or severe pain.    polyethylene glycol packet Commonly known as:  MIRALAX / GLYCOLAX Take 17 g by mouth daily as needed.   Potassium Gluconate 550 MG Tabs Take 1 tablet by mouth daily.   predniSONE 10 MG (21) Tbpk tablet Commonly known as:  STERAPRED UNI-PAK 21 TAB 40 mg PO (ORAL)  x 2 days 30 mg PO  (ORAL)  x 2 days 20 mg PO  (ORAL) x 2 days 10 mg PO  (ORAL) x 2 days then stop Replaces:  predniSONE 5 MG tablet   PROCYCLIDINE HCL PO Take 5 mg by mouth 3 (three) times daily. (Kemadrine)   senna 8.6 MG tablet Commonly known as:  SENOKOT Take 1 tablet by mouth daily as needed.   thiothixene 5 MG capsule Commonly known as:  NAVANE Take 10 mg by mouth at bedtime.   vitamin B-12 1000 MCG tablet Commonly known as:  CYANOCOBALAMIN Take 1,000 mcg by mouth daily.   Vitamin D3 50 MCG (2000 UT) Tabs Take 2,000 Units by mouth daily.         Today   CHIEF COMPLAINT:  Continues to have some sacral pain Started on IV steroids by Dr. Meliton Rattan is at bedside   VITAL SIGNS:  Blood pressure 127/64, pulse (!) 112, temperature 97.9 F (36.6 C), temperature source Oral, resp. rate 19, height 5\' 4"  (1.626 m), weight 70 kg, SpO2 90 %.   REVIEW OF SYSTEMS:  Review of Systems  Constitutional: Negative.  Negative for chills, fever and malaise/fatigue.  HENT: Negative.  Negative for ear discharge, ear pain, hearing loss, nosebleeds and sore throat.   Eyes: Negative.  Negative for blurred vision and pain.  Respiratory: Negative.  Negative for cough, hemoptysis, shortness of breath and wheezing.   Cardiovascular: Negative.  Negative for chest pain, palpitations and leg swelling.  Gastrointestinal: Negative.  Negative for abdominal pain, blood in stool, diarrhea, nausea and vomiting.  Genitourinary: Negative.  Negative for dysuria.  Musculoskeletal: Negative for back pain.       Sacral pain  Skin: Negative.   Neurological: Negative for dizziness, tremors, speech change, focal weakness, seizures and  headaches.  Endo/Heme/Allergies: Negative.  Does not bruise/bleed easily.  Psychiatric/Behavioral: Negative.  Negative for depression, hallucinations and suicidal ideas.     PHYSICAL EXAMINATION:  GENERAL:  73 y.o.-year-old patient lying in the bed with no acute distress.  NECK:  Supple, no jugular venous distention. No thyroid enlargement, no tenderness.  LUNGS: Normal breath sounds bilaterally, no wheezing, rales,rhonchi  No use of accessory muscles of respiration.  CARDIOVASCULAR: rr//r No murmurs, rubs, or gallops.  ABDOMEN: Soft, non-tender, non-distended. Bowel sounds present. No organomegaly or mass.  EXTREMITIES: No pedal edema, cyanosis, or clubbing.  PSYCHIATRIC: The patient is alert and oriented x 3.  SKIN: No obvious rash, lesion, or ulcer.   DATA REVIEW:   CBC Recent Labs  Lab 12/15/18 0504  WBC 11.4*  HGB 11.6*  HCT 36.5  PLT 283    Chemistries  Recent Labs  Lab 12/14/18 1646 12/15/18  0504  NA 138 142  K 4.0 3.4*  CL 104 111  CO2 25 26  GLUCOSE 133* 91  BUN 23 17  CREATININE 0.51 0.50  CALCIUM 9.3 9.0  MG  --  2.0  AST 24  --   ALT 22  --   ALKPHOS 172*  --   BILITOT 0.6  --     Cardiac Enzymes No results for input(s): TROPONINI in the last 168 hours.  Microbiology Results  @MICRORSLT48 @  RADIOLOGY:  Dg Lumbar Spine 2-3 Views  Result Date: 12/17/2018 CLINICAL DATA:  L3, L4 and sacral kyphoplasty. EXAM: DG C-ARM 61-120 MIN; LUMBAR SPINE - 2-3 VIEW COMPARISON:  None. FINDINGS: Spot films of the lumbosacral spine are submitted postoperatively for interpretation. Augmentation changes within what appears to be L3, L4 and bilateral sacrum noted. IMPRESSION: Augmentation changes as described. Electronically Signed   By: Margarette Canada M.D.   On: 12/17/2018 14:25   Dg C-arm 1-60 Min  Result Date: 12/17/2018 CLINICAL DATA:  L3, L4 and sacral kyphoplasty. EXAM: DG C-ARM 61-120 MIN; LUMBAR SPINE - 2-3 VIEW COMPARISON:  None. FINDINGS: Spot films of the  lumbosacral spine are submitted postoperatively for interpretation. Augmentation changes within what appears to be L3, L4 and bilateral sacrum noted. IMPRESSION: Augmentation changes as described. Electronically Signed   By: Margarette Canada M.D.   On: 12/17/2018 14:25      Allergies as of 12/18/2018      Reactions   Biaxin [clarithromycin] Shortness Of Breath   Levofloxacin    Muscle pain   Penicillins Rash   Has patient had a PCN reaction causing immediate rash, facial/tongue/throat swelling, SOB or lightheadedness with hypotension: no Has patient had a PCN reaction causing severe rash involving mucus membranes or skin necrosis: no Has patient had a PCN reaction that required hospitalization: no Has patient had a PCN reaction occurring within the last 10 years: no If all of the above answers are "NO", then may proceed with Cephalosporin use.      Medication List    STOP taking these medications   predniSONE 5 MG tablet Commonly known as:  DELTASONE Replaced by:  predniSONE 10 MG (21) Tbpk tablet     TAKE these medications   ASPIRIN 81 PO Take 81 mg by mouth daily.   conjugated estrogens vaginal cream Commonly known as:  PREMARIN Place 1 Applicatorful vaginally 2 (two) times a week.   lidocaine-prilocaine cream Commonly known as:  EMLA APPLY EXTERNALLY TO THE AFFECTED AREA 1 TIME   magic mouthwash w/lidocaine Soln Take 10 mLs by mouth 3 (three) times daily as needed. What changed:  when to take this   oxyCODONE-acetaminophen 5-325 MG tablet Commonly known as:  PERCOCET/ROXICET Take 1-2 tablets by mouth every 4 (four) hours as needed for moderate pain or severe pain.   polyethylene glycol packet Commonly known as:  MIRALAX / GLYCOLAX Take 17 g by mouth daily as needed.   Potassium Gluconate 550 MG Tabs Take 1 tablet by mouth daily.   predniSONE 10 MG (21) Tbpk tablet Commonly known as:  STERAPRED UNI-PAK 21 TAB 40 mg PO (ORAL)  x 2 days 30 mg PO  (ORAL)  x 2 days 20  mg PO  (ORAL) x 2 days 10 mg PO  (ORAL) x 2 days then stop Replaces:  predniSONE 5 MG tablet   PROCYCLIDINE HCL PO Take 5 mg by mouth 3 (three) times daily. (Kemadrine)   senna 8.6 MG tablet Commonly known as:  [place] [employer]  Take 1 tablet by mouth daily as needed.   thiothixene 5 MG capsule Commonly known as:  NAVANE Take 10 mg by mouth at bedtime.   vitamin B-12 1000 MCG tablet Commonly known as:  CYANOCOBALAMIN Take 1,000 mcg by mouth daily.   Vitamin D3 50 MCG (2000 UT) Tabs Take 2,000 Units by mouth daily.         Management plans discussed with the patient and she is in agreement. Stable for discharge   Patient should follow up with ortho  CODE STATUS:     Code Status Orders  (From admission, onward)         Start     Ordered   12/15/18 0258  Full code  Continuous     12/15/18 0258        Code Status History    Date Active Date Inactive Code Status Order ID Comments User Context   07/06/2018 1809 07/09/2018 1259 Full Code [id number]  Benjamine Sprague, DO Inpatient   01/15/2017 1318 01/15/2017 1650 Full Code [id number]  Samara Deist, [facility] Inpatient   11/20/2016 1554 11/20/2016 1930 Full Code [id number]  Samara Deist, DPM Inpatient    Advance Directive Documentation     Most Recent Value  Type of Advance Directive  Healthcare Power of Attorney, Living will  Pre-existing out of facility DNR order (yellow form or pink MOST form)  -  "MOST" Form in Place?  -      TOTAL TIME TAKING CARE OF THIS PATIENT: 38 minutes.    Note: This dictation was prepared with Dragon dictation along with smaller phrase technology. Any transcriptional errors that result from this process are unintentional.  Marketia Stallsmith M.D on 12/18/2018 at 9:39 AM  Between 7am to 6pm - Pager - [phone] After 6pm go to [email or web address] - password [email or web address] [facility]  Office  [phone]  CC: Primary care physician; Clarisse Gouge, MD

## 2018-12-18 NOTE — Clinical Social Work Note (Signed)
CSW received phone call from patient's daughter Lacie Draft [phone]. Erasmo Downer states that family would like patient to go to [facility]. CSW notified Claiborne Billings at [facility] of bed acceptance. CSW also started [facility]. CSW will continue to follow for discharge planning.   [place], [place]

## 2018-12-18 NOTE — Progress Notes (Signed)
[place] at [place] NAME: Kathy [name]    MR#:  [id number]  DATE OF BIRTH:  05/24/1946  SUBJECTIVE:  Patient having bilateral sacral pain from her fractures.  Postoperative day #1 kyphoplasty. Son is at bedside  REVIEW OF SYSTEMS:    Review of Systems  Constitutional: Negative for fever, chills weight loss HENT: Negative for ear pain, nosebleeds, congestion, facial swelling, rhinorrhea, neck pain, neck stiffness and ear discharge.   Respiratory: Negative for cough, shortness of breath, wheezing  Cardiovascular: Negative for chest pain, palpitations and leg swelling.  Gastrointestinal: Negative for heartburn, abdominal pain, vomiting, diarrhea or consitpation Genitourinary: Negative for dysuria, urgency, frequency, hematuria Musculoskeletal: Sacral pain  neurological: Negative for dizziness, seizures, syncope, focal weakness,  numbness and headaches.  Hematological: Does not bruise/bleed easily.  Psychiatric/Behavioral: Negative for hallucinations, confusion, dysphoric mood    Tolerating Diet: Yes    DRUG ALLERGIES:   Allergies  Allergen Reactions  . Biaxin [Clarithromycin] Shortness Of Breath  . Levofloxacin     Muscle pain  . Penicillins Rash    Has patient had a PCN reaction causing immediate rash, facial/tongue/throat swelling, SOB or lightheadedness with hypotension: no Has patient had a PCN reaction causing severe rash involving mucus membranes or skin necrosis: no Has patient had a PCN reaction that required hospitalization: no Has patient had a PCN reaction occurring within the last 10 years: no If all of the above answers are "NO", then may proceed with Cephalosporin use.     VITALS:  Blood pressure 127/64, pulse (!) 112, temperature 97.9 F (36.6 C), temperature source Oral, resp. rate 19, height 5\' 4"  (1.626 m), weight 70 kg, SpO2 90 %.  PHYSICAL EXAMINATION:  Constitutional: Appears well-developed and  well-nourished. No distress. HENT: Normocephalic. Marland Kitchen Oropharynx is clear and moist.  Eyes: Conjunctivae and EOM are normal. PERRLA, no scleral icterus.  Neck: Normal ROM. Neck supple. No JVD. No tracheal deviation. CVS: RRR, S1/S2 +, no murmurs, no gallops, no carotid bruit.  Pulmonary: Effort and breath sounds normal, no stridor, rhonchi, wheezes, rales.  Abdominal: Soft. BS +,  no distension, tenderness, rebound or guarding.  Musculoskeletal: Normal range of motion. No edema and no tenderness.  Neuro: Alert. CN 2-12 grossly intact. No focal deficits. Skin: Skin is warm and dry. No rash noted. Psychiatric: Normal mood and affect.      LABORATORY PANEL:   CBC Recent Labs  Lab 12/15/18 0504  WBC 11.4*  HGB 11.6*  HCT 36.5  PLT 283   ------------------------------------------------------------------------------------------------------------------  Chemistries  Recent Labs  Lab 12/14/18 1646 12/15/18 0504  NA 138 142  K 4.0 3.4*  CL 104 111  CO2 25 26  GLUCOSE 133* 91  BUN 23 17  CREATININE 0.51 0.50  CALCIUM 9.3 9.0  MG  --  2.0  AST 24  --   ALT 22  --   ALKPHOS 172*  --   BILITOT 0.6  --    ------------------------------------------------------------------------------------------------------------------  Cardiac Enzymes No results for input(s): TROPONINI in the last 168 hours. ------------------------------------------------------------------------------------------------------------------  RADIOLOGY:  Dg Lumbar Spine 2-3 Views  Result Date: 12/17/2018 CLINICAL DATA:  L3, L4 and sacral kyphoplasty. EXAM: DG C-ARM 61-120 MIN; LUMBAR SPINE - 2-3 VIEW COMPARISON:  None. FINDINGS: Spot films of the lumbosacral spine are submitted postoperatively for interpretation. Augmentation changes within what appears to be L3, L4 and bilateral sacrum noted. IMPRESSION: Augmentation changes as described. Electronically Signed   By: Cleatis Polka.D.  On: 12/17/2018 14:25   Dg  C-arm 1-60 Min  Result Date: 12/17/2018 CLINICAL DATA:  L3, L4 and sacral kyphoplasty. EXAM: DG C-ARM 61-120 MIN; LUMBAR SPINE - 2-3 VIEW COMPARISON:  None. FINDINGS: Spot films of the lumbosacral spine are submitted postoperatively for interpretation. Augmentation changes within what appears to be L3, L4 and bilateral sacrum noted. IMPRESSION: Augmentation changes as described. Electronically Signed   By: Margarette Canada M.D.   On: 12/17/2018 14:25     ASSESSMENT AND PLAN:    73 year old female with history of COPD who presented to the emergency room due to back pain.   1. Back pain due tosubacute fractures involving L3 and L4 with b/l sacral fractures POD #1 kyphoplasty Neurosurgery has recommended LSO brace Follow-up with neurosurgery in 2 weeks Continue pain managementPRN  2. Urinary tract infection: Urine culture suggests recollection She was given antibiotics for 3 days and has completed course.  3. COPD without signs of exacerbation   4. Hypokalemia: Replete PRN     Awaiting PASSR  for skilled nursing facility placement Management plans discussed with the patient and son and they are in agreement.  D/w csw   CODE STATUS: FULL  TOTAL TIME TAKING CARE OF THIS PATIENT: 24 minutes.     POSSIBLE D/C tomorrow, DEPENDING ON CLINICAL CONDITION.   Naiomi Musto M.D on 12/18/2018 at 11:12 AM  Between 7am to 6pm - Pager - [phone] After 6pm go to [email or web address] - password [email or web address] [facility]  Office  [phone]  CC: Primary care physician; Clarisse Gouge, MD  Note: This dictation was prepared with Dragon dictation along with smaller phrase technology. Any transcriptional errors that result from this process are unintentional.

## 2018-12-18 NOTE — Evaluation (Signed)
Physical Therapy Evaluation Patient Details Name: Kathy [name] MRN: [id number] DOB: 26-Mar-1946 Today's Date: 12/18/2018   History of Present Illness  Pt is a 73 year old female admitted with UTI and L3/4 endplate fracture. MRI indicates bilateral sacral ala, right L5 transverse process fracture, and right pubic symphysis fracture. Pt reports having numbness and tingling prior to surgery. Kyphoplasty of L3/L4 on 2/27. Per conversation with Kathy [name] prior to PT eval no brace needed. PMH includes COPD, colon CA (last chemo 11/23/2018) and osteoporosis.    Clinical Impression  Kathy [name] admitted for the above and presents with the following deficits listed below (see PT problem list). SPT min guard for all aspects of mobility. Mild unsteadiness with gait observed due to narrow BOS but no LOB. Pt requested to sit at 30 ft due to back pain and fatigue. SpO2 dropped to 89% towards end of gait and educated in pursed lip breathing, other remained at or above 91%. SPT recommending SNF at discharge due to unsteadiness on feet, muscle weakness, balance impairments, and decreased support at home.     Follow Up Recommendations SNF;Supervision for mobility/OOB    Equipment Recommendations  Other (comment)(TBD at next venue )    Recommendations for Other Services       Precautions / Restrictions Precautions Precautions: Back;Fall Precaution Booklet Issued: No Precaution Comments: Pt educated on no bending, lifting, twisting. Monitor HR, O2. Restrictions Weight Bearing Restrictions: No      Mobility  Bed Mobility Overal bed mobility: Needs Assistance Bed Mobility: Supine to Sit     Supine to sit: Min guard     General bed mobility comments: Pt instructed in log roll and SPT min guard for safety. Pt able to perform log roll correctly.  Transfers Overall transfer level: Needs assistance Equipment used: Rolling walker (2 wheeled) Transfers: Sit to/from Stand Sit to Stand: Min guard          General transfer comment: SPT min guard for STS. no unsteadiness noted.   Ambulation/Gait Ambulation/Gait assistance: Min guard Gait Distance (Feet): 30 Feet Assistive device: Rolling walker (2 wheeled) Gait Pattern/deviations: Step-through pattern;Decreased step length - right;Decreased step length - left;Trunk flexed;Narrow base of support Gait velocity: decreased    General Gait Details: Pt tended to let RW get too far ahead of her, SPT cued on proper management. Chair follow for safety. Mild unsteadiness noted due to narrow BOS but no LOB observed.   Stairs            Wheelchair Mobility    Modified Rankin (Stroke Patients Only)       Balance Overall balance assessment: Needs assistance Sitting-balance support: Bilateral upper extremity supported;Feet supported Sitting balance-Leahy Scale: Poor Sitting balance - Comments: pt requires BUE support to maintain balance.   Standing balance support: Bilateral upper extremity supported;During functional activity Standing balance-Leahy Scale: Poor Standing balance comment: BUE support or RW required to maintain static and dynamic balance.                             Pertinent Vitals/Pain Pain Assessment: Faces Faces Pain Scale: Hurts little more Pain Location: back Pain Descriptors / Indicators: Discomfort;Guarding;Sore Pain Intervention(s): Limited activity within patient's tolerance;Monitored during session;Premedicated before session;Repositioned    Home Living Family/patient expects to be discharged to:: Private residence Living Arrangements: Children Available Help at Discharge: Family;Available PRN/intermittently Type of Home: House Home Access: Stairs to enter Entrance Stairs-Rails: None(none outside, cant reach both inside )  Entrance Stairs-Number of Steps: 2 in garage, 4 in front  Home Layout: Two level;1/2 bath on main level;Other (Comment)(pt able to sleep on first floor, shower on second) Home  Equipment: Walker - 2 wheels;Walker - 4 wheels;Cane - quad;Bedside commode;Shower seat;Grab bars - tub/shower;Hand held shower head      Prior Function Level of Independence: Needs assistance   Gait / Transfers Assistance Needed: Pt reports PTA she was walking 46min-1hr on treadmil. Pt recently started using AD when her back began hurting. Denies any falls in the past 6 months.   ADL's / Homemaking Assistance Needed: PTA pt independent with cooking, bathing, and dressing, her son assisted her with cleaning.         Hand Dominance   Dominant Hand: Right    Extremity/Trunk Assessment   Upper Extremity Assessment Upper Extremity Assessment: Generalized weakness    Lower Extremity Assessment Lower Extremity Assessment: RLE deficits/detail;LLE deficits/detail RLE Deficits / Details: Strength grossly 3-/5. Pt unable to perform SLR through full ROM.  RLE Sensation: WNL RLE Coordination: WNL LLE Deficits / Details: Strength grossly 3-/5. Pt unable to perform SLR through full ROM. Pt slightly stronger L>R. LLE Sensation: WNL LLE Coordination: WNL    Cervical / Trunk Assessment Cervical / Trunk Assessment: Kyphotic  Communication   Communication: No difficulties  Cognition Arousal/Alertness: Awake/alert Behavior During Therapy: WFL for tasks assessed/performed Overall Cognitive Status: Within Functional Limits for tasks assessed                                 General Comments: AxO x4. Pt responded appropriately to questions.      General Comments General comments (skin integrity, edema, etc.): Pt on RA throughout. SpO2 dropped to 89% towards end of gait, pt instructed on pursed lip breathing in which SpO2 rose to 91%. HR in supine at rest 88 and up to 105 with activity. Pt reports some numbness in her buttocks area but that it is the same feeling that she had prior to surgery. Pt states she has had issues with bladder function but attributes lack of control due to  back pain and inability to make it to restroom in timely fashion, pt however denies saddle paraesthesia or inability to feel while wiping.     Exercises Other Exercises Other Exercises: pt educated in no bending, lifting, twisting.  Other Exercises: pt educated in and demonstrated proper log roll technique for bed mobility to adhere to precautions.    Assessment/Plan    PT Assessment Patient needs continued PT services  PT Problem List Decreased strength;Decreased range of motion;Decreased activity tolerance;Decreased balance;Decreased mobility;Decreased knowledge of use of DME;Decreased safety awareness;Decreased knowledge of precautions;Pain       PT Treatment Interventions DME instruction;Gait training;Stair training;Functional mobility training;Therapeutic activities;Therapeutic exercise;Balance training;Neuromuscular re-education;Patient/family education;Wheelchair mobility training;Modalities    PT Goals (Current goals can be found in the Care Plan section)  Acute Rehab PT Goals Patient Stated Goal: to go home  PT Goal Formulation: With patient Time For Goal Achievement: 01/01/19 Potential to Achieve Goals: Good Additional Goals Additional Goal #1: Pt will properly communicate and adhere to back precautions.    Frequency Min 2X/week   Barriers to discharge Decreased caregiver support Pt lives with son who still works and is only able to provide intermittent care.    Co-evaluation               AM-PAC PT "6 Clicks" Mobility  Outcome Measure  Help needed turning from your back to your side while in a flat bed without using bedrails?: A Little Help needed moving from lying on your back to sitting on the side of a flat bed without using bedrails?: A Little Help needed moving to and from a bed to a chair (including a wheelchair)?: A Little Help needed standing up from a chair using your arms (e.g., wheelchair or bedside chair)?: A Little Help needed to walk in hospital  room?: A Little Help needed climbing 3-5 steps with a railing? : A Lot 6 Click Score: 17    End of Session Equipment Utilized During Treatment: Gait belt Activity Tolerance: Patient limited by pain;Patient tolerated treatment well;Patient limited by fatigue Patient left: in chair;with call bell/phone within reach;with chair alarm set Nurse Communication: Mobility status PT Visit Diagnosis: Unsteadiness on feet (R26.81);Other abnormalities of gait and mobility (R26.89);Muscle weakness (generalized) (M62.81);Pain Pain - part of body: (back)    Time: 1119-1140 PT Time Calculation (min) (ACUTE ONLY): 21 min   Charges:   PT Evaluation $PT Eval Low Complexity: 1 Low PT Treatments $Gait Training: 8-22 mins        Dorothy Spark, SPT  12/18/2018, 12:58 PM

## 2018-12-18 NOTE — Progress Notes (Signed)
Still having quite a bit of buttock pain, will give some IV solumedrol and see if that helps with inflammation.

## 2018-12-19 LAB — CREATININE, SERUM
Creatinine, Ser: 0.59 mg/dL (ref 0.44–1.00)
GFR calc Af Amer: >60 mL/min (ref >60)
GFR calc non Af Amer: >60 mL/min (ref >60)

## 2018-12-19 NOTE — Progress Notes (Signed)
Pt d/c to [facility] via daughter. Port deaccessed. Education completed. Report given to RN at [facility]. Pt will be going to bed 5B.

## 2018-12-19 NOTE — Clinical Social Work Note (Signed)
Patient is medically ready for discharge today to [facility] center. CSW notified patient and daughter at bedside of discharge today. CSW also notified Claiborne Billings at [facility] of discharge. CSW faxed DC information to facility. Patient will be transported by family. RN will call report to facility.   [place], [place]

## 2018-12-19 NOTE — Discharge Summary (Signed)
[facility] [place] NAME: Kathy [name]    MR#:  [id number]  DATE OF BIRTH:  November 01, 1945  DATE OF ADMISSION:  12/14/2018 ADMITTING PHYSICIAN: Lance Coon, MD  DATE OF DISCHARGE: 12/18/2018  PRIMARY CARE PHYSICIAN: Clarisse Gouge, MD    ADMISSION DIAGNOSIS:  Urinary tract infection without hematuria, site unspecified [N39.0] Back pain, unspecified back location, unspecified back pain laterality, unspecified chronicity [M54.9]  DISCHARGE DIAGNOSIS:  Principal Problem:   Acute lower UTI Active Problems:   Compression fracture of lumbosacral spine (HCC)   COPD (chronic obstructive pulmonary disease) (HCC)   Schizophrenia (HCC)   UTI (urinary tract infection)   SECONDARY DIAGNOSIS:   Past Medical History:  Diagnosis Date  . Arthritis    hips  . B12 deficiency   . Colon cancer ([place]) 07/06/2018  . 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 COPD (chronic obstructive pulmonary disease) ([place])   . Fibrocystic breast disease   . Mediastinal mass    removed 1/16  . Motion sickness    fair rides  . Osteoporosis   . Schizophrenia ([place])   . Vitamin D deficiency   . Wears dentures    partial upper and lower    HOSPITAL COURSE:  73 year old female with history of COPD who presented to the emergency room due to back pain.   1.  Back pain due to subacute fractures involving L3 and L4 with b/l sacral fractures POD #2 kyphoplasty Neurosurgery has recommended LSO brace Follow-up with neurosurgery in 2 weeks Continue pain managementPRN  2.  Urinary tract infection: Urine culture suggests recollection She was given antibiotics for 3 days and has completed course.  3.  COPD without signs of exacerbation   4.  Hypokalemia: Repleted  DISCHARGE CONDITIONS AND DIET:   Stable on regular diet  CONSULTS OBTAINED:  Treatment Team:  Lequita Asal, MD Hessie Knows, MD  DRUG ALLERGIES:   Allergies  Allergen Reactions  . Biaxin  [Clarithromycin] Shortness Of Breath  . Levofloxacin     Muscle pain  . Penicillins Rash    Has patient had a PCN reaction causing immediate rash, facial/tongue/throat swelling, SOB or lightheadedness with hypotension: no Has patient had a PCN reaction causing severe rash involving mucus membranes or skin necrosis: no Has patient had a PCN reaction that required hospitalization: no Has patient had a PCN reaction occurring within the last 10 years: no If all of the above answers are "NO", then may proceed with Cephalosporin use.     DISCHARGE MEDICATIONS:   Allergies as of 12/19/2018      Reactions   Biaxin [clarithromycin] Shortness Of Breath   Levofloxacin    Muscle pain   Penicillins Rash   Has patient had a PCN reaction causing immediate rash, facial/tongue/throat swelling, SOB or lightheadedness with hypotension: no Has patient had a PCN reaction causing severe rash involving mucus membranes or skin necrosis: no Has patient had a PCN reaction that required hospitalization: no Has patient had a PCN reaction occurring within the last 10 years: no If all of the above answers are "NO", then may proceed with Cephalosporin use.      Medication List    STOP taking these medications   predniSONE 5 MG tablet Commonly known as:  DELTASONE Replaced by:  predniSONE 10 MG (21) Tbpk tablet     TAKE these medications   ASPIRIN 81 PO Take 81 mg by mouth daily.   conjugated estrogens vaginal cream Commonly  known as:  PREMARIN Place 1 Applicatorful vaginally 2 (two) times a week.   lidocaine-prilocaine cream Commonly known as:  EMLA APPLY EXTERNALLY TO THE AFFECTED AREA 1 TIME   magic mouthwash w/lidocaine Soln Take 10 mLs by mouth 3 (three) times daily as needed. What changed:  when to take this   oxyCODONE-acetaminophen 5-325 MG tablet Commonly known as:  PERCOCET/ROXICET Take 1-2 tablets by mouth every 4 (four) hours as needed for moderate pain or severe pain.    polyethylene glycol packet Commonly known as:  MIRALAX / GLYCOLAX Take 17 g by mouth daily as needed.   Potassium Gluconate 550 MG Tabs Take 1 tablet by mouth daily.   predniSONE 10 MG (21) Tbpk tablet Commonly known as:  STERAPRED UNI-PAK 21 TAB 40 mg PO (ORAL)  x 2 days 30 mg PO  (ORAL)  x 2 days 20 mg PO  (ORAL) x 2 days 10 mg PO  (ORAL) x 2 days then stop Replaces:  predniSONE 5 MG tablet   PROCYCLIDINE HCL PO Take 5 mg by mouth 3 (three) times daily. (Kemadrine)   senna 8.6 MG tablet Commonly known as:  SENOKOT Take 1 tablet by mouth daily as needed.   thiothixene 5 MG capsule Commonly known as:  NAVANE Take 10 mg by mouth at bedtime.   vitamin B-12 1000 MCG tablet Commonly known as:  CYANOCOBALAMIN Take 1,000 mcg by mouth daily.   Vitamin D3 50 MCG (2000 UT) Tabs Take 2,000 Units by mouth daily.         Today   CHIEF COMPLAINT:  No pain, got up from bed and sat in chair.   VITAL SIGNS:  Blood pressure 128/74, pulse 91, temperature (!) 97.5 F (36.4 C), temperature source Oral, resp. rate 20, height 5\' 4"  (1.626 m), weight 70 kg, SpO2 94 %.   REVIEW OF SYSTEMS:  Review of Systems  Constitutional: Negative.  Negative for chills, fever and malaise/fatigue.  HENT: Negative.  Negative for ear discharge, ear pain, hearing loss, nosebleeds and sore throat.   Eyes: Negative.  Negative for blurred vision and pain.  Respiratory: Negative.  Negative for cough, hemoptysis, shortness of breath and wheezing.   Cardiovascular: Negative.  Negative for chest pain, palpitations and leg swelling.  Gastrointestinal: Negative.  Negative for abdominal pain, blood in stool, diarrhea, nausea and vomiting.  Genitourinary: Negative.  Negative for dysuria.  Musculoskeletal: Negative for back pain.       Sacral pain  Skin: Negative.   Neurological: Negative for dizziness, tremors, speech change, focal weakness, seizures and headaches.  Endo/Heme/Allergies: Negative.  Does  not bruise/bleed easily.  Psychiatric/Behavioral: Negative.  Negative for depression, hallucinations and suicidal ideas.     PHYSICAL EXAMINATION:  GENERAL:  73 y.o.-year-old patient lying in the bed with no acute distress.  NECK:  Supple, no jugular venous distention. No thyroid enlargement, no tenderness.  LUNGS: Normal breath sounds bilaterally, no wheezing, rales,rhonchi  No use of accessory muscles of respiration.  CARDIOVASCULAR: rr//r No murmurs, rubs, or gallops.  ABDOMEN: Soft, non-tender, non-distended. Bowel sounds present. No organomegaly or mass.  EXTREMITIES: No pedal edema, cyanosis, or clubbing.  PSYCHIATRIC: The patient is alert and oriented x 3.  SKIN: No obvious rash, lesion, or ulcer.   DATA REVIEW:   CBC Recent Labs  Lab 12/15/18 0504  WBC 11.4*  HGB 11.6*  HCT 36.5  PLT 283    Chemistries  Recent Labs  Lab 12/14/18 1646 12/15/18 0504 12/19/18 0525  NA 138 142  --  K 4.0 3.4*  --   CL 104 111  --   CO2 25 26  --   GLUCOSE 133* 91  --   BUN 23 17  --   CREATININE 0.51 0.50 0.59  CALCIUM 9.3 9.0  --   MG  --  2.0  --   AST 24  --   --   ALT 22  --   --   ALKPHOS 172*  --   --   BILITOT 0.6  --   --     Cardiac Enzymes No results for input(s): TROPONINI in the last 168 hours.  Microbiology Results  @MICRORSLT48 @  RADIOLOGY:  Dg Lumbar Spine 2-3 Views  Result Date: 12/17/2018 CLINICAL DATA:  L3, L4 and sacral kyphoplasty. EXAM: DG C-ARM 61-120 MIN; LUMBAR SPINE - 2-3 VIEW COMPARISON:  None. FINDINGS: Spot films of the lumbosacral spine are submitted postoperatively for interpretation. Augmentation changes within what appears to be L3, L4 and bilateral sacrum noted. IMPRESSION: Augmentation changes as described. Electronically Signed   By: Margarette Canada M.D.   On: 12/17/2018 14:25   Dg C-arm 1-60 Min  Result Date: 12/17/2018 CLINICAL DATA:  L3, L4 and sacral kyphoplasty. EXAM: DG C-ARM 61-120 MIN; LUMBAR SPINE - 2-3 VIEW COMPARISON:  None.  FINDINGS: Spot films of the lumbosacral spine are submitted postoperatively for interpretation. Augmentation changes within what appears to be L3, L4 and bilateral sacrum noted. IMPRESSION: Augmentation changes as described. Electronically Signed   By: Margarette Canada M.D.   On: 12/17/2018 14:25      Allergies as of 12/19/2018      Reactions   Biaxin [clarithromycin] Shortness Of Breath   Levofloxacin    Muscle pain   Penicillins Rash   Has patient had a PCN reaction causing immediate rash, facial/tongue/throat swelling, SOB or lightheadedness with hypotension: no Has patient had a PCN reaction causing severe rash involving mucus membranes or skin necrosis: no Has patient had a PCN reaction that required hospitalization: no Has patient had a PCN reaction occurring within the last 10 years: no If all of the above answers are "NO", then may proceed with Cephalosporin use.      Medication List    STOP taking these medications   predniSONE 5 MG tablet Commonly known as:  DELTASONE Replaced by:  predniSONE 10 MG (21) Tbpk tablet     TAKE these medications   ASPIRIN 81 PO Take 81 mg by mouth daily.   conjugated estrogens vaginal cream Commonly known as:  PREMARIN Place 1 Applicatorful vaginally 2 (two) times a week.   lidocaine-prilocaine cream Commonly known as:  EMLA APPLY EXTERNALLY TO THE AFFECTED AREA 1 TIME   magic mouthwash w/lidocaine Soln Take 10 mLs by mouth 3 (three) times daily as needed. What changed:  when to take this   oxyCODONE-acetaminophen 5-325 MG tablet Commonly known as:  PERCOCET/ROXICET Take 1-2 tablets by mouth every 4 (four) hours as needed for moderate pain or severe pain.   polyethylene glycol packet Commonly known as:  MIRALAX / GLYCOLAX Take 17 g by mouth daily as needed.   Potassium Gluconate 550 MG Tabs Take 1 tablet by mouth daily.   predniSONE 10 MG (21) Tbpk tablet Commonly known as:  STERAPRED UNI-PAK 21 TAB 40 mg PO (ORAL)  x 2 days 30  mg PO  (ORAL)  x 2 days 20 mg PO  (ORAL) x 2 days 10 mg PO  (ORAL) x 2 days then stop Replaces:  predniSONE 5  MG tablet   PROCYCLIDINE HCL PO Take 5 mg by mouth 3 (three) times daily. (Kemadrine)   senna 8.6 MG tablet Commonly known as:  SENOKOT Take 1 tablet by mouth daily as needed.   thiothixene 5 MG capsule Commonly known as:  NAVANE Take 10 mg by mouth at bedtime.   vitamin B-12 1000 MCG tablet Commonly known as:  CYANOCOBALAMIN Take 1,000 mcg by mouth daily.   Vitamin D3 50 MCG (2000 UT) Tabs Take 2,000 Units by mouth daily.         Management plans discussed with the patient and she is in agreement. Stable for discharge   Patient should follow up with ortho  CODE STATUS:     Code Status Orders  (From admission, onward)         Start     Ordered   12/15/18 0258  Full code  Continuous     12/15/18 0258        Code Status History    Date Active Date Inactive Code Status Order ID Comments User Context   07/06/2018 1809 07/09/2018 1259 Full Code [id number]  Benjamine Sprague, DO Inpatient   01/15/2017 1318 01/15/2017 1650 Full Code [id number]  Samara Deist, [facility] Inpatient   11/20/2016 1554 11/20/2016 1930 Full Code [id number]  Samara Deist, DPM Inpatient    Advance Directive Documentation     Most Recent Value  Type of Advance Directive  Healthcare Power of Attorney, Living will  Pre-existing out of facility DNR order (yellow form or pink MOST form)  -  "MOST" Form in Place?  -      TOTAL TIME TAKING CARE OF THIS PATIENT: 38 minutes.    Note: This dictation was prepared with Dragon dictation along with smaller phrase technology. Any transcriptional errors that result from this process are unintentional.  Vaughan Basta M.D on 12/19/2018 at 10:37 AM  Between 7am to 6pm - Pager - [phone] After 6pm go to [email or web address] - password [email or web address] [facility]  Office  [phone]  CC: Primary care physician; Clarisse Gouge,  MD

## 2018-12-19 NOTE — Clinical Social Work Note (Signed)
CSW received insurance authorization for patient to go to [facility] today. CSW notified patient and daughter of insurance approval. CSW also notified Claiborne Billings at [facility] [employer] and details. CSW will continue to follow for discharge planning.   [place], [place]

## 2018-12-21 ENCOUNTER — Other Ambulatory Visit: Payer: Medicare Other

## 2018-12-21 ENCOUNTER — Ambulatory Visit: Payer: Medicare Other

## 2018-12-21 ENCOUNTER — Ambulatory Visit: Payer: Medicare Other | Admitting: Urgent Care

## 2018-12-21 LAB — SURGICAL PATHOLOGY

## 2019-01-27 ENCOUNTER — Telehealth: Payer: Self-pay

## 2019-01-27 NOTE — Telephone Encounter (Signed)
Spoke with Ms Hoey which she was c/o having a mouth sore x 2-3 weeks which has not been able to heal and is request more magic mouth wash with Lidocaine. I have called the patient pharmacy and she will need a new script which they states they will fax to the office today for the refill / no PA needed. Ms Keisler was also informed that she has an appointment on Monday with Dr Mike Gip and she will access the neuropathy with the patient on Monday at her visit. Ms Pettitt was understanding and agreeable to keep schedule appointment on Monday.

## 2019-01-31 ENCOUNTER — Other Ambulatory Visit: Payer: Self-pay

## 2019-02-01 ENCOUNTER — Inpatient Hospital Stay: Payer: Medicare Other | Attending: Hematology and Oncology | Admitting: Hematology and Oncology

## 2019-02-01 ENCOUNTER — Encounter: Payer: Self-pay | Admitting: Hematology and Oncology

## 2019-02-01 ENCOUNTER — Inpatient Hospital Stay: Payer: Medicare Other

## 2019-02-01 ENCOUNTER — Telehealth: Payer: Self-pay

## 2019-02-01 VITALS — BP 116/72 | HR 72 | Temp 97.6°F | Resp 18 | Ht 64.0 in | Wt 153.3 lb

## 2019-02-01 DIAGNOSIS — M810 Age-related osteoporosis without current pathological fracture: Secondary | ICD-10-CM | POA: Diagnosis not present

## 2019-02-01 DIAGNOSIS — M129 Arthropathy, unspecified: Secondary | ICD-10-CM

## 2019-02-01 DIAGNOSIS — Z87891 Personal history of nicotine dependence: Secondary | ICD-10-CM | POA: Insufficient documentation

## 2019-02-01 DIAGNOSIS — M545 Low back pain, unspecified: Secondary | ICD-10-CM | POA: Insufficient documentation

## 2019-02-01 DIAGNOSIS — C182 Malignant neoplasm of ascending colon: Secondary | ICD-10-CM | POA: Insufficient documentation

## 2019-02-01 DIAGNOSIS — T451X5A Adverse effect of antineoplastic and immunosuppressive drugs, initial encounter: Secondary | ICD-10-CM | POA: Insufficient documentation

## 2019-02-01 DIAGNOSIS — R197 Diarrhea, unspecified: Secondary | ICD-10-CM | POA: Insufficient documentation

## 2019-02-01 DIAGNOSIS — J449 Chronic obstructive pulmonary disease, unspecified: Secondary | ICD-10-CM | POA: Diagnosis not present

## 2019-02-01 DIAGNOSIS — K5900 Constipation, unspecified: Secondary | ICD-10-CM

## 2019-02-01 DIAGNOSIS — F209 Schizophrenia, unspecified: Secondary | ICD-10-CM | POA: Insufficient documentation

## 2019-02-01 DIAGNOSIS — E559 Vitamin D deficiency, unspecified: Secondary | ICD-10-CM | POA: Diagnosis not present

## 2019-02-01 DIAGNOSIS — G5622 Lesion of ulnar nerve, left upper limb: Secondary | ICD-10-CM | POA: Diagnosis not present

## 2019-02-01 DIAGNOSIS — G629 Polyneuropathy, unspecified: Secondary | ICD-10-CM | POA: Insufficient documentation

## 2019-02-01 DIAGNOSIS — Z79899 Other long term (current) drug therapy: Secondary | ICD-10-CM | POA: Diagnosis not present

## 2019-02-01 DIAGNOSIS — E538 Deficiency of other specified B group vitamins: Secondary | ICD-10-CM | POA: Diagnosis not present

## 2019-02-01 DIAGNOSIS — D1803 Hemangioma of intra-abdominal structures: Secondary | ICD-10-CM | POA: Insufficient documentation

## 2019-02-01 DIAGNOSIS — Z7982 Long term (current) use of aspirin: Secondary | ICD-10-CM

## 2019-02-01 DIAGNOSIS — Z7189 Other specified counseling: Secondary | ICD-10-CM

## 2019-02-01 LAB — CBC WITH DIFFERENTIAL/PLATELET
Abs Immature Granulocytes: 0.04 10*3/uL (ref 0.00–0.07)
Basophils Absolute: 0 10*3/uL (ref 0.0–0.1)
Basophils Relative: 1 %
Eosinophils Absolute: 0.4 10*3/uL (ref 0.0–0.5)
Eosinophils Relative: 4 %
HCT: 39.2 % (ref 36.0–46.0)
Hemoglobin: 12.4 g/dL (ref 12.0–15.0)
Immature Granulocytes: 1 %
Lymphocytes Relative: 25 %
Lymphs Abs: 1.9 10*3/uL (ref 0.7–4.0)
MCH: 30.8 pg (ref 26.0–34.0)
MCHC: 31.6 g/dL (ref 30.0–36.0)
MCV: 97.3 fL (ref 80.0–100.0)
Monocytes Absolute: 0.8 10*3/uL (ref 0.1–1.0)
Monocytes Relative: 10 %
Neutro Abs: 4.8 10*3/uL (ref 1.7–7.7)
Neutrophils Relative %: 59 %
Platelets: 279 10*3/uL (ref 150–400)
RBC: 4.03 MIL/uL (ref 3.87–5.11)
RDW: 11.8 % (ref 11.5–15.5)
WBC: 7.9 10*3/uL (ref 4.0–10.5)
nRBC: 0 % (ref 0.0–0.2)

## 2019-02-01 LAB — COMPREHENSIVE METABOLIC PANEL
ALT: 14 U/L (ref 0–44)
AST: 18 U/L (ref 15–41)
Albumin: 3.7 g/dL (ref 3.5–5.0)
Alkaline Phosphatase: 266 U/L — ABNORMAL HIGH (ref 38–126)
Anion gap: 5 (ref 5–15)
BUN: 25 mg/dL — ABNORMAL HIGH (ref 8–23)
CO2: 25 mmol/L (ref 22–32)
Calcium: 9.1 mg/dL (ref 8.9–10.3)
Chloride: 106 mmol/L (ref 98–111)
Creatinine, Ser: 0.52 mg/dL (ref 0.44–1.00)
GFR calc Af Amer: >60 mL/min (ref >60)
GFR calc non Af Amer: >60 mL/min (ref >60)
Glucose, Bld: 90 mg/dL (ref 70–99)
Potassium: 3.5 mmol/L (ref 3.5–5.1)
Sodium: 136 mmol/L (ref 135–145)
Total Bilirubin: 0.6 mg/dL (ref 0.3–1.2)
Total Protein: 6.4 g/dL — ABNORMAL LOW (ref 6.5–8.1)

## 2019-02-01 LAB — MAGNESIUM: Magnesium: 2 mg/dL (ref 1.7–2.4)

## 2019-02-01 MED ORDER — SODIUM CHLORIDE 0.9% FLUSH
10.0000 mL | INTRAVENOUS | Status: DC | PRN
  Filled 2019-02-01: qty 10

## 2019-02-01 MED ORDER — HEPARIN SOD (PORK) LOCK FLUSH 100 UNIT/ML IV SOLN
500.0000 [IU] | Freq: Once | INTRAVENOUS | Status: AC
  Administered 2019-02-01: 10:00:00 500 [IU] via INTRAVENOUS

## 2019-02-01 NOTE — Telephone Encounter (Signed)
Faxed patient demographic with Insurance card for a new patient referral. Faxed conformation confirmed. Spoke with the office and they have received the new patient referral. They states they have reached out to the patient and she states her son will have to be there to do the visit. The office will be doing a telephone visit.

## 2019-02-01 NOTE — Progress Notes (Signed)
Patient c/o increase pain noted to her right leg with apply pressure ( walking) .

## 2019-02-01 NOTE — Progress Notes (Signed)
[facility]     [address], [address]     [place], [place] [address]     Phone: [phone]      Fax: [phone]        Clinic day:  02/01/2019   Referring physician:  Clarisse Gouge, MD   Chief Complaint: Kathy [name] is a 73 y.o. female with stage IIIB colon cancer who is seen for assessment prior to cycle #9 FOLFOX chemotherapy.   HPI:  The patient was last seen in the medical oncology clinic on 12/14/2018.  At that time, she had acute 10 out of 10 lumbar spine pain unrelieved by Tramadol.  She was felt to have an acute lumbar compression fracture.  She was sent to the ER for evaluation.  She was admitted to [facility] from 12/14/2018 - 12/18/2018.  Lumbar spine MRI revealed L3 and L4 superior endplate fractures involving anterior and middle columns with 40% loss of vertebral body height and edema indicating recent injury. There was 4 mm L3 and 6 mm L4 retropulsion of superior endplates.  There were bilateral sacral insufficiency fractures.  She underwent kyphoplasty L3 and L4 with lumbosacral vertebroplasty (sacroplasty) by Dr. Rudene Christians on 12/17/2018.  She has a UTI and was given 3 days of antibiotics.  She was scheduled for outpatient follow-up in 2 weeks.  She contacted the clinic on 01/27/2019 regarding poorly healing mouth sores.  Magic mouthwash with lidocaine was prescribed.  During the interim, she denies any abdominal symptoms except for mild constipation.  She states "I feel fine".  She notes issues with her back.  She describes having a couple of injections in her lumbar spine which helps her right leg (aches).  She recently had her second injection.  She states that she does not want any chemotherapy now.  She is worried about COVID-19 infection and "a compromised immune system".  She states that she doesn't want to take chemotherapy as "I just got my mobility".    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  She has  numbness and tingling intermittent in the tips of her toes.  She denies any trouble walking or pain at night keeping her up.  She denies any affect with activities of daily living (ADLs).   Past Medical History:  Diagnosis Date  . Arthritis    hips  . B12 deficiency   . Colon cancer ([place]) 07/06/2018  . 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 COPD (chronic obstructive pulmonary disease) ([place])   . Fibrocystic breast disease   . Mediastinal mass    removed 1/16  . Motion sickness    fair rides  . Osteoporosis   . Schizophrenia ([place])   . Vitamin D deficiency   . Wears dentures    partial upper and lower    Past Surgical History:  Procedure Laterality Date  . BREAST BIOPSY Left    neg  . BREAST BIOPSY Right 05/04/13   [place] bx/clip-neg  . COLONOSCOPY    . COLONOSCOPY WITH PROPOFOL N/A 06/10/2018   Procedure: COLONOSCOPY WITH PROPOFOL;  Surgeon: Toledo, [name], MD;  Location: [facility]COPY;  Service: Gastroenterology;  Laterality: N/A;  . ESOPHAGOGASTRODUODENOSCOPY N/A 06/10/2018   Procedure: ESOPHAGOGASTRODUODENOSCOPY (EGD);  Surgeon: Toledo, [name], MD;  Location: [facility] ENDOSCOPY;  Service: Gastroenterology;  Laterality: N/A;  . HALLUX VALGUS AKIN Right 11/20/2016   Procedure: HALLUX VALGUS AKIN;  Surgeon: Samara Deist, DPM;  Location:  [place];  Service: Podiatry;  Laterality: Right;  . HALLUX VALGUS AUSTIN Right 11/20/2016   Procedure: HALLUX VALGUS AUSTIN  Alroy Dust) right;  Surgeon: Samara Deist, DPM;  Location: [place];  Service: Podiatry;  Laterality: Right;  IVA with Popliteal  . HALLUX VALGUS AUSTIN Left 01/15/2017   Procedure: HALLUX VALGUS AUSTIN  Correction left foot  Iva Popiteal;  Surgeon: Samara Deist, DPM;  Location: [place];  Service: Podiatry;  Laterality: Left;  IVA Popliteal   . KYPHOPLASTY N/A 12/17/2018   Procedure: KYPHOPLASTY L3, L4 SACROPLASTY;  Surgeon: Hessie Knows, MD;  Location: [facility];  Service: Orthopedics;   Laterality: N/A;  . LAPAROSCOPIC RIGHT COLECTOMY N/A 07/06/2018   Procedure: LAPAROSCOPIC COLECTOMY;  Surgeon: Benjamine Sprague, DO;  Location: [facility];  Service: General;  Laterality: N/A;  . MEDIASTINAL MASS EXCISION Left 11/09/2014   Dr. Wynelle [place], [place]  . PORTACATH PLACEMENT Right 07/30/2018   Procedure: INSERTION PORT-A-CATH;  Surgeon: Benjamine Sprague, DO;  Location: [facility];  Service: General;  Laterality: Right;  . THORACOSCOPY Left 11/09/2014   with excision mediastinal mass  . TUBAL LIGATION      Family History  Problem Relation Age of Onset  . Cancer Father   . Breast cancer Neg Hx     Social History:  reports that she quit smoking about 4 years ago. She has never used smokeless tobacco. She reports that she does not drink alcohol or use drugs.  The patient lives in [place].  She is alone today.  She calls her son, [name], on the phone at the end of the visit.   Allergies:  Allergies  Allergen Reactions  . Biaxin [Clarithromycin] Shortness Of Breath  . Levofloxacin     Muscle pain  . Penicillins Rash    Has patient had a PCN reaction causing immediate rash, facial/tongue/throat swelling, SOB or lightheadedness with hypotension: no Has patient had a PCN reaction causing severe rash involving mucus membranes or skin necrosis: no Has patient had a PCN reaction that required hospitalization: no Has patient had a PCN reaction occurring within the last 10 years: no If all of the above answers are "NO", then may proceed with Cephalosporin use.     Current Medications: Current Outpatient Medications  Medication Sig Dispense Refill  . ASPIRIN 81 PO Take 81 mg by mouth daily.     . Cholecalciferol (VITAMIN D3) 2000 units TABS Take 2,000 Units by mouth daily.     . magic mouthwash w/lidocaine SOLN Take 10 mLs by mouth 3 (three) times daily as needed. (Patient taking differently: Take 10 mLs by mouth 2 (two) times daily. ) 450 mL 0  . polyethylene glycol (MIRALAX / GLYCOLAX) packet Take  17 g by mouth daily as needed.     . Potassium Gluconate 550 MG TABS Take 1 tablet by mouth daily.    Marland Kitchen PROCYCLIDINE HCL PO Take 5 mg by mouth 3 (three) times daily. [facility])     . senna (SENOKOT) 8.6 MG tablet Take 1 tablet by mouth daily as needed.     . thiothixene (NAVANE) 5 MG capsule Take 10 mg by mouth at bedtime.     . vitamin B-12 (CYANOCOBALAMIN) 1000 MCG tablet Take 1,000 mcg by mouth daily.     Marland Kitchen conjugated estrogens (PREMARIN) vaginal cream Place 1 Applicatorful vaginally 2 (two) times a week.     . lidocaine-prilocaine (EMLA) cream APPLY EXTERNALLY TO THE AFFECTED AREA 1 TIME (Patient not taking: Reported on 02/01/2019) 30 g  0  . oxyCODONE-acetaminophen (PERCOCET/ROXICET) 5-325 MG tablet Take 1-2 tablets by mouth every 4 (four) hours as needed for moderate pain or severe pain. (Patient not taking: Reported on 02/01/2019) 30 tablet 0  . predniSONE (STERAPRED UNI-PAK 21 TAB) 10 MG (21) TBPK tablet 40 mg PO (ORAL)  x 2 days 30 mg PO  (ORAL)  x 2 days 20 mg PO  (ORAL) x 2 days 10 mg PO  (ORAL) x 2 days then stop (Patient not taking: Reported on 02/01/2019) 21 tablet 0   No current facility-administered medications for this visit.    Facility-Administered Medications Ordered in Other Visits  Medication Dose Route Frequency Provider Last Rate Last Dose  . heparin lock flush 100 unit/mL  500 Units Intravenous Once Sindy Guadeloupe, MD      . heparin lock flush 100 unit/mL  500 Units Intravenous Once Corcoran, Melissa C, MD      . heparin lock flush 100 unit/mL  500 Units Intravenous Once Corcoran, Melissa C, MD      . heparin lock flush 100 unit/mL  500 Units Intravenous Once Corcoran, Melissa C, MD      . sodium chloride flush (NS) 0.9 % injection 10 mL  10 mL Intravenous PRN Sindy Guadeloupe, MD   10 mL at 10/05/18 0845  . sodium chloride flush (NS) 0.9 % injection 10 mL  10 mL Intravenous PRN Nolon Stalls C, MD   10 mL at 11/09/18 0840  . sodium chloride flush (NS) 0.9 % injection  10 mL  10 mL Intravenous PRN Lequita Asal, MD   10 mL at 11/23/18 0843  . sodium chloride flush (NS) 0.9 % injection 10 mL  10 mL Intravenous PRN Lequita Asal, MD        Review of Systems:  GENERAL:  Feels "fine".  No fevers, sweats or weight loss.  Weight stable. PERFORMANCE STATUS (ECOG):  1-2 HEENT:  No visual changes, runny nose, sore throat, mouth sores or tenderness. Lungs: No shortness of breath or cough.  No hemoptysis. Cardiac:  No chest pain, palpitations, orthopnea, or PND. GI:  No nausea, vomiting, diarrhea, constipation, melena or hematochezia. GU:  No urgency, frequency, dysuria, or hematuria. Musculoskeletal:  Back pain, improved.  No joint pain.  No muscle tenderness. Extremities:  No pain or swelling. Skin:  No rashes or skin changes. Neuro:  Numbness left 4th and 5th digit (ulnar distribution).  No headache, focal  weakness, balance or coordination issues. Endocrine:  No diabetes, thyroid issues, hot flashes or night sweats. Psych:  No mood changes, depression or anxiety. Pain:  Back pain, improved. Review of systems:  All other systems reviewed and found to be negative.   Physical Exam: Blood pressure 116/72, pulse 72, temperature 97.6 F (36.4 C), temperature source Tympanic, resp. rate 18, height 5' 4"  (1.626 m), weight 153 lb 5.3 oz (69.6 kg). GENERAL:  Well developed, well nourished, woman sitting comfortably in the exam room in no acute distress. MENTAL  Short brown graying hair.  Normocephalic, atraumatic, face symmetric, no Cushingoid features. EYES:  Blue eyes.  Pupils equal round and reactive to light and accomodation.  No conjunctivitis or scleral icterus. ENT:  Oropharynx clear without lesion.  Tongue normal. Mucous membranes moist.  RESPIRATORY:  Clear to auscultation without rales, wheezes or rhonchi. CARDIOVASCULAR:  Regular rate and rhythm without murmur, rub or gallop. ABDOMEN:  Soft, non-tender, with active bowel sounds, and no  hepatosplenomegaly.  No masses. SKIN:  No rashes, ulcers  or lesions. EXTREMITIES:  No edema, no skin discoloration or tenderness.  No palpable cords. LYMPH NODES: No palpable cervical, supraclavicular, axillary or inguinal adenopathy  NEUROLOGICAL: Resting tremor.  Left ulnar neuropathy.  No neuropathy in feet (sensation normal). PSYCH:  Appropriate.    Infusion on 02/01/2019  Component Date Value Ref Range Status  . Sodium 02/01/2019 136  135 - 145 mmol/L Final  . Potassium 02/01/2019 3.5  3.5 - 5.1 mmol/L Final  . Chloride 02/01/2019 106  98 - 111 mmol/L Final  . CO2 02/01/2019 25  22 - 32 mmol/L Final  . Glucose, Bld 02/01/2019 90  70 - 99 mg/dL Final  . BUN 02/01/2019 25* 8 - 23 mg/dL Final  . Creatinine, Ser 02/01/2019 0.52  0.44 - 1.00 mg/dL Final  . Calcium 02/01/2019 9.1  8.9 - 10.3 mg/dL Final  . Total Protein 02/01/2019 6.4* 6.5 - 8.1 g/dL Final  . Albumin 02/01/2019 3.7  3.5 - 5.0 g/dL Final  . AST 02/01/2019 18  15 - 41 U/L Final  . ALT 02/01/2019 14  0 - 44 U/L Final  . Alkaline Phosphatase 02/01/2019 266* 38 - 126 U/L Final  . Total Bilirubin 02/01/2019 0.6  0.3 - 1.2 mg/dL Final  . GFR calc non Af Amer 02/01/2019 >60  >60 mL/min Final  . GFR calc Af Amer 02/01/2019 >60  >60 mL/min Final  . Anion gap 02/01/2019 5  5 - 15 Final   Performed at [facility], [address], [place], [place] [address]  . WBC 02/01/2019 7.9  4.0 - 10.5 K/uL Final  . RBC 02/01/2019 4.03  3.87 - 5.11 MIL/uL Final  . Hemoglobin 02/01/2019 12.4  12.0 - 15.0 g/dL Final  . HCT 02/01/2019 39.2  36.0 - 46.0 % Final  . MCV 02/01/2019 97.3  80.0 - 100.0 fL Final  . MCH 02/01/2019 30.8  26.0 - 34.0 pg Final  . MCHC 02/01/2019 31.6  30.0 - 36.0 g/dL Final  . RDW 02/01/2019 11.8  11.5 - 15.5 % Final  . Platelets 02/01/2019 279  150 - 400 K/uL Final  . nRBC 02/01/2019 0.0  0.0 - 0.2 % Final  . Neutrophils Relative % 02/01/2019 59  % Final  . Neutro Abs 02/01/2019 4.8  1.7 - 7.7 K/uL  Final  . Lymphocytes Relative 02/01/2019 25  % Final  . Lymphs Abs 02/01/2019 1.9  0.7 - 4.0 K/uL Final  . Monocytes Relative 02/01/2019 10  % Final  . Monocytes Absolute 02/01/2019 0.8  0.1 - 1.0 K/uL Final  . Eosinophils Relative 02/01/2019 4  % Final  . Eosinophils Absolute 02/01/2019 0.4  0.0 - 0.5 K/uL Final  . Basophils Relative 02/01/2019 1  % Final  . Basophils Absolute 02/01/2019 0.0  0.0 - 0.1 K/uL Final  . Immature Granulocytes 02/01/2019 1  % Final  . Abs Immature Granulocytes 02/01/2019 0.04  0.00 - 0.07 K/uL Final   Performed at [facility], [address], [place], [place] [address]  . Magnesium 02/01/2019 2.0  1.7 - 2.4 mg/dL Final   Performed at [facility], [address] [place], [place] [address]    Assessment:  Kathy [name] is a 73 y.o. female with stage IIIB ascending colon cancer s/p hemicolectomy on 07/06/2018.  Pathology revealed a moderately differentiated adenocarcinoma with invasion through the visceral peritoneum.  There was metastatic carcinoma in 1 of 44 regional lymph nodes.  Margins were negative.  There was no lymphovascular  invasion or perineural invasion was identified.  There were no tumor deposits identified. Pathologic stage was pT4a pN1a.  MSI was stable.  CEA was 3.0 on 08/03/2018  Abdomen and pelvis CT on 06/17/2018 revealed a 3.2 cm constricting apple core type ascending colon cancer near the hepatic flexure.  There were small pericolonic lymph nodes but no overt adenopathy.  There was a 3.5 mm left hepatic lobe lesion characterized as hemangioma on MRI.  Chest CT on 06/17/2018 revealed small subcentimeter pulmonary nodules, nonspecific.  She is s/p 8 cycles of FOLFOX chemotherapy (08/03/2018 - 11/23/2018).  Because of her age and frail status, oxaliplatin was reduced to 65 mg/m2.   She had significant chemotherapy induced diarrhea, thus her 5-FU bolus was deleted after cycle #4.    She has mild chemotherapy induced  anemia.  She has a history of iron deficiency requiring IV iron in the past.  B12 was 617 and folate 18.1 on 08/17/2018.  Ferritin was 260 with an iron saturation of 34% and a TIBC of 309 on 10/19/2018.  She was admitted to [facility] from 12/14/2018 - 12/18/2018 with acute compression fractures.  She underwent kyphoplasty L3 and L4 with lumbosacral vertebroplasty (sacroplasty) on 12/17/2018.   She has schizophrenia.  Symptomatically, her back pain has improved since her kyphoplasty.  She is receiving injections.  She has a left ulnar neuropathy.  She is concerned about COVID-19 infection.  Plan: 1.   Labs today: CBC with diff, CMP, Mg. 2.   Stage IIIB ascending colon cancer  Patient is s/p 8 cycles of modified FOLFOX chemotherapy (no bolus 5FU; oxaliplatin 65 mg/m2).  Discuss initial plan for 12 cycles of chemotherapy.  Discuss excellent counts with prior cycles.  Discuss consideration of 4 additional cycles of treatment- patient declines.  Discuss consideration of treatment after COVID-19.  Discuss unclear benefit if significant time elapses before completing therapy.  Discuss curative plan for treatment.  Patient wishes to postpone.   Spoke with patient's son who concurs with patient's decision. 3.   Lumbar spine pain  Improved compression fracture s/p kyphoplasty.  Residual discomfort per patient and thus receiving spinal injections. 4.   Left ulnar neuropathy  Etiology unclear.  Consult neurology. 5.   Patient's son to call if she wishes to continue chemotherapy. 6.   RTC in 12 weeks for MD assessment and labs (CBC with diff, CMP, Mg, CEA).   I discussed the assessment and treatment plan with the patient.  The patient and her son were provided an opportunity to ask questions and all were answered.  The patient and her son agreed with the plan and demonstrated an understanding of the instructions.  The patient was advised to call back or seek an in person evaluation if the symptoms worsen  or if the condition fails to improve as anticipated.   Lequita Asal, MD, PhD  02/01/2019, 9:25 AM

## 2019-04-05 ENCOUNTER — Other Ambulatory Visit: Payer: Self-pay | Admitting: Family Medicine

## 2019-04-05 DIAGNOSIS — Z1231 Encounter for screening mammogram for malignant neoplasm of breast: Secondary | ICD-10-CM

## 2019-04-21 NOTE — Progress Notes (Signed)
[facility]  [address] [place], [place] [address] Phone: [phone]  Fax: [phone]   Clinic Day:  04/26/2019  Referring physician: Clarisse Gouge, MD  Chief Complaint: Kathy [name] is a 73 y.o. female with stage IIIB colon cancer who is seen for 3 month assessment.  HPI: The patient was last seen in the medical oncology clinic on 02/01/2019. At that time, her back pain had improved since her kyphoplasty. She was receiving injections. She had left ulnar neuropathy. She was concerned about COVID-19 infection.   During the interim, she is doing "fine." She is feeling better, and can walk around without use of a wheelchair. She denies any fevers, sweats, or weight loss. She notes mild chest pain that resolves with aspirin.  She currently has a tooth infection for which she has been on clindamycin for 2 weeks. She notes feeling emotional and cursing more frequently over the past several months. Numbness in her hand has mildly improved but is still bothersome.   Her son notes she has more strength and has been more mobile.   She has a colonoscopy scheduled with Dr. Lysle Pearl in the next 2 months.    Past Medical History:  Diagnosis Date   Arthritis    hips   B12 deficiency    Colon cancer ([place]) 07/06/2018   Constipation due to slow transit    COPD (chronic obstructive pulmonary disease) (HCC)    Fibrocystic breast disease    Mediastinal mass    removed 1/16   Motion sickness    fair rides   Osteoporosis    Schizophrenia ([place])    Vitamin D deficiency    Wears dentures    partial upper and lower    Past Surgical History:  Procedure Laterality Date   BREAST BIOPSY Left    neg   BREAST BIOPSY Right 05/04/13   [place] bx/clip-neg   COLONOSCOPY     COLONOSCOPY WITH PROPOFOL N/A 06/10/2018   Procedure: COLONOSCOPY WITH PROPOFOL;  Surgeon: Toledo, [name], MD;  Location: [facility];  Service: Gastroenterology;  Laterality:  N/A;   ESOPHAGOGASTRODUODENOSCOPY N/A 06/10/2018   Procedure: ESOPHAGOGASTRODUODENOSCOPY (EGD);  Surgeon: Toledo, [name], MD;  Location: [facility];  Service: Gastroenterology;  Laterality: N/A;   HALLUX VALGUS AKIN Right 11/20/2016   Procedure: HALLUX VALGUS AKIN;  Surgeon: Samara Deist, DPM;  Location: [facility];  Service: Podiatry;  Laterality: Right;   HALLUX VALGUS AUSTIN Right 11/20/2016   Procedure: HALLUX VALGUS AUSTIN  Alroy Dust) right;  Surgeon: Samara Deist, DPM;  Location: [facility];  Service: Podiatry;  Laterality: Right;  IVA with Popliteal   HALLUX VALGUS AUSTIN Left 01/15/2017   Procedure: HALLUX VALGUS AUSTIN  Correction left foot  Iva Popiteal;  Surgeon: Samara Deist, DPM;  Location: [facility];  Service: Podiatry;  Laterality: Left;  IVA Popliteal    KYPHOPLASTY N/A 12/17/2018   Procedure: KYPHOPLASTY L3, L4 SACROPLASTY;  Surgeon: Hessie Knows, MD;  Location: [facility];  Service: Orthopedics;  Laterality: N/A;   LAPAROSCOPIC RIGHT COLECTOMY N/A 07/06/2018   Procedure: LAPAROSCOPIC COLECTOMY;  Surgeon: Benjamine Sprague, DO;  Location: [facility];  Service: General;  Laterality: N/A;   MEDIASTINAL MASS EXCISION Left 11/09/2014   Dr. Wynelle Cleveland, [place] Right 07/30/2018   Procedure: INSERTION PORT-A-CATH;  Surgeon: Benjamine Sprague, DO;  Location: [facility];  Service: General;  Laterality: Right;   THORACOSCOPY Left 11/09/2014   with excision mediastinal mass   TUBAL  LIGATION      Family History  Problem Relation Age of Onset   Cancer Father    Breast cancer Neg Hx     Social History:  reports that she quit smoking about 4 years ago. She has never used smokeless tobacco. She reports that she does not drink alcohol or use drugs. The patient lives in [place]. Her son is [name]. She is alone today with her son over the phone.   Allergies:  Allergies  Allergen Reactions   Biaxin [Clarithromycin] Shortness Of Breath    Levofloxacin     Muscle pain   Penicillins Rash    Has patient had a PCN reaction causing immediate rash, facial/tongue/throat swelling, SOB or lightheadedness with hypotension: no Has patient had a PCN reaction causing severe rash involving mucus membranes or skin necrosis: no Has patient had a PCN reaction that required hospitalization: no Has patient had a PCN reaction occurring within the last 10 years: no If all of the above answers are "NO", then may proceed with Cephalosporin use.     Current Medications: Current Outpatient Medications  Medication Sig Dispense Refill   ASPIRIN 81 PO Take 81 mg by mouth daily.      Cholecalciferol (VITAMIN D3) 2000 units TABS Take 2,000 Units by mouth daily.      clindamycin (CLEOCIN) 300 MG capsule Take 300 mg by mouth 2 (two) times a day.      conjugated estrogens (PREMARIN) vaginal cream Place 1 Applicatorful vaginally 2 (two) times a week.      lidocaine-prilocaine (EMLA) cream APPLY EXTERNALLY TO THE AFFECTED AREA 1 TIME 30 g 0   Potassium Gluconate 550 MG TABS Take 1 tablet by mouth daily.     PROCYCLIDINE HCL PO Take 5 mg by mouth 3 (three) times daily. (Kemadrine)      senna (SENOKOT) 8.6 MG tablet Take 1 tablet by mouth daily as needed.      thiothixene (NAVANE) 10 MG capsule Take 10 mg by mouth daily.      vitamin B-12 (CYANOCOBALAMIN) 1000 MCG tablet Take 1,000 mcg by mouth daily.      gabapentin (NEURONTIN) 100 MG capsule Take 100 mg twice a day for one week, then increase to 200 mg(2 tablets) twice a day and continue     No current facility-administered medications for this visit.    Facility-Administered Medications Ordered in Other Visits  Medication Dose Route Frequency Provider Last Rate Last Dose   heparin lock flush 100 unit/mL  500 Units Intravenous Once Sindy Guadeloupe, MD       heparin lock flush 100 unit/mL  500 Units Intravenous Once ,  C, MD       heparin lock flush 100 unit/mL  500 Units  Intravenous Once Mike Gip,  C, MD       sodium chloride flush (NS) 0.9 % injection 10 mL  10 mL Intravenous PRN Sindy Guadeloupe, MD   10 mL at 10/05/18 0845   sodium chloride flush (NS) 0.9 % injection 10 mL  10 mL Intravenous PRN Nolon Stalls C, MD   10 mL at 11/09/18 0840   sodium chloride flush (NS) 0.9 % injection 10 mL  10 mL Intravenous PRN Nolon Stalls C, MD   10 mL at 11/23/18 0843   sodium chloride flush (NS) 0.9 % injection 10 mL  10 mL Intravenous PRN Lequita Asal, MD   10 mL at 04/26/19 1101    Review of Systems  Constitutional: Positive for weight  loss (2lbs). Negative for chills, diaphoresis, fever and malaise/fatigue.       Feels "fine."  HENT: Negative.  Negative for congestion, hearing loss, sinus pain and sore throat.        Tooth infection  Eyes: Negative.  Negative for blurred vision.  Respiratory: Negative.  Negative for cough, shortness of breath and wheezing.   Cardiovascular: Positive for chest pain (mild, resolves with aspirin). Negative for palpitations, orthopnea, leg swelling and PND.  Gastrointestinal: Negative.  Negative for abdominal pain, blood in stool, constipation, diarrhea, melena, nausea and vomiting.  Genitourinary: Negative.  Negative for dysuria, frequency, hematuria and urgency.  Musculoskeletal: Positive for back pain (s/p kyphoplasty). Negative for joint pain and myalgias.  Skin: Negative.  Negative for rash.  Neurological: Positive for sensory change (numbness left 4th and 5th digit (ulnar distribution)). Negative for dizziness, tingling, weakness and headaches.  Endo/Heme/Allergies: Negative.  Does not bruise/bleed easily.  Psychiatric/Behavioral: Negative.  Negative for depression, memory loss and substance abuse. The patient is not nervous/anxious and does not have insomnia.        Schizophrenia.  All other systems reviewed and are negative.   Performance status (ECOG): 1-2  Blood pressure 110/72, pulse 78,  temperature (!) 97.3 F (36.3 C), temperature source Tympanic, resp. rate 17, weight 151 lb 9.1 oz (68.8 kg).   Physical Exam  Constitutional: She is oriented to person, place, and time. She appears well-developed and well-nourished. No distress.  HENT:  Head: Normocephalic and atraumatic.  Mouth/Throat: Oropharynx is clear and moist. No oropharyngeal exudate.  Short brown graying hair. Mask.  Eyes: Pupils are equal, round, and reactive to light. Conjunctivae and EOM are normal. No scleral icterus.  Blue eyes.  Neck: Normal range of motion. Neck supple. No JVD present.  Cardiovascular: Normal rate, regular rhythm and normal heart sounds.  No murmur heard. Pulmonary/Chest: Effort normal and breath sounds normal. No respiratory distress. She has no wheezes. She has no rales.  Abdominal: Soft. Bowel sounds are normal. She exhibits no distension and no mass. There is no abdominal tenderness. There is no rebound and no guarding.  Musculoskeletal: Normal range of motion.        General: No tenderness or edema.  Lymphadenopathy:    She has no cervical adenopathy.    She has no axillary adenopathy.       Right: No supraclavicular adenopathy present.       Left: No supraclavicular adenopathy present.  Neurological: She is alert and oriented to person, place, and time. She displays tremor (resting tremor).  Left ulnar neuropathy.  Skin: Skin is warm and dry. No rash noted. She is not diaphoretic. No erythema. No pallor.  Psychiatric: She has a normal mood and affect. Her behavior is normal. Judgment and thought content normal.  Nursing note and vitals reviewed.   Infusion on 04/26/2019  Component Date Value Ref Range Status   Magnesium 04/26/2019 2.1  1.7 - 2.4 mg/dL Final   Performed at [facility], [address] [place], [place] [address]   Sodium 04/26/2019 137  135 - 145 mmol/L Final   Potassium 04/26/2019 3.8  3.5 - 5.1 mmol/L Final   Chloride 04/26/2019 103  98 -  111 mmol/L Final   CO2 04/26/2019 24  22 - 32 mmol/L Final   Glucose, Bld 04/26/2019 98  70 - 99 mg/dL Final   BUN 04/26/2019 14  8 - 23 mg/dL Final   Creatinine, Ser 04/26/2019 0.58  0.44 - 1.00 mg/dL Final  Calcium 04/26/2019 9.0  8.9 - 10.3 mg/dL Final   Total Protein 04/26/2019 6.9  6.5 - 8.1 g/dL Final   Albumin 04/26/2019 4.0  3.5 - 5.0 g/dL Final   AST 04/26/2019 21  15 - 41 U/L Final   ALT 04/26/2019 16  0 - 44 U/L Final   Alkaline Phosphatase 04/26/2019 218* 38 - 126 U/L Final   Total Bilirubin 04/26/2019 0.4  0.3 - 1.2 mg/dL Final   GFR calc non Af Amer 04/26/2019 >60  >60 mL/min Final   GFR calc Af Amer 04/26/2019 >60  >60 mL/min Final   Anion gap 04/26/2019 10  5 - 15 Final   Performed at [facility] [address] [place], [place] [address]   WBC 04/26/2019 7.3  4.0 - 10.5 K/uL Final   RBC 04/26/2019 4.15  3.87 - 5.11 MIL/uL Final   Hemoglobin 04/26/2019 12.8  12.0 - 15.0 g/dL Final   HCT 04/26/2019 39.1  36.0 - 46.0 % Final   MCV 04/26/2019 94.2  80.0 - 100.0 fL Final   MCH 04/26/2019 30.8  26.0 - 34.0 pg Final   MCHC 04/26/2019 32.7  30.0 - 36.0 g/dL Final   RDW 04/26/2019 13.2  11.5 - 15.5 % Final   Platelets 04/26/2019 255  150 - 400 K/uL Final   nRBC 04/26/2019 0.0  0.0 - 0.2 % Final   Neutrophils Relative % 04/26/2019 67  % Final   Neutro Abs 04/26/2019 4.9  1.7 - 7.7 K/uL Final   Lymphocytes Relative 04/26/2019 16  % Final   Lymphs Abs 04/26/2019 1.2  0.7 - 4.0 K/uL Final   Monocytes Relative 04/26/2019 7  % Final   Monocytes Absolute 04/26/2019 0.5  0.1 - 1.0 K/uL Final   Eosinophils Relative 04/26/2019 8  % Final   Eosinophils Absolute 04/26/2019 0.6* 0.0 - 0.5 K/uL Final   Basophils Relative 04/26/2019 1  % Final   Basophils Absolute 04/26/2019 0.1  0.0 - 0.1 K/uL Final   Immature Granulocytes 04/26/2019 1  % Final   Abs Immature Granulocytes 04/26/2019 0.04  0.00 - 0.07 K/uL Final   Performed at [facility], [address] [place],  [address]    Assessment:  DAMARA KLUNDER is a 73 y.o. female with stage IIIB ascending colon cancer s/p hemicolectomy on 07/06/2018.  Pathology revealed a moderately differentiated adenocarcinoma with invasion through the visceral peritoneum. There was metastatic carcinoma in 1 of 44 regional lymph nodes. Margins were negative. There was no lymphovascular invasion or perineural invasion was identified. There were no tumor deposits identified. Pathologic stage waspT4a pN1a.MSI was stable.  CEA was 3.0 on 08/03/2018  Abdomen and pelvis CT on 06/17/2018 revealed a 3.2 cm constricting apple core type ascending colon cancer near the hepatic flexure.  There were small pericolonic lymph nodes but no overt adenopathy. There was a 3.5 mm left hepatic lobe lesion characterized as hemangioma on MRI. Chest CT on 06/17/2018 revealed small subcentimeter pulmonary nodules, nonspecific.  She is s/p 8 cycles of FOLFOX chemotherapy (08/03/2018 - 11/23/2018).  Because of her age and frail status, oxaliplatin was reduced to 65 mg/m2.   She had significant chemotherapy induced diarrhea, thus her 5-FU bolus was deleted after cycle #4.   She has mild chemotherapy induced anemia.  She has a history of iron deficiency requiring IV iron in the past.  B12 was 617 and folate 18.1 on 08/17/2018.  Ferritin was 260 with an iron saturation of 34%  and a TIBC of 309 on 10/19/2018.  She was admitted to [facility] from 12/14/2018 - 12/18/2018 with acute compression fractures.  She underwent kyphoplasty L3 and L4 with lumbosacral vertebroplasty (sacroplasty) on 12/17/2018.   She has schizophrenia.  Symptomatically, she denies any abdominal pain, melena or hematochezia.  She has a dental infection.  Exam is stable.  Plan: 1.   Labs today: CBC with diff, CMP, Mg, CEA. 2.   Stage IIIB ascending colon cancer             She is s/p 8 cycles of modified FOLFOX chemotherapy (no  bolus 5FU; oxaliplatin 65 mg/m2).             Clinically she is doing well.  Discuss plan for follow-up colonoscopy with Dr Lysle Pearl.  Schedule chest, abdomen and pelvic CT on 06/18/2019 3.   Elevated alkaline phosphatase             Etiology unclear and felt possibly due to compression fractures.  Alkaline phosphatase was 266 on 02/01/2019.  Alkaline phosphatase was 218 (38-126) today. 4.   RTC in 3 months for MD assessment, labs (CBC with diff, CMP, CEA), and review of CT scans.  Addendum:  CEA was 14.3 (returned after clinic visit).  Patient's son contacted.  Plan to repeat CEA in 2 weeks.  If CEA is still elevated, plan to move up planned CT scans.  I discussed the assessment and treatment plan with the patient.  The patient was provided an opportunity to ask questions and all were answered.  The patient agreed with the plan and demonstrated an understanding of the instructions.  The patient was advised to call back if the symptoms worsen or if the condition fails to improve as anticipated.  I provided 15 minutes of face-to-face time during this this encounter and > 50% was spent counseling as documented under my assessment and plan.    Lequita Asal, MD, PhD    04/26/2019, 11:48 AM  I, Cloyde Reams Dorshimer, am acting as Education administrator for [employer]. Mike Gip, MD, PhD.  I,  C. Mike Gip, MD, have reviewed the above documentation for accuracy and completeness, and I agree with the above.

## 2019-04-22 ENCOUNTER — Telehealth: Payer: Self-pay | Admitting: Hematology and Oncology

## 2019-04-22 ENCOUNTER — Other Ambulatory Visit: Payer: Self-pay

## 2019-04-22 NOTE — Telephone Encounter (Signed)
Spoke with pt to confirm appt date/time, do pre-appt screen which was completed, and adv of Covid-19 guidelines for appt regarding screening questions, temperature check, face mask required, and no visitors allowed °

## 2019-04-26 ENCOUNTER — Inpatient Hospital Stay: Payer: Medicare Other

## 2019-04-26 ENCOUNTER — Other Ambulatory Visit: Payer: Self-pay

## 2019-04-26 ENCOUNTER — Encounter: Payer: Self-pay | Admitting: Hematology and Oncology

## 2019-04-26 ENCOUNTER — Inpatient Hospital Stay: Payer: Medicare Other | Attending: Hematology and Oncology | Admitting: Hematology and Oncology

## 2019-04-26 VITALS — BP 110/72 | HR 78 | Temp 97.3°F | Resp 17 | Wt 151.6 lb

## 2019-04-26 DIAGNOSIS — M549 Dorsalgia, unspecified: Secondary | ICD-10-CM | POA: Diagnosis not present

## 2019-04-26 DIAGNOSIS — F209 Schizophrenia, unspecified: Secondary | ICD-10-CM | POA: Insufficient documentation

## 2019-04-26 DIAGNOSIS — Z87891 Personal history of nicotine dependence: Secondary | ICD-10-CM | POA: Diagnosis not present

## 2019-04-26 DIAGNOSIS — K047 Periapical abscess without sinus: Secondary | ICD-10-CM | POA: Insufficient documentation

## 2019-04-26 DIAGNOSIS — J449 Chronic obstructive pulmonary disease, unspecified: Secondary | ICD-10-CM | POA: Diagnosis not present

## 2019-04-26 DIAGNOSIS — D1803 Hemangioma of intra-abdominal structures: Secondary | ICD-10-CM

## 2019-04-26 DIAGNOSIS — R079 Chest pain, unspecified: Secondary | ICD-10-CM

## 2019-04-26 DIAGNOSIS — E559 Vitamin D deficiency, unspecified: Secondary | ICD-10-CM | POA: Diagnosis not present

## 2019-04-26 DIAGNOSIS — M129 Arthropathy, unspecified: Secondary | ICD-10-CM

## 2019-04-26 DIAGNOSIS — E538 Deficiency of other specified B group vitamins: Secondary | ICD-10-CM | POA: Diagnosis not present

## 2019-04-26 DIAGNOSIS — R748 Abnormal levels of other serum enzymes: Secondary | ICD-10-CM | POA: Diagnosis not present

## 2019-04-26 DIAGNOSIS — G5622 Lesion of ulnar nerve, left upper limb: Secondary | ICD-10-CM | POA: Diagnosis not present

## 2019-04-26 DIAGNOSIS — C182 Malignant neoplasm of ascending colon: Secondary | ICD-10-CM

## 2019-04-26 DIAGNOSIS — Z79899 Other long term (current) drug therapy: Secondary | ICD-10-CM | POA: Diagnosis not present

## 2019-04-26 LAB — COMPREHENSIVE METABOLIC PANEL
ALT: 16 U/L (ref 0–44)
AST: 21 U/L (ref 15–41)
Albumin: 4 g/dL (ref 3.5–5.0)
Alkaline Phosphatase: 218 U/L — ABNORMAL HIGH (ref 38–126)
Anion gap: 10 (ref 5–15)
BUN: 14 mg/dL (ref 8–23)
CO2: 24 mmol/L (ref 22–32)
Calcium: 9 mg/dL (ref 8.9–10.3)
Chloride: 103 mmol/L (ref 98–111)
Creatinine, Ser: 0.58 mg/dL (ref 0.44–1.00)
GFR calc Af Amer: >60 mL/min (ref >60)
GFR calc non Af Amer: >60 mL/min (ref >60)
Glucose, Bld: 98 mg/dL (ref 70–99)
Potassium: 3.8 mmol/L (ref 3.5–5.1)
Sodium: 137 mmol/L (ref 135–145)
Total Bilirubin: 0.4 mg/dL (ref 0.3–1.2)
Total Protein: 6.9 g/dL (ref 6.5–8.1)

## 2019-04-26 LAB — CBC WITH DIFFERENTIAL/PLATELET
Abs Immature Granulocytes: 0.04 10*3/uL (ref 0.00–0.07)
Basophils Absolute: 0.1 10*3/uL (ref 0.0–0.1)
Basophils Relative: 1 %
Eosinophils Absolute: 0.6 10*3/uL — ABNORMAL HIGH (ref 0.0–0.5)
Eosinophils Relative: 8 %
HCT: 39.1 % (ref 36.0–46.0)
Hemoglobin: 12.8 g/dL (ref 12.0–15.0)
Immature Granulocytes: 1 %
Lymphocytes Relative: 16 %
Lymphs Abs: 1.2 10*3/uL (ref 0.7–4.0)
MCH: 30.8 pg (ref 26.0–34.0)
MCHC: 32.7 g/dL (ref 30.0–36.0)
MCV: 94.2 fL (ref 80.0–100.0)
Monocytes Absolute: 0.5 10*3/uL (ref 0.1–1.0)
Monocytes Relative: 7 %
Neutro Abs: 4.9 10*3/uL (ref 1.7–7.7)
Neutrophils Relative %: 67 %
Platelets: 255 10*3/uL (ref 150–400)
RBC: 4.15 MIL/uL (ref 3.87–5.11)
RDW: 13.2 % (ref 11.5–15.5)
WBC: 7.3 10*3/uL (ref 4.0–10.5)
nRBC: 0 % (ref 0.0–0.2)

## 2019-04-26 LAB — MAGNESIUM: Magnesium: 2.1 mg/dL (ref 1.7–2.4)

## 2019-04-26 MED ORDER — HEPARIN SOD (PORK) LOCK FLUSH 100 UNIT/ML IV SOLN
500.0000 [IU] | Freq: Once | INTRAVENOUS | Status: AC
  Administered 2019-04-26: 11:00:00 500 [IU] via INTRAVENOUS

## 2019-04-26 MED ORDER — SODIUM CHLORIDE 0.9% FLUSH
10.0000 mL | INTRAVENOUS | Status: DC | PRN
  Administered 2019-04-26: 10 mL via INTRAVENOUS
  Filled 2019-04-26: qty 10

## 2019-04-26 NOTE — Progress Notes (Signed)
Pt here for follow up. Reports she is currently on Clindamycin for a tooth/gum infection. Patient reports her last pill is tonight. Patient also reports she has been emotional for the past several months. States she has been crying and cursing "for no reason". States she will discuss this with her PCP during her appt. This month.

## 2019-04-27 ENCOUNTER — Telehealth: Payer: Self-pay

## 2019-04-27 ENCOUNTER — Other Ambulatory Visit: Payer: Self-pay

## 2019-04-27 DIAGNOSIS — R970 Elevated carcinoembryonic antigen [CEA]: Secondary | ICD-10-CM

## 2019-04-27 LAB — CEA: CEA: 14.3 ng/mL — ABNORMAL HIGH (ref 0.0–4.7)

## 2019-04-27 NOTE — Telephone Encounter (Signed)
Spoke with patient's son, [name], regarding elevated CEA levels. Advised Dr. Mike Gip would like to repeat lab in 2 weeks and if it continues to stay elevated then she would like to schedule CT scan sooner. Son verbalizes understanding and request lab appt. For Thursday 7/23 at 1015.

## 2019-04-27 NOTE — Telephone Encounter (Signed)
-----   Message from Lequita Asal, MD sent at 04/27/2019  6:15 AM EDT ----- Regarding: Please call patient and/or patient's son regarding her CEA.  I would repeat in 2 weeks.  If still elevated, would reschedule her CT scans sooner.  M ----- Message ----- From: Buel Ream, [facility] [place] [facility] [place] Sent: 04/26/2019  11:23 AM EDT To: Lequita Asal, MD

## 2019-05-12 ENCOUNTER — Other Ambulatory Visit: Payer: Self-pay

## 2019-05-13 ENCOUNTER — Inpatient Hospital Stay: Payer: Medicare Other

## 2019-05-13 DIAGNOSIS — R748 Abnormal levels of other serum enzymes: Secondary | ICD-10-CM

## 2019-05-13 DIAGNOSIS — C182 Malignant neoplasm of ascending colon: Secondary | ICD-10-CM | POA: Diagnosis not present

## 2019-05-13 DIAGNOSIS — R970 Elevated carcinoembryonic antigen [CEA]: Secondary | ICD-10-CM

## 2019-05-14 ENCOUNTER — Telehealth: Payer: Self-pay

## 2019-05-14 LAB — CEA: CEA: 15.9 ng/mL — ABNORMAL HIGH (ref 0.0–4.7)

## 2019-05-14 NOTE — Telephone Encounter (Signed)
-----   Message from Lequita Asal, MD sent at 05/14/2019  1:18 PM EDT ----- Regarding: Please call patient's son  CEA remains elevated.  Would pursue PET scan.  M ----- Message ----- From: Buel Ream, Lab In [place] Sent: 05/14/2019   6:37 AM EDT To: Lequita Asal, MD

## 2019-05-14 NOTE — Telephone Encounter (Signed)
Spoke with Kathy [name] to inform her that her CEA level is still elevated at 15.9. The patient has agreed to keep schedule scan. The patient was understanding and agreeable.

## 2019-05-15 LAB — ALKALINE PHOSPHATASE, ISOENZYMES
Alk Phos Bone Fract: 58 % (ref 14–68)
Alk Phos Liver Fract: 39 % (ref 18–85)
Alk Phos: 222 IU/L — ABNORMAL HIGH (ref 39–117)
Intestinal %: 3 % (ref 0–18)

## 2019-05-17 ENCOUNTER — Ambulatory Visit
Admission: RE | Admit: 2019-05-17 | Discharge: 2019-05-17 | Disposition: A | Discharge status: Home / Self Care | Payer: Medicare Other | Source: Ambulatory Visit | Attending: Family Medicine | Admitting: Family Medicine

## 2019-05-17 ENCOUNTER — Other Ambulatory Visit: Payer: Self-pay

## 2019-05-17 DIAGNOSIS — Z1231 Encounter for screening mammogram for malignant neoplasm of breast: Secondary | ICD-10-CM | POA: Insufficient documentation

## 2019-05-17 HISTORY — DX: Personal history of antineoplastic chemotherapy: Z92.21

## 2019-05-20 ENCOUNTER — Other Ambulatory Visit: Payer: Self-pay | Admitting: Family Medicine

## 2019-05-20 DIAGNOSIS — N6310 Unspecified lump in the right breast, unspecified quadrant: Secondary | ICD-10-CM

## 2019-05-20 DIAGNOSIS — R928 Other abnormal and inconclusive findings on diagnostic imaging of breast: Secondary | ICD-10-CM

## 2019-06-07 ENCOUNTER — Ambulatory Visit: Payer: Medicare Other

## 2019-06-07 ENCOUNTER — Other Ambulatory Visit: Payer: Medicare Other

## 2019-06-18 ENCOUNTER — Ambulatory Visit: Admission: RE | Admit: 2019-06-18 | Payer: Medicare Other | Source: Ambulatory Visit

## 2019-07-14 ENCOUNTER — Telehealth: Payer: Self-pay

## 2019-07-14 NOTE — Telephone Encounter (Signed)
Per Ms Winslett request she would not like to keep follow up apppointment. the patient refused to keep follow up appointment. Does not want to be followed.

## 2019-07-14 NOTE — Telephone Encounter (Signed)
-----   Message from Lequita Asal, MD sent at 07/13/2019  4:42 PM EDT ----- Regarding: FW: cancelled appointments  Please call patient or patient's son, Kathy [name] (he has been involved with her visits).  She was to have scans done earlier(04/2019), but I do not see that they were done.  Did she have scans done elsewhere?  How can we help?  M ----- Message ----- From: Liana Crocker Sent: 07/13/2019   3:10 PM EDT To: Lequita Asal, MD Subject: cancelled appointments                         Kathy [name] phoned and said that she did not want to come to her appointments on 07-26-19 for port flush/lab and to see you because her cancer had spread. I just thought I'd let you know this. Thanks!

## 2019-07-26 ENCOUNTER — Other Ambulatory Visit: Payer: Medicare Other

## 2019-07-26 ENCOUNTER — Ambulatory Visit: Payer: Medicare Other | Admitting: Hematology and Oncology

## 2019-09-21 IMAGING — MR MR ABDOMEN WO/W CM
20 series · 47 of 48 positions shown · IV contrast (multihance)
Comparison: CT abdomen/pelvis dated 06/09/2017

CLINICAL DATA: Newly diagnosed colon cancer, liver lesion on CT

EXAM:
MRI ABDOMEN WITHOUT AND WITH CONTRAST
TECHNIQUE: Multiplanar multisequence MR imaging of the abdomen was performed
both before and after the administration of intravenous contrast.
CONTRAST:  14mL MULTIHANCE GADOBENATE DIMEGLUMINE 529 MG/ML IV SOLN

[Series 3: T2 · coronal · 6.0mm · 1.19mm/px · 2 of 30 slices shown (1 of 2)]
[im 1/30]
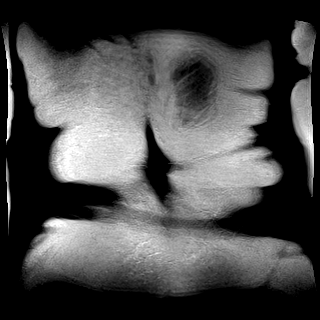
[im 30/30]
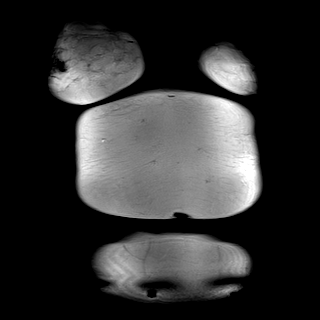

[Series 4: T2 · axial · 6.0mm · 1.19mm/px · z∈[-119,+83]mm · 2 of 29 slices shown (2 of 2)]
[im 1/29]
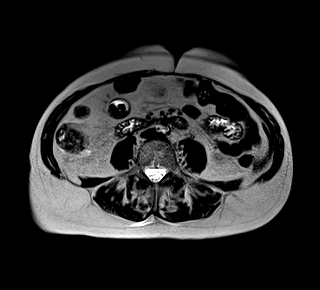
[im 29/29]
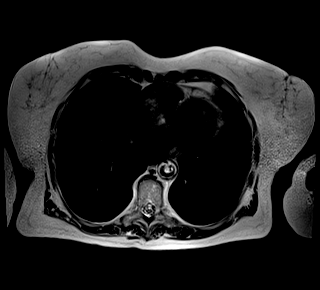

[Series 6: T2 fat-sat · axial · 6.0mm · 1.19mm/px · z∈[-122,+87]mm · 2 of 30 slices shown]
[im 1/30]
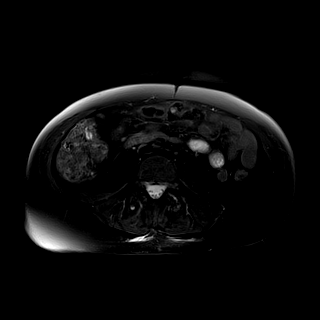
[im 30/30]
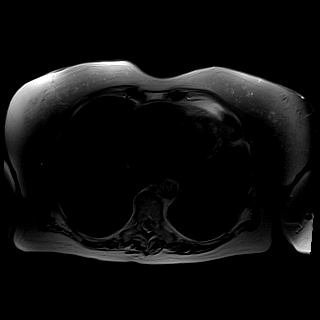

[Series 7: T1 · axial · 6.0mm · 0.74mm/px · z∈[-119,+83]mm · 2 of 29 slices shown (1 of 2)]
[im 1/29]
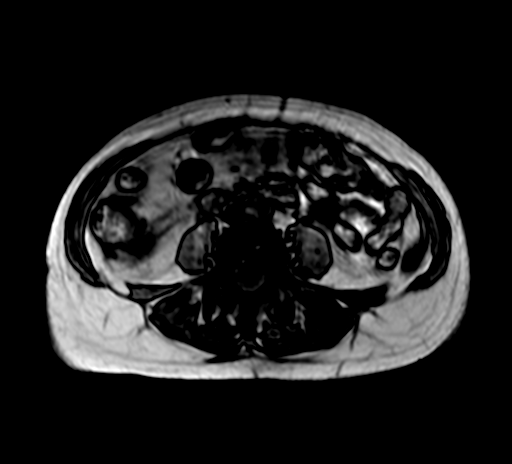
[im 29/29]
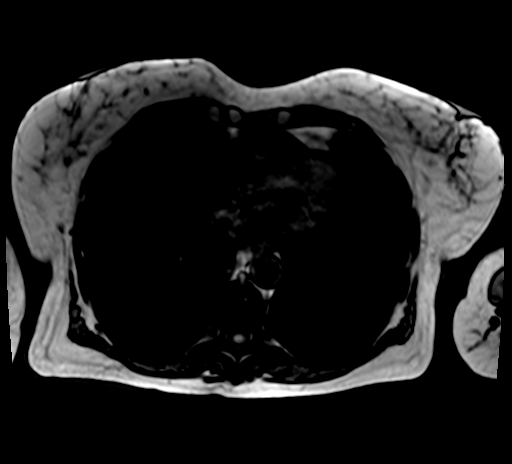

[Series 7: T1 · axial · 6.0mm · 0.74mm/px · z∈[-119,+83]mm · 2 of 29 slices shown (2 of 2)]
[im 1/29]
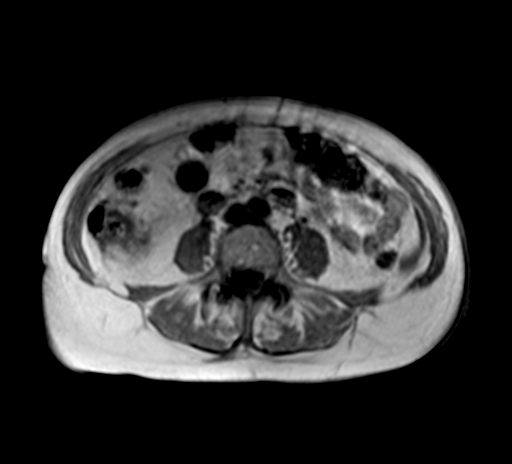
[im 29/29]
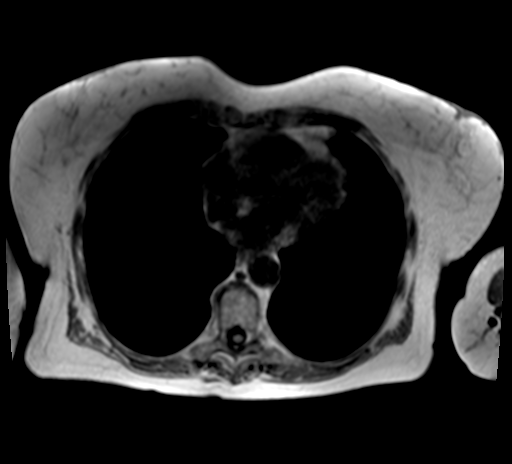

[Series 8: ax dwi_tracew · axial · 6.0mm · 1.42mm/px · z∈[-122,+87]mm · 2 of 30 slices shown (1 of 3)]
[im 1/30]
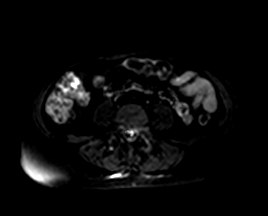
[im 30/30]
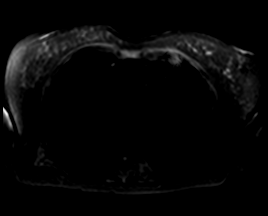

[Series 8: ax dwi_tracew · axial · 6.0mm · 1.42mm/px · z∈[-122,+87]mm · 2 of 30 slices shown (2 of 3)]
[im 1/30]
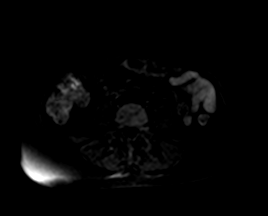
[im 30/30]
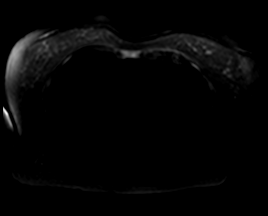

[Series 8: ax dwi_tracew · axial · 6.0mm · 1.42mm/px · z∈[-122,+87]mm · 2 of 30 slices shown (3 of 3)]
[im 1/30]
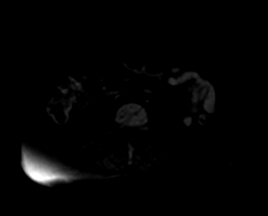
[im 30/30]
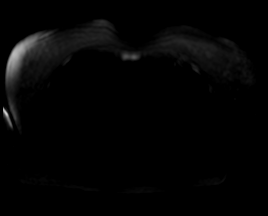

[Series 9: ax dwi_adc · axial · 6.0mm · 1.42mm/px · 1 of 30 slices shown]
[im 1/30]
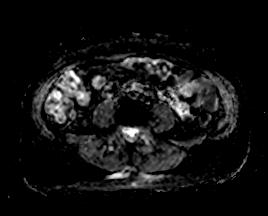

[Series 10: bSSFP · axial · 6.0mm · 0.74mm/px · 1 of 29 slices shown]
[im 1/29]
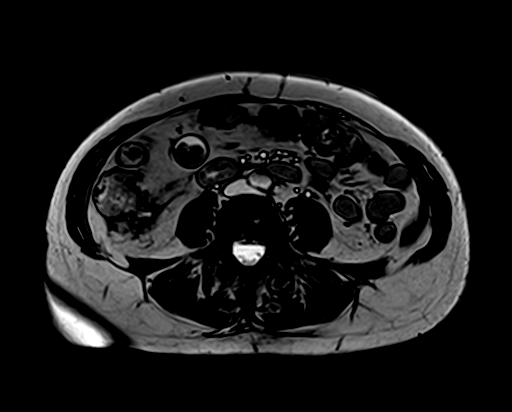

[Series 11: T1 dynamic fat-sat · axial · non-contrast · 3.0mm · 1.19mm/px · z∈[-124,+89]mm · 3 of 72 slices shown (1 of 5)]
[im 1/72]
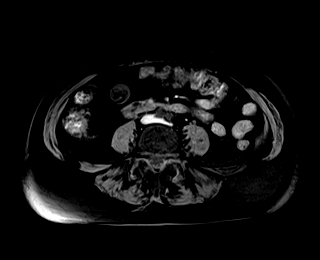
[im 36/72]
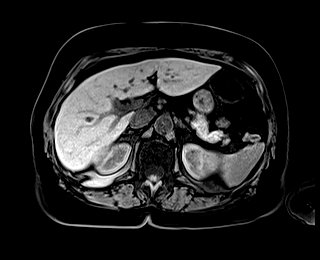
[im 72/72]
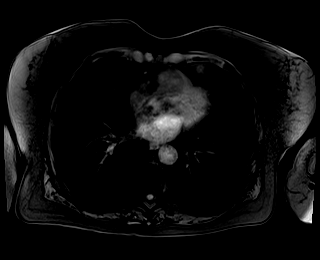

[Series 12: T1 dynamic fat-sat post-contrast · axial · 3.0mm · 1.19mm/px · z∈[-124,+89]mm · 3 of 72 slices shown (1 of 4)]
[im 1/72]
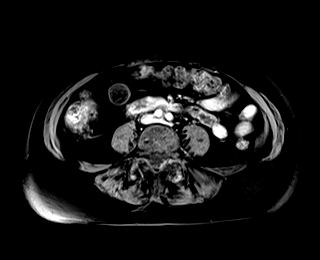
[im 36/72]
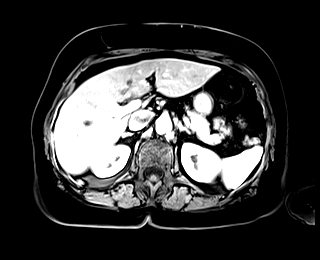
[im 72/72]
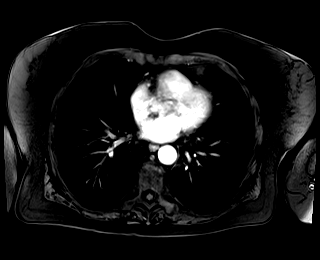

[Series 13: T1 dynamic fat-sat · axial · 3.0mm · 1.19mm/px · z∈[-124,+89]mm · 3 of 72 slices shown (2 of 5)]
[im 1/72]
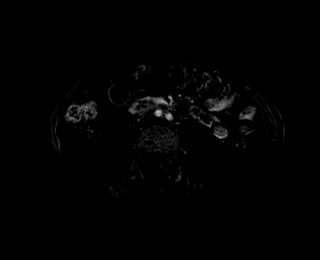
[im 36/72]
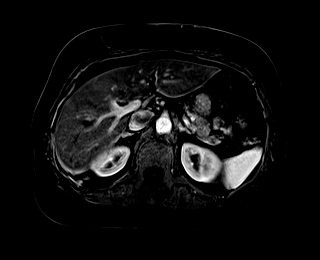
[im 72/72]
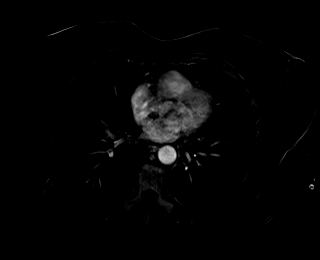

[Series 14: T1 dynamic fat-sat post-contrast · axial · 3.0mm · 1.19mm/px · z∈[-124,+89]mm · 3 of 72 slices shown (2 of 4)]
[im 1/72]
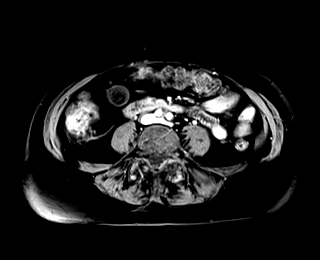
[im 36/72]
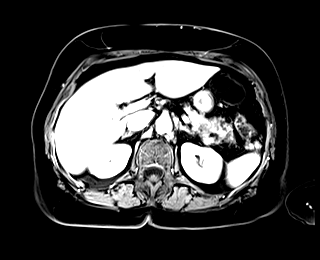
[im 72/72]
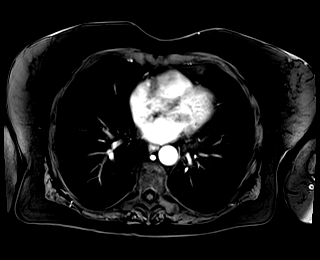

[Series 15: T1 dynamic fat-sat · axial · 3.0mm · 1.19mm/px · z∈[-124,+89]mm · 3 of 72 slices shown (3 of 5)]
[im 1/72]
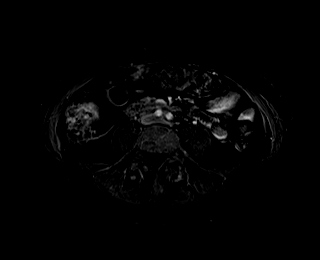
[im 36/72]
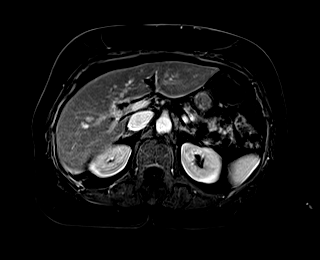
[im 72/72]
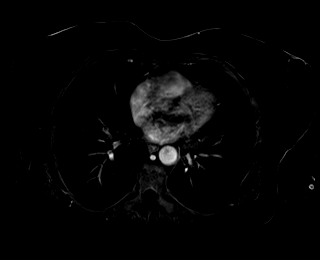

[Series 16: T1 dynamic fat-sat post-contrast · axial · 3.0mm · 1.19mm/px · z∈[-124,+89]mm · 3 of 72 slices shown (3 of 4)]
[im 1/72]
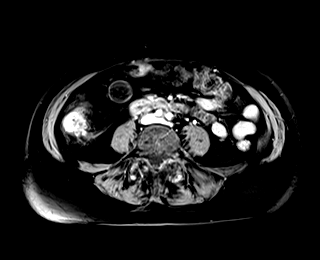
[im 36/72]
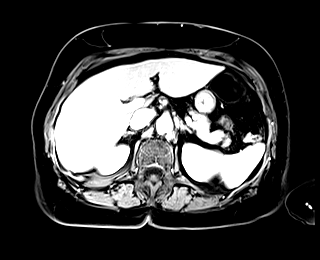
[im 72/72]
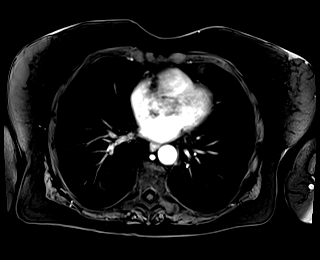

[Series 17: T1 dynamic fat-sat · axial · 3.0mm · 1.19mm/px · z∈[-124,+89]mm · 3 of 72 slices shown (4 of 5)]
[im 1/72]
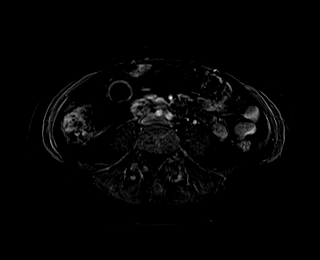
[im 36/72]
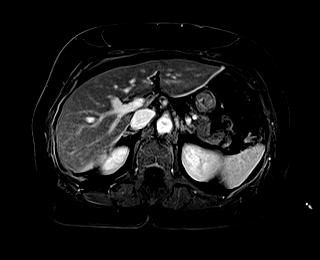
[im 72/72]
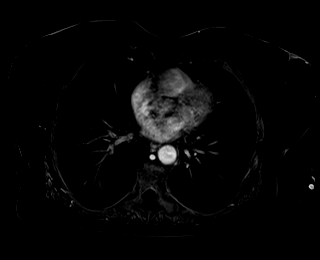

[Series 18: T1 dynamic post-contrast · coronal · 3.0mm · 1.31mm/px · 3 of 72 slices shown]
[im 1/72]
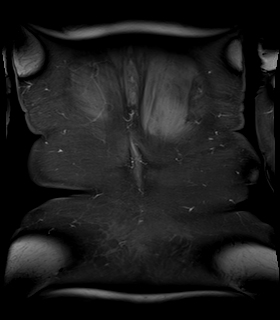
[im 36/72]
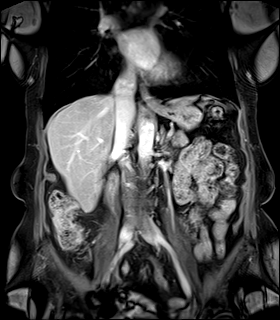
[im 72/72]
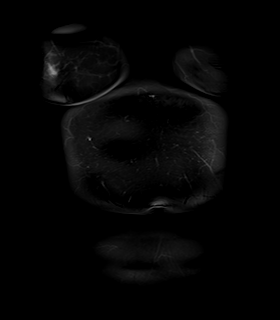

[Series 19: T1 dynamic fat-sat post-contrast · axial · 3.0mm · 1.19mm/px · z∈[-124,+89]mm · 3 of 72 slices shown (4 of 4)]
[im 1/72]
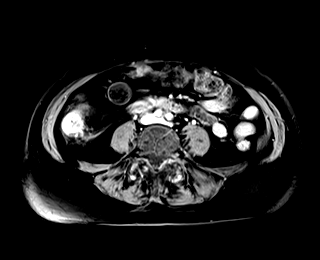
[im 36/72]
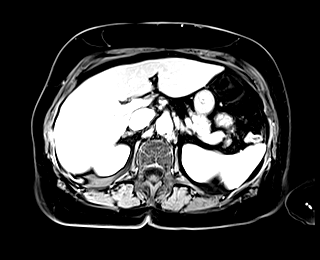
[im 72/72]
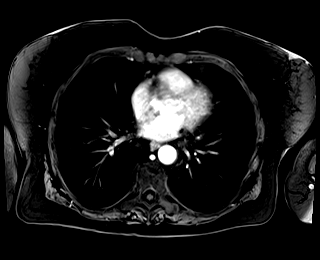

[Series 20: T1 dynamic fat-sat · axial · 3.0mm · 1.19mm/px · z∈[-124,-19]mm · 2 of 72 slices shown (5 of 5)]
[im 1/72]
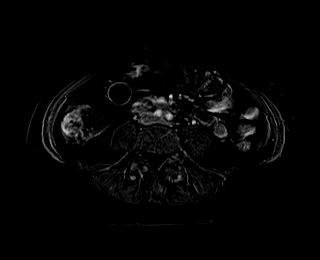
[im 36/72]
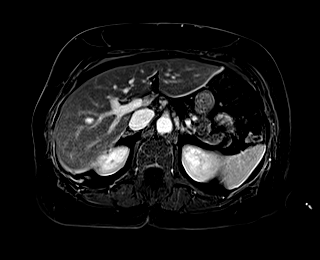

[47 of 48 positions shown; findings below may reference images not displayed]

FINDINGS: Lower chest: Lung bases are clear.

Hepatobiliary: 5 mm T2 hyperintense lesion in segment 2 (series
6/image 12), with peripheral enhancement following contrast
administration, likely reflecting a benign hemangioma. Technically
speaking, it is difficult to definitively characterized lesion of
this size.

Cholelithiasis, without associated inflammatory changes. No
intrahepatic or extrahepatic ductal dilatation.

Pancreas:  Within normal limits.

Spleen:  Within normal limits.

Adrenals/Urinary Tract:  Adrenal glands are within normal limits.

Kidneys are within normal limits.  No hydronephrosis.

Stomach/Bowel: Stomach is within normal limits.

Visualized bowel is unremarkable.

Vascular/Lymphatic:  No evidence of abdominal aortic aneurysm.

No suspicious abdominal lymphadenopathy.

Other:  No abdominal ascites.

Musculoskeletal: No focal osseous lesions.
IMPRESSION: 5 mm lesion in segment 2 is technically too small to characterize
but likely reflects a benign hemangioma.

No findings suspicious for metastatic disease.

## 2019-11-01 IMAGING — DX DG CHEST 1V PORT
1 series · 1 of 1 positions shown · non-contrast
Comparison: Chest CT 06/17/2018

CLINICAL DATA: Port-A-Cath placement.

EXAM:
PORTABLE CHEST 1 VIEW

[chest ap]
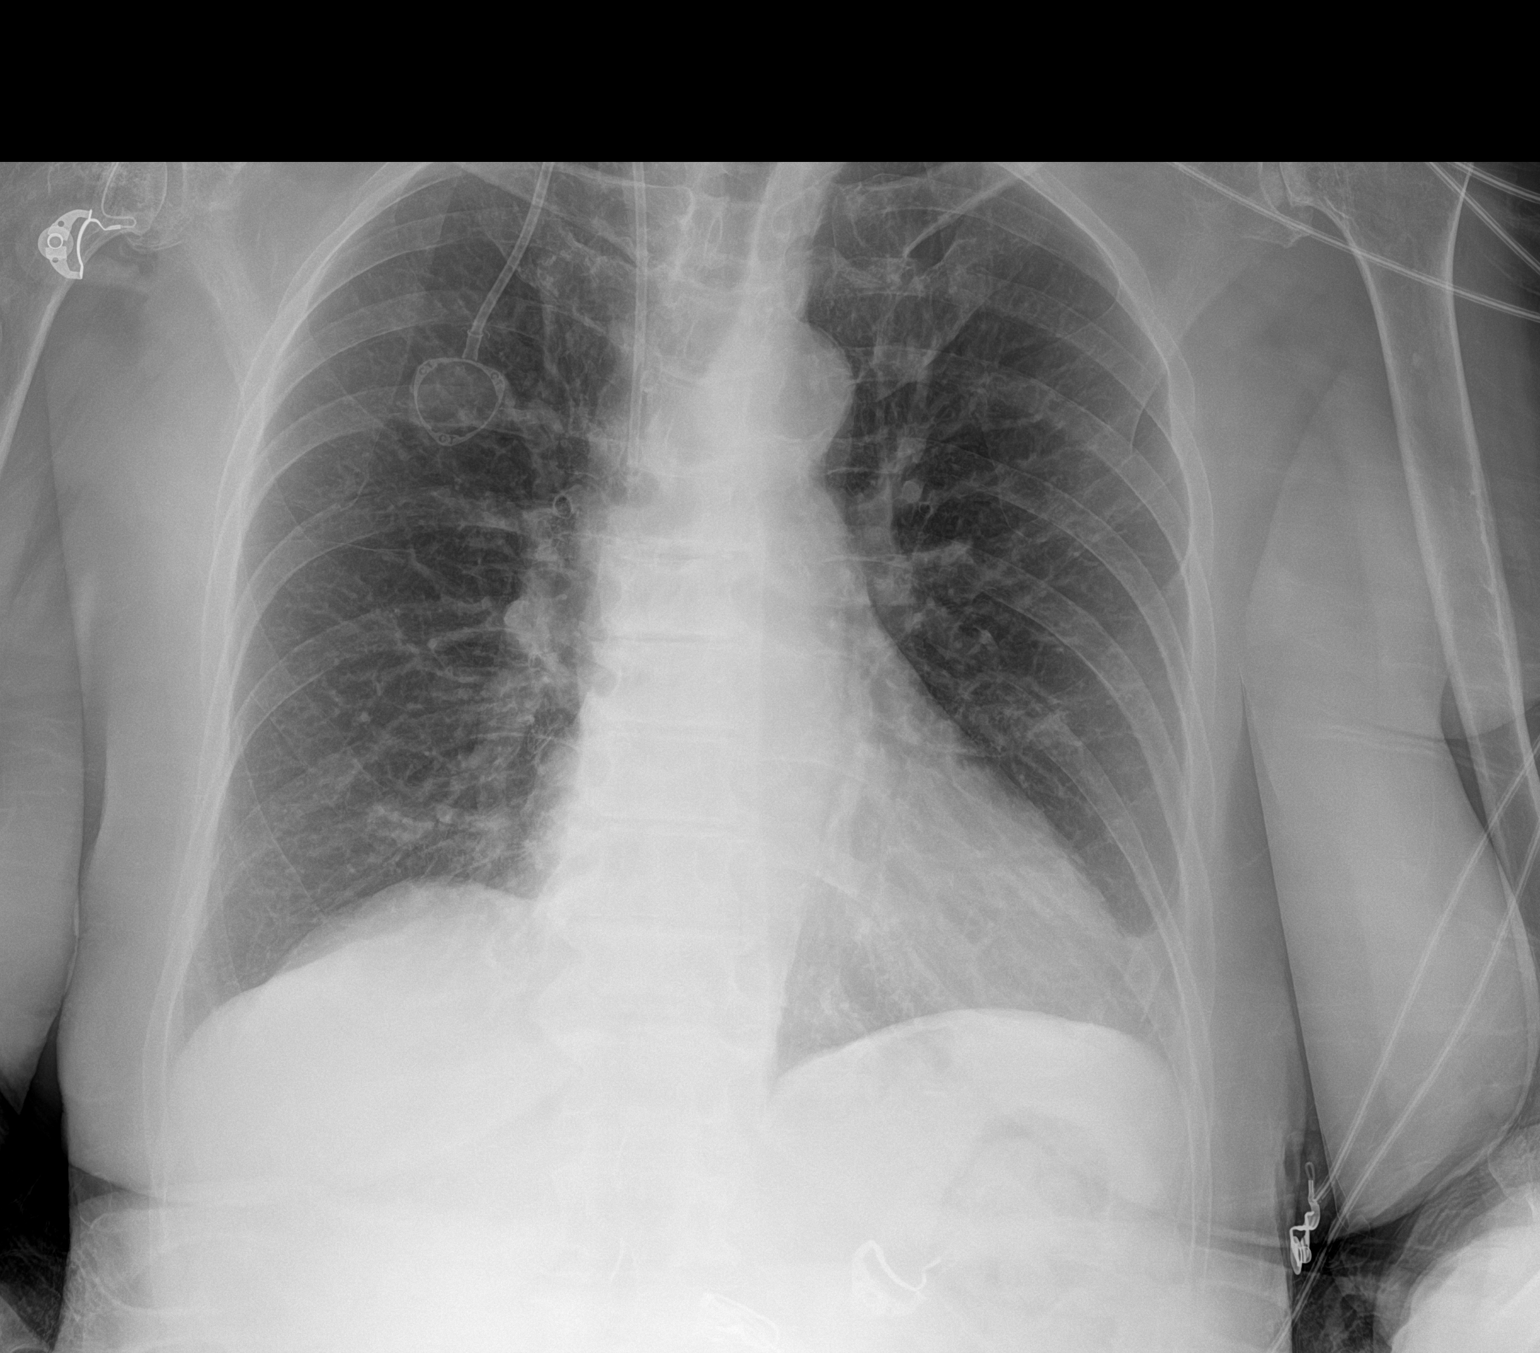

[1 of 1 positions shown; findings below may reference images not displayed]

FINDINGS: Right internal jugular approach injectable port terminates in the
expected location of mid superior vena cava.

Cardiomediastinal silhouette is normal. Mediastinal contours appear
intact. Calcific atherosclerotic disease of the aorta.

There is no evidence of focal airspace consolidation, pleural
effusion or pneumothorax.

Osseous structures are without acute abnormality. Soft tissues are
grossly normal.
IMPRESSION: Post right internal jugular approach injectable port placement.

No evidence of pneumothorax.

## 2019-12-20 DEATH — deceased
